# Patient Record
Sex: Female | Born: 1950 | Race: White | Hispanic: No | Marital: Married | Smoking: Current every day smoker
Health system: Southern US, Community
[De-identification: ages and names within clinical notes are randomized; demographics above are authoritative.]

## PROBLEM LIST (undated history)

## (undated) DIAGNOSIS — M1990 Unspecified osteoarthritis, unspecified site: Secondary | ICD-10-CM

## (undated) DIAGNOSIS — E079 Disorder of thyroid, unspecified: Secondary | ICD-10-CM

## (undated) DIAGNOSIS — C50919 Malignant neoplasm of unspecified site of unspecified female breast: Secondary | ICD-10-CM

## (undated) DIAGNOSIS — Z923 Personal history of irradiation: Secondary | ICD-10-CM

## (undated) DIAGNOSIS — H16209 Unspecified keratoconjunctivitis, unspecified eye: Secondary | ICD-10-CM

## (undated) DIAGNOSIS — F419 Anxiety disorder, unspecified: Secondary | ICD-10-CM

## (undated) DIAGNOSIS — J449 Chronic obstructive pulmonary disease, unspecified: Secondary | ICD-10-CM

## (undated) DIAGNOSIS — J329 Chronic sinusitis, unspecified: Secondary | ICD-10-CM

## (undated) DIAGNOSIS — C801 Malignant (primary) neoplasm, unspecified: Secondary | ICD-10-CM

## (undated) DIAGNOSIS — E039 Hypothyroidism, unspecified: Secondary | ICD-10-CM

## (undated) DIAGNOSIS — F329 Major depressive disorder, single episode, unspecified: Secondary | ICD-10-CM

## (undated) DIAGNOSIS — F32A Depression, unspecified: Secondary | ICD-10-CM

## (undated) HISTORY — DX: Malignant neoplasm of unspecified site of unspecified female breast: C50.919

## (undated) HISTORY — DX: Chronic obstructive pulmonary disease, unspecified: J44.9

## (undated) HISTORY — PX: ABDOMINAL HYSTERECTOMY: SHX81

## (undated) HISTORY — PX: TOTAL HIP ARTHROPLASTY: SHX124

## (undated) HISTORY — DX: Anxiety disorder, unspecified: F41.9

## (undated) HISTORY — DX: Unspecified osteoarthritis, unspecified site: M19.90

## (undated) HISTORY — PX: BREAST SURGERY: SHX581

## (undated) HISTORY — DX: Chronic sinusitis, unspecified: J32.9

## (undated) HISTORY — DX: Major depressive disorder, single episode, unspecified: F32.9

## (undated) HISTORY — DX: Depression, unspecified: F32.A

## (undated) HISTORY — PX: ECTOPIC PREGNANCY SURGERY: SHX613

## (undated) HISTORY — DX: Unspecified keratoconjunctivitis, unspecified eye: H16.209

---

## 2001-10-24 ENCOUNTER — Encounter: Admission: RE | Admit: 2001-10-24 | Discharge: 2001-10-24 | Payer: Self-pay | Admitting: Internal Medicine

## 2001-10-24 ENCOUNTER — Encounter: Payer: Self-pay | Admitting: Internal Medicine

## 2001-10-31 ENCOUNTER — Ambulatory Visit (HOSPITAL_COMMUNITY): Admission: RE | Admit: 2001-10-31 | Discharge: 2001-10-31 | Payer: Self-pay | Admitting: Internal Medicine

## 2001-10-31 ENCOUNTER — Encounter: Payer: Self-pay | Admitting: Internal Medicine

## 2001-11-06 ENCOUNTER — Encounter: Payer: Self-pay | Admitting: Internal Medicine

## 2001-11-06 ENCOUNTER — Ambulatory Visit (HOSPITAL_COMMUNITY): Admission: RE | Admit: 2001-11-06 | Discharge: 2001-11-06 | Payer: Self-pay | Admitting: Internal Medicine

## 2001-11-09 ENCOUNTER — Encounter: Payer: Self-pay | Admitting: Internal Medicine

## 2001-11-09 ENCOUNTER — Encounter: Admission: RE | Admit: 2001-11-09 | Discharge: 2001-11-09 | Payer: Self-pay | Admitting: Internal Medicine

## 2002-05-22 ENCOUNTER — Encounter: Payer: Self-pay | Admitting: Internal Medicine

## 2002-05-22 ENCOUNTER — Ambulatory Visit (HOSPITAL_COMMUNITY): Admission: RE | Admit: 2002-05-22 | Discharge: 2002-05-22 | Payer: Self-pay | Admitting: Internal Medicine

## 2002-07-17 ENCOUNTER — Ambulatory Visit (HOSPITAL_COMMUNITY): Admission: RE | Admit: 2002-07-17 | Discharge: 2002-07-17 | Payer: Self-pay | Admitting: Internal Medicine

## 2002-07-17 ENCOUNTER — Encounter: Payer: Self-pay | Admitting: Internal Medicine

## 2003-08-19 ENCOUNTER — Encounter: Admission: RE | Admit: 2003-08-19 | Discharge: 2003-08-19 | Payer: Self-pay | Admitting: Internal Medicine

## 2003-12-14 ENCOUNTER — Emergency Department (HOSPITAL_COMMUNITY): Admission: EM | Admit: 2003-12-14 | Discharge: 2003-12-14 | Payer: Self-pay | Admitting: Emergency Medicine

## 2004-09-14 ENCOUNTER — Emergency Department (HOSPITAL_COMMUNITY): Admission: EM | Admit: 2004-09-14 | Discharge: 2004-09-15 | Payer: Self-pay | Admitting: Emergency Medicine

## 2004-10-07 ENCOUNTER — Encounter: Admission: RE | Admit: 2004-10-07 | Discharge: 2004-10-07 | Payer: Self-pay | Admitting: Internal Medicine

## 2004-12-17 ENCOUNTER — Ambulatory Visit (HOSPITAL_COMMUNITY): Admission: RE | Admit: 2004-12-17 | Discharge: 2004-12-17 | Payer: Self-pay | Admitting: Orthopedic Surgery

## 2004-12-24 ENCOUNTER — Encounter: Admission: RE | Admit: 2004-12-24 | Discharge: 2004-12-24 | Payer: Self-pay | Admitting: Orthopedic Surgery

## 2005-08-30 ENCOUNTER — Encounter: Admission: RE | Admit: 2005-08-30 | Discharge: 2005-08-30 | Payer: Self-pay | Admitting: Orthopedic Surgery

## 2006-09-11 ENCOUNTER — Emergency Department (HOSPITAL_COMMUNITY): Admission: EM | Admit: 2006-09-11 | Discharge: 2006-09-11 | Payer: Self-pay | Admitting: Emergency Medicine

## 2006-10-12 ENCOUNTER — Ambulatory Visit (HOSPITAL_COMMUNITY): Admission: RE | Admit: 2006-10-12 | Discharge: 2006-10-12 | Payer: Self-pay | Admitting: Internal Medicine

## 2006-11-04 ENCOUNTER — Encounter (INDEPENDENT_AMBULATORY_CARE_PROVIDER_SITE_OTHER): Payer: Self-pay | Admitting: Diagnostic Radiology

## 2006-11-04 ENCOUNTER — Encounter: Admission: RE | Admit: 2006-11-04 | Discharge: 2006-11-04 | Payer: Self-pay | Admitting: Obstetrics and Gynecology

## 2006-11-13 ENCOUNTER — Encounter: Admission: RE | Admit: 2006-11-13 | Discharge: 2006-11-13 | Payer: Self-pay | Admitting: Obstetrics and Gynecology

## 2006-11-23 ENCOUNTER — Encounter: Admission: RE | Admit: 2006-11-23 | Discharge: 2006-11-23 | Payer: Self-pay | Admitting: Surgery

## 2006-11-25 ENCOUNTER — Encounter (INDEPENDENT_AMBULATORY_CARE_PROVIDER_SITE_OTHER): Payer: Self-pay | Admitting: Surgery

## 2006-11-25 ENCOUNTER — Encounter: Admission: RE | Admit: 2006-11-25 | Discharge: 2006-11-25 | Payer: Self-pay | Admitting: Surgery

## 2006-11-25 ENCOUNTER — Ambulatory Visit (HOSPITAL_BASED_OUTPATIENT_CLINIC_OR_DEPARTMENT_OTHER): Admission: RE | Admit: 2006-11-25 | Discharge: 2006-11-25 | Payer: Self-pay | Admitting: Surgery

## 2006-11-25 HISTORY — PX: BREAST LUMPECTOMY: SHX2

## 2006-12-07 ENCOUNTER — Ambulatory Visit: Payer: Self-pay | Admitting: Hematology and Oncology

## 2006-12-13 ENCOUNTER — Ambulatory Visit: Admission: RE | Admit: 2006-12-13 | Discharge: 2007-03-10 | Payer: Self-pay | Admitting: Radiation Oncology

## 2006-12-16 LAB — COMPREHENSIVE METABOLIC PANEL
ALT: 19 U/L (ref 0–35)
AST: 17 U/L (ref 0–37)
Albumin: 4.2 g/dL (ref 3.5–5.2)
Alkaline Phosphatase: 119 U/L — ABNORMAL HIGH (ref 39–117)
Glucose, Bld: 105 mg/dL — ABNORMAL HIGH (ref 70–99)
Potassium: 4.4 mEq/L (ref 3.5–5.3)
Sodium: 140 mEq/L (ref 135–145)
Total Bilirubin: 0.3 mg/dL (ref 0.3–1.2)
Total Protein: 6.8 g/dL (ref 6.0–8.3)

## 2006-12-16 LAB — CBC WITH DIFFERENTIAL/PLATELET
BASO%: 0.5 % (ref 0.0–2.0)
EOS%: 0 % (ref 0.0–7.0)
Eosinophils Absolute: 0 10*3/uL (ref 0.0–0.5)
LYMPH%: 28.9 % (ref 14.0–48.0)
MCHC: 35.2 g/dL (ref 32.0–36.0)
MCV: 88.3 fL (ref 81.0–101.0)
MONO%: 9.1 % (ref 0.0–13.0)
NEUT#: 3.9 10*3/uL (ref 1.5–6.5)
Platelets: 221 10*3/uL (ref 145–400)
RBC: 4.43 10*6/uL (ref 3.70–5.32)
RDW: 13.6 % (ref 11.3–14.5)

## 2006-12-22 ENCOUNTER — Ambulatory Visit (HOSPITAL_COMMUNITY): Admission: RE | Admit: 2006-12-22 | Discharge: 2006-12-22 | Payer: Self-pay | Admitting: Hematology and Oncology

## 2007-04-12 ENCOUNTER — Ambulatory Visit: Payer: Self-pay | Admitting: Hematology and Oncology

## 2007-04-14 LAB — CBC WITH DIFFERENTIAL/PLATELET
BASO%: 0.4 % (ref 0.0–2.0)
Basophils Absolute: 0 10*3/uL (ref 0.0–0.1)
EOS%: 0 % (ref 0.0–7.0)
HCT: 41 % (ref 34.8–46.6)
HGB: 13.9 g/dL (ref 11.6–15.9)
LYMPH%: 24.7 % (ref 14.0–48.0)
MCH: 30.3 pg (ref 26.0–34.0)
MCHC: 33.9 g/dL (ref 32.0–36.0)
NEUT%: 66.7 % (ref 39.6–76.8)
Platelets: 218 10*3/uL (ref 145–400)
lymph#: 1.3 10*3/uL (ref 0.9–3.3)

## 2007-04-14 LAB — COMPREHENSIVE METABOLIC PANEL
ALT: <8 U/L (ref 0–35)
AST: 10 U/L (ref 0–37)
BUN: 15 mg/dL (ref 6–23)
CO2: 27 mEq/L (ref 19–32)
Calcium: 8.9 mg/dL (ref 8.4–10.5)
Chloride: 105 mEq/L (ref 96–112)
Creatinine, Ser: 0.64 mg/dL (ref 0.40–1.20)
Total Bilirubin: 0.3 mg/dL (ref 0.3–1.2)

## 2007-04-14 LAB — LACTATE DEHYDROGENASE: LDH: 149 U/L (ref 94–250)

## 2007-05-23 ENCOUNTER — Emergency Department (HOSPITAL_COMMUNITY): Admission: EM | Admit: 2007-05-23 | Discharge: 2007-05-23 | Payer: Self-pay | Admitting: Family Medicine

## 2007-07-11 ENCOUNTER — Ambulatory Visit: Payer: Self-pay | Admitting: Hematology and Oncology

## 2007-07-13 LAB — CBC WITH DIFFERENTIAL/PLATELET
BASO%: 0.2 % (ref 0.0–2.0)
Eosinophils Absolute: 0 10*3/uL (ref 0.0–0.5)
MCHC: 34.3 g/dL (ref 32.0–36.0)
MONO#: 0.5 10*3/uL (ref 0.1–0.9)
MONO%: 8.1 % (ref 0.0–13.0)
NEUT#: 4.5 10*3/uL (ref 1.5–6.5)
RBC: 4.67 10*6/uL (ref 3.70–5.32)
RDW: 14.2 % (ref 11.3–14.5)
WBC: 6.2 10*3/uL (ref 3.9–10.0)

## 2007-07-13 LAB — COMPREHENSIVE METABOLIC PANEL
ALT: 10 U/L (ref 0–35)
Albumin: 4.1 g/dL (ref 3.5–5.2)
Alkaline Phosphatase: 91 U/L (ref 39–117)
CO2: 26 mEq/L (ref 19–32)
Glucose, Bld: 94 mg/dL (ref 70–99)
Potassium: 3.5 mEq/L (ref 3.5–5.3)
Sodium: 141 mEq/L (ref 135–145)
Total Protein: 6.9 g/dL (ref 6.0–8.3)

## 2007-07-26 ENCOUNTER — Emergency Department (HOSPITAL_COMMUNITY): Admission: EM | Admit: 2007-07-26 | Discharge: 2007-07-26 | Payer: Self-pay | Admitting: Family Medicine

## 2007-10-08 ENCOUNTER — Emergency Department (HOSPITAL_COMMUNITY): Admission: EM | Admit: 2007-10-08 | Discharge: 2007-10-08 | Payer: Self-pay | Admitting: Emergency Medicine

## 2007-10-13 ENCOUNTER — Encounter: Admission: RE | Admit: 2007-10-13 | Discharge: 2007-10-13 | Payer: Self-pay | Admitting: Surgery

## 2007-11-07 ENCOUNTER — Ambulatory Visit: Payer: Self-pay | Admitting: Hematology and Oncology

## 2008-01-16 ENCOUNTER — Ambulatory Visit: Payer: Self-pay | Admitting: Hematology and Oncology

## 2008-01-18 LAB — COMPREHENSIVE METABOLIC PANEL
Albumin: 4.3 g/dL (ref 3.5–5.2)
CO2: 23 mEq/L (ref 19–32)
Chloride: 107 mEq/L (ref 96–112)
Glucose, Bld: 102 mg/dL — ABNORMAL HIGH (ref 70–99)
Potassium: 4.2 mEq/L (ref 3.5–5.3)
Sodium: 140 mEq/L (ref 135–145)
Total Protein: 6.8 g/dL (ref 6.0–8.3)

## 2008-01-18 LAB — CBC WITH DIFFERENTIAL/PLATELET
Eosinophils Absolute: 0 10*3/uL (ref 0.0–0.5)
HCT: 40.4 % (ref 34.8–46.6)
HGB: 14 g/dL (ref 11.6–15.9)
MCH: 31.8 pg (ref 26.0–34.0)
MCHC: 34.6 g/dL (ref 32.0–36.0)
MCV: 91.7 fL (ref 81.0–101.0)
MONO#: 0.5 10*3/uL (ref 0.1–0.9)

## 2008-03-16 ENCOUNTER — Emergency Department (HOSPITAL_COMMUNITY): Admission: EM | Admit: 2008-03-16 | Discharge: 2008-03-16 | Payer: Self-pay | Admitting: Emergency Medicine

## 2008-05-16 ENCOUNTER — Encounter: Admission: RE | Admit: 2008-05-16 | Discharge: 2008-05-16 | Payer: Self-pay | Admitting: Internal Medicine

## 2008-07-15 ENCOUNTER — Ambulatory Visit: Payer: Self-pay | Admitting: Hematology and Oncology

## 2008-11-28 ENCOUNTER — Ambulatory Visit: Payer: Self-pay | Admitting: Oncology

## 2008-12-18 ENCOUNTER — Encounter: Admission: RE | Admit: 2008-12-18 | Discharge: 2008-12-18 | Payer: Self-pay | Admitting: Hematology and Oncology

## 2008-12-25 ENCOUNTER — Ambulatory Visit: Payer: Self-pay | Admitting: Oncology

## 2009-01-23 ENCOUNTER — Ambulatory Visit: Payer: Self-pay | Admitting: Oncology

## 2009-03-07 ENCOUNTER — Encounter: Payer: Self-pay | Admitting: Physician Assistant

## 2009-03-26 ENCOUNTER — Ambulatory Visit: Payer: Self-pay | Admitting: Oncology

## 2009-04-09 LAB — COMPREHENSIVE METABOLIC PANEL
ALT: 12 U/L (ref 0–35)
AST: 20 U/L (ref 0–37)
Alkaline Phosphatase: 78 U/L (ref 39–117)
Potassium: 3.5 mEq/L (ref 3.5–5.3)
Sodium: 137 mEq/L (ref 135–145)
Total Bilirubin: 0.2 mg/dL — ABNORMAL LOW (ref 0.3–1.2)
Total Protein: 6.4 g/dL (ref 6.0–8.3)

## 2009-04-09 LAB — CBC WITH DIFFERENTIAL/PLATELET
BASO%: 0.3 % (ref 0.0–2.0)
LYMPH%: 27.7 % (ref 14.0–49.7)
MCHC: 34.3 g/dL (ref 31.5–36.0)
MCV: 91.1 fL (ref 79.5–101.0)
MONO%: 7.6 % (ref 0.0–14.0)
Platelets: 175 10*3/uL (ref 145–400)
RBC: 4.22 10*6/uL (ref 3.70–5.45)
RDW: 13.1 % (ref 11.2–14.5)
WBC: 5.7 10*3/uL (ref 3.9–10.3)

## 2009-05-20 ENCOUNTER — Ambulatory Visit: Payer: Self-pay | Admitting: Physician Assistant

## 2009-05-20 DIAGNOSIS — F411 Generalized anxiety disorder: Secondary | ICD-10-CM | POA: Insufficient documentation

## 2009-05-20 DIAGNOSIS — M169 Osteoarthritis of hip, unspecified: Secondary | ICD-10-CM | POA: Insufficient documentation

## 2009-05-20 DIAGNOSIS — J441 Chronic obstructive pulmonary disease with (acute) exacerbation: Secondary | ICD-10-CM | POA: Insufficient documentation

## 2009-05-20 DIAGNOSIS — E785 Hyperlipidemia, unspecified: Secondary | ICD-10-CM | POA: Insufficient documentation

## 2009-05-20 DIAGNOSIS — E018 Other iodine-deficiency related thyroid disorders and allied conditions: Secondary | ICD-10-CM | POA: Insufficient documentation

## 2009-05-20 LAB — CONVERTED CEMR LAB
Alkaline Phosphatase: 78 units/L (ref 39–117)
Basophils Absolute: 0 10*3/uL (ref 0.0–0.1)
Creatinine, Ser: 0.59 mg/dL (ref 0.40–1.20)
Eosinophils Absolute: 0 10*3/uL (ref 0.0–0.7)
Lymphocytes Relative: 25 % (ref 12–46)
MCV: 90.4 fL (ref 78.0–100.0)
Monocytes Absolute: 0.8 10*3/uL (ref 0.1–1.0)
Monocytes Relative: 10 % (ref 3–12)
Neutrophils Relative %: 65 % (ref 43–77)
Platelets: 249 10*3/uL (ref 150–400)
RBC: 4.79 M/uL (ref 3.87–5.11)
RDW: 13.5 % (ref 11.5–15.5)
Sodium: 145 meq/L (ref 135–145)
TSH: 0.05 microintl units/mL — ABNORMAL LOW (ref 0.350–4.500)

## 2009-05-21 ENCOUNTER — Encounter: Payer: Self-pay | Admitting: Physician Assistant

## 2009-05-21 LAB — CONVERTED CEMR LAB
Amphetamine Screen, Ur: NEGATIVE
Benzodiazepines.: NEGATIVE
Cocaine Metabolites: NEGATIVE
Creatinine,U: 92.7 mg/dL
Phencyclidine (PCP): NEGATIVE

## 2009-05-22 ENCOUNTER — Telehealth: Payer: Self-pay | Admitting: Physician Assistant

## 2009-05-23 ENCOUNTER — Encounter: Payer: Self-pay | Admitting: Physician Assistant

## 2009-05-23 ENCOUNTER — Ambulatory Visit (HOSPITAL_COMMUNITY): Admission: RE | Admit: 2009-05-23 | Discharge: 2009-05-23 | Payer: Self-pay | Admitting: Physician Assistant

## 2009-05-27 ENCOUNTER — Encounter: Payer: Self-pay | Admitting: Physician Assistant

## 2009-06-04 ENCOUNTER — Ambulatory Visit: Payer: Self-pay | Admitting: Physician Assistant

## 2009-06-06 ENCOUNTER — Encounter: Payer: Self-pay | Admitting: Physician Assistant

## 2009-06-12 ENCOUNTER — Ambulatory Visit (HOSPITAL_COMMUNITY): Admission: RE | Admit: 2009-06-12 | Discharge: 2009-06-12 | Payer: Self-pay | Admitting: Internal Medicine

## 2009-06-12 ENCOUNTER — Ambulatory Visit: Payer: Self-pay | Admitting: Physician Assistant

## 2009-06-16 ENCOUNTER — Encounter: Payer: Self-pay | Admitting: Physician Assistant

## 2009-06-17 ENCOUNTER — Telehealth: Payer: Self-pay | Admitting: Physician Assistant

## 2009-06-17 ENCOUNTER — Encounter: Payer: Self-pay | Admitting: Physician Assistant

## 2009-06-19 ENCOUNTER — Telehealth: Payer: Self-pay | Admitting: Physician Assistant

## 2009-06-19 IMAGING — MG MM DIGITAL SCREENING BILAT
4 series · 4 of 4 positions shown · non-contrast
Comparison: none

DG SCREEN MAMMOGRAM BILATERAL
Bilateral CC and MLO view(s) were taken.

DIGITAL SCREENING MAMMOGRAM WITH CAD:
There is a fibroglandular pattern.  Microcalcifications are present in the right breast.  
Characterization with magnification views is recommended.  No mass or malignant type calcifications
are identified in the left breast.  Compared with prior studies.

[R CC]
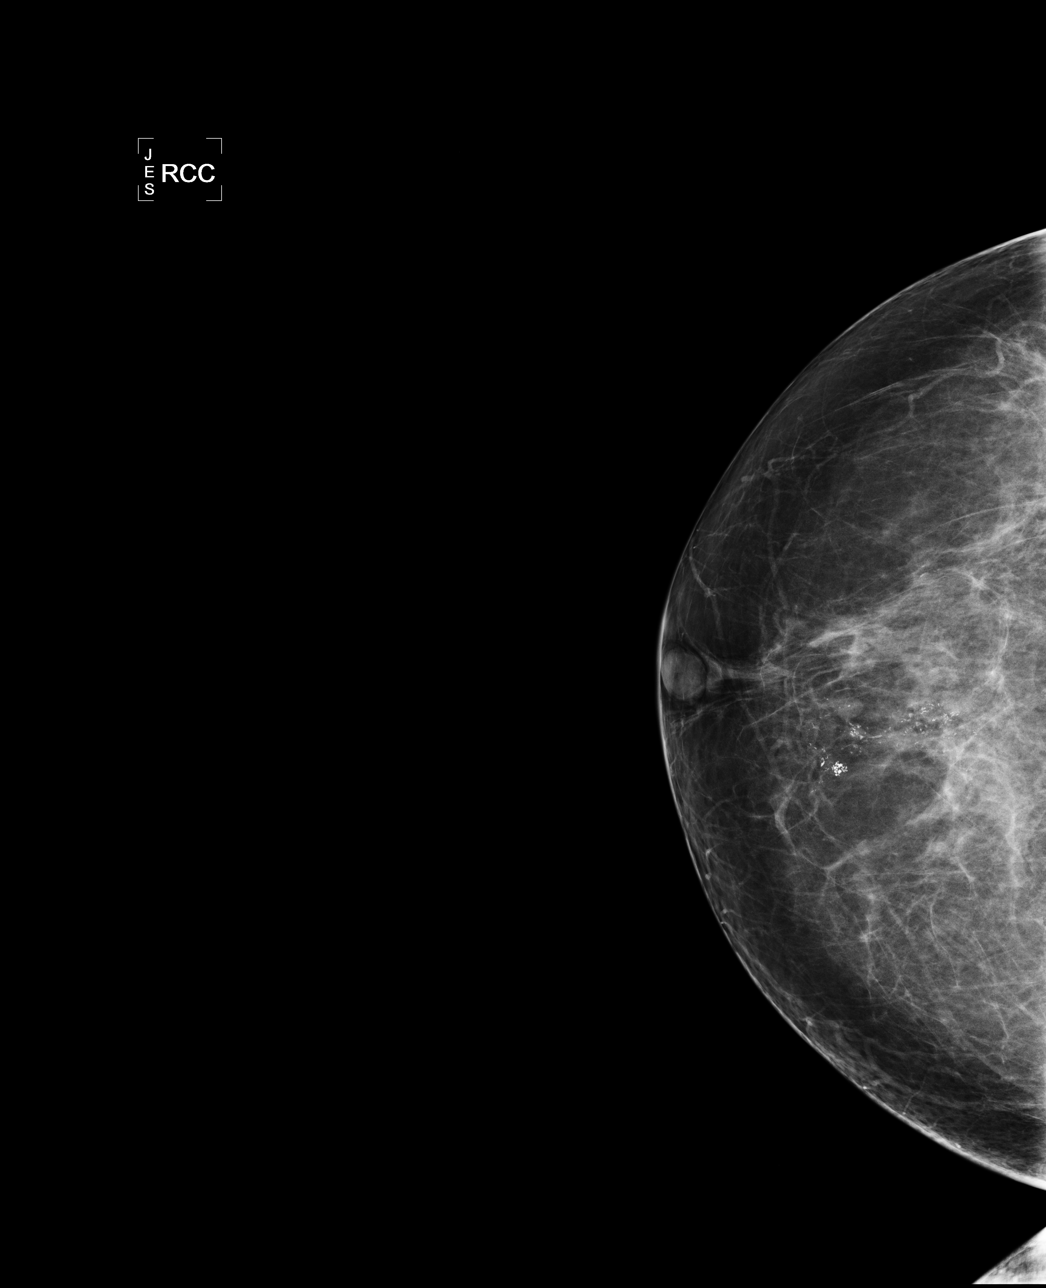

[R MLO]
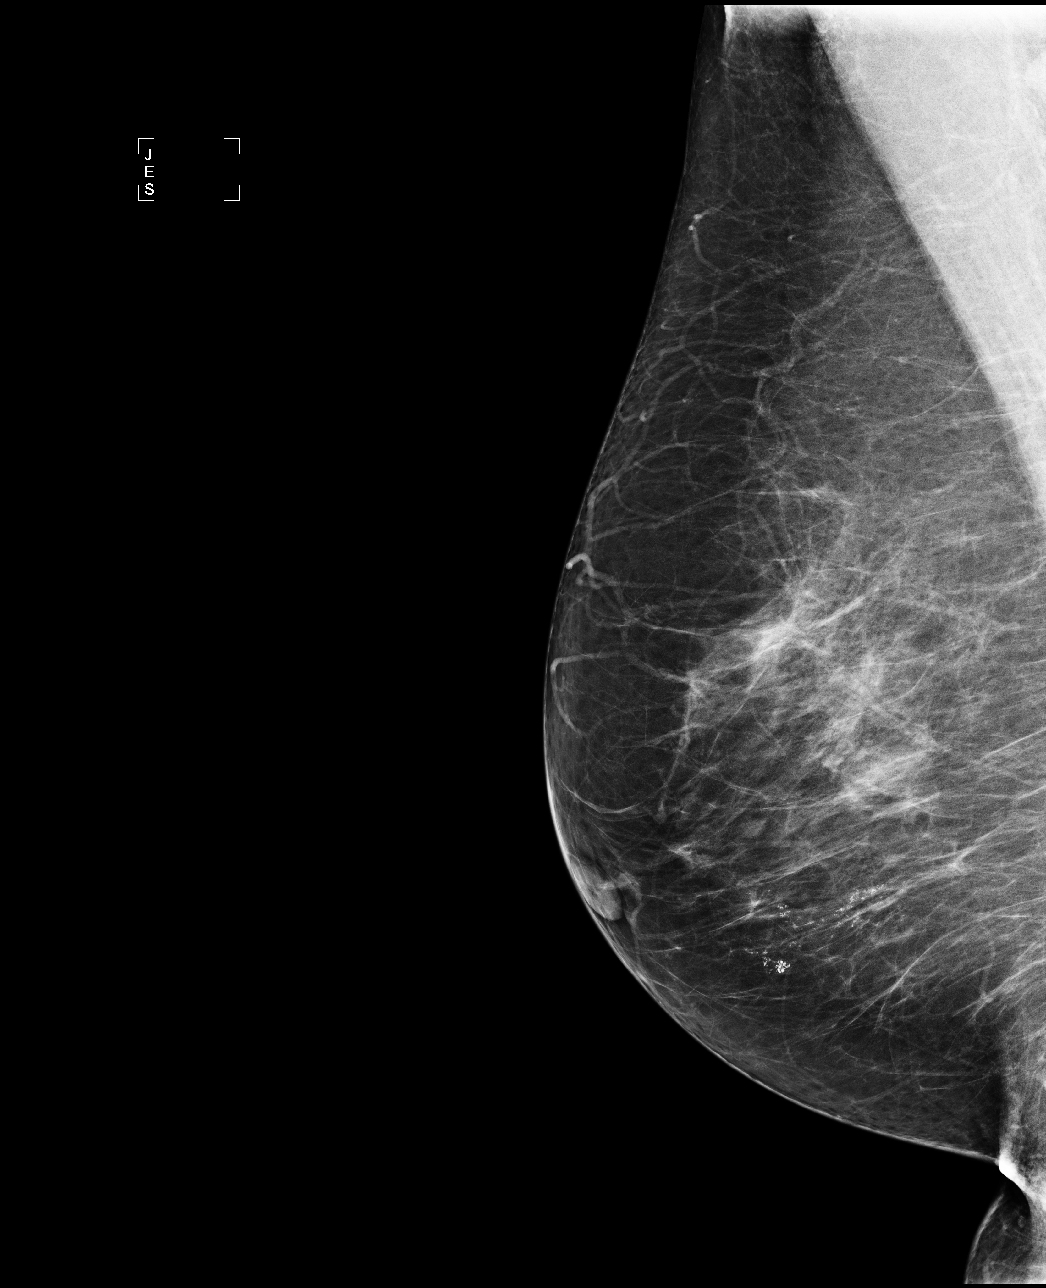

[L CC]
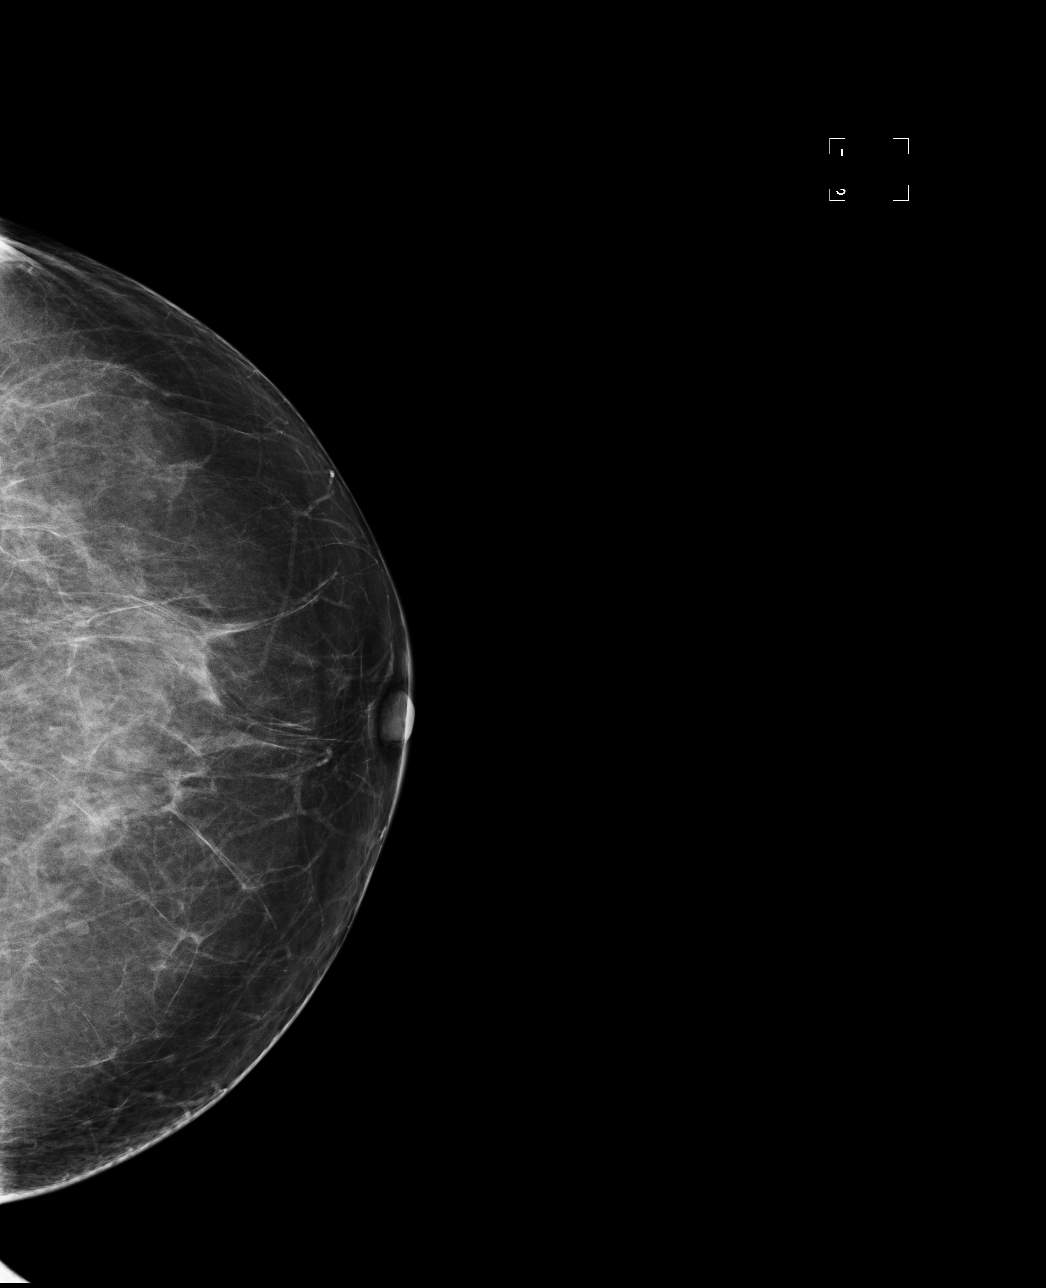

[L MLO]
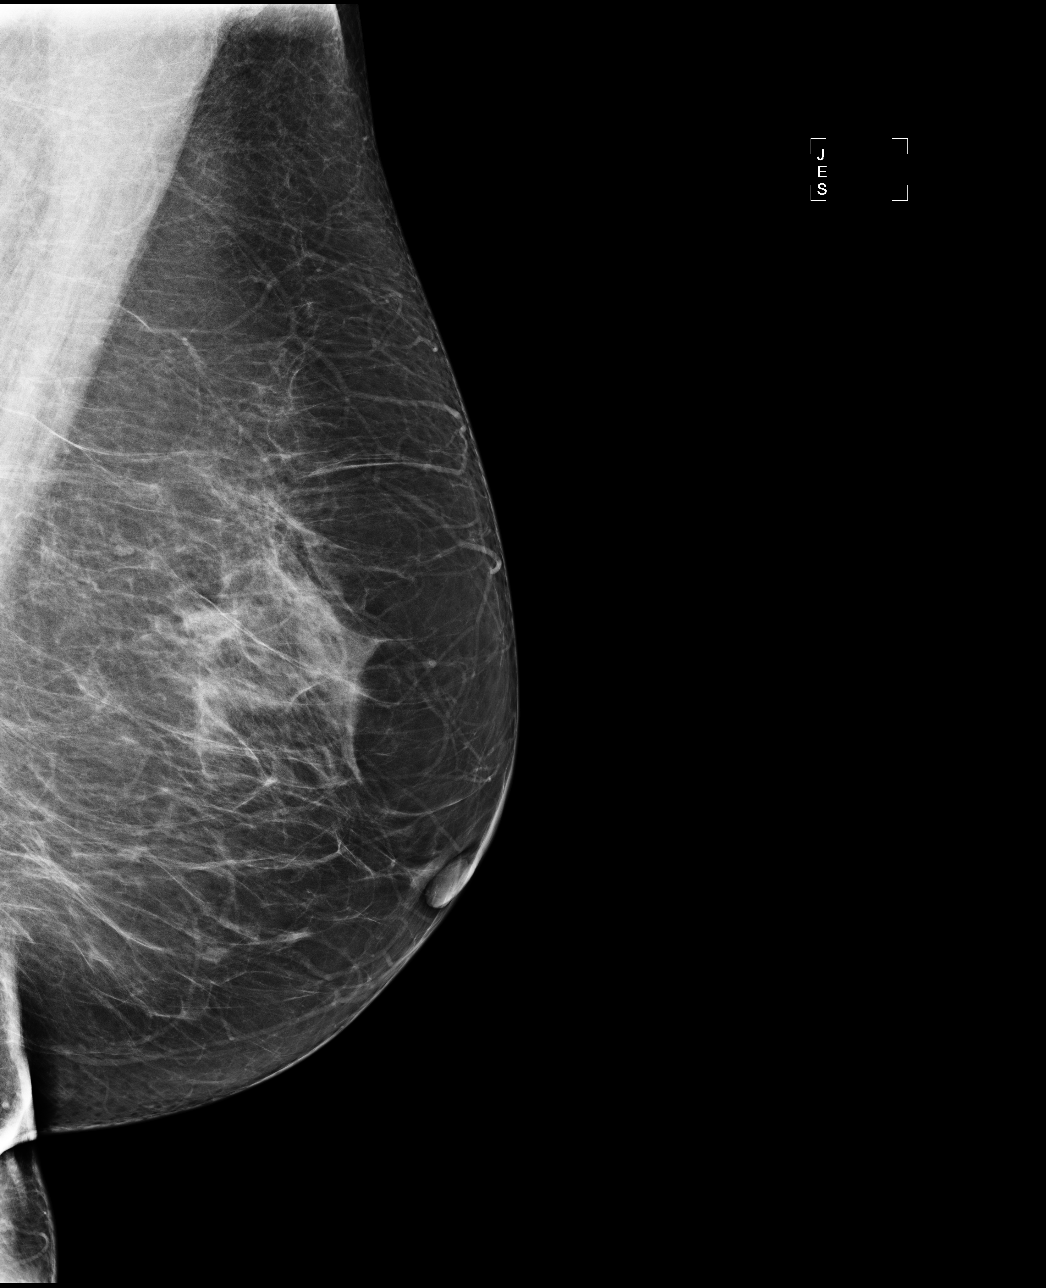

[4 of 4 positions shown; findings below may reference images not displayed]

IMPRESSION: Calcifications, right breast.  Additional evaluation is indicated. The patient will be contacted 
for additional studies and a supplementary report will follow.  No specific mammographic evidence 
of malignancy, left breast.

ASSESSMENT: Need additional imaging evaluation and/or prior mammograms for comparison - BI-RADS 0

Further imaging of the right breast.
ANALYZED BY COMPUTER AIDED DETECTION. , THIS PROCEDURE WAS A DIGITAL MAMMOGRAM.

## 2009-06-20 ENCOUNTER — Telehealth: Payer: Self-pay | Admitting: Physician Assistant

## 2009-06-27 ENCOUNTER — Encounter: Payer: Self-pay | Admitting: Physician Assistant

## 2009-07-07 ENCOUNTER — Telehealth: Payer: Self-pay | Admitting: Physician Assistant

## 2009-07-15 ENCOUNTER — Ambulatory Visit: Payer: Self-pay | Admitting: Physician Assistant

## 2009-07-15 ENCOUNTER — Other Ambulatory Visit: Admission: RE | Admit: 2009-07-15 | Discharge: 2009-07-15 | Payer: Self-pay | Admitting: Internal Medicine

## 2009-07-15 DIAGNOSIS — R82998 Other abnormal findings in urine: Secondary | ICD-10-CM | POA: Insufficient documentation

## 2009-07-15 LAB — CONVERTED CEMR LAB
Bilirubin Urine: NEGATIVE
KOH Prep: NEGATIVE
OCCULT 1: NEGATIVE
Pap Smear: NEGATIVE
Specific Gravity, Urine: 1.01
Urobilinogen, UA: 0.2

## 2009-07-16 ENCOUNTER — Encounter: Payer: Self-pay | Admitting: Physician Assistant

## 2009-07-16 LAB — CONVERTED CEMR LAB
Casts: NONE SEEN /lpf
Free T4: 1.02 ng/dL (ref 0.80–1.80)
Ketones, ur: NEGATIVE mg/dL
Specific Gravity, Urine: 1.012 (ref 1.005–1.030)
TSH: 0.084 microintl units/mL — ABNORMAL LOW (ref 0.350–4.500)
Urine Glucose: NEGATIVE mg/dL
pH: 6.5 (ref 5.0–8.0)

## 2009-07-17 ENCOUNTER — Telehealth: Payer: Self-pay | Admitting: Physician Assistant

## 2009-07-17 ENCOUNTER — Encounter: Payer: Self-pay | Admitting: Physician Assistant

## 2009-07-21 ENCOUNTER — Encounter: Payer: Self-pay | Admitting: Physician Assistant

## 2009-07-21 ENCOUNTER — Inpatient Hospital Stay (HOSPITAL_COMMUNITY): Admission: RE | Admit: 2009-07-21 | Discharge: 2009-07-23 | Payer: Self-pay | Admitting: Orthopedic Surgery

## 2009-07-25 ENCOUNTER — Encounter: Payer: Self-pay | Admitting: Physician Assistant

## 2009-08-13 ENCOUNTER — Telehealth: Payer: Self-pay | Admitting: Physician Assistant

## 2009-08-14 ENCOUNTER — Telehealth: Payer: Self-pay | Admitting: Physician Assistant

## 2009-08-21 ENCOUNTER — Telehealth: Payer: Self-pay | Admitting: Physician Assistant

## 2009-08-22 ENCOUNTER — Encounter: Payer: Self-pay | Admitting: Physician Assistant

## 2009-08-25 ENCOUNTER — Ambulatory Visit: Payer: Self-pay | Admitting: Physician Assistant

## 2009-08-27 ENCOUNTER — Encounter: Admission: RE | Admit: 2009-08-27 | Discharge: 2009-09-25 | Payer: Self-pay | Admitting: Orthopedic Surgery

## 2009-08-27 LAB — CONVERTED CEMR LAB
Cholesterol: 231 mg/dL — ABNORMAL HIGH (ref 0–200)
HDL goal, serum: 40 mg/dL
LDL Goal: 130 mg/dL
Triglycerides: 145 mg/dL (ref <150)
VLDL: 29 mg/dL (ref 0–40)

## 2009-09-18 ENCOUNTER — Telehealth: Payer: Self-pay | Admitting: Physician Assistant

## 2009-10-06 ENCOUNTER — Ambulatory Visit: Payer: Self-pay | Admitting: Oncology

## 2009-10-08 ENCOUNTER — Telehealth: Payer: Self-pay | Admitting: Physician Assistant

## 2009-10-08 LAB — COMPREHENSIVE METABOLIC PANEL
Albumin: 3.9 g/dL (ref 3.5–5.2)
BUN: 11 mg/dL (ref 6–23)
CO2: 25 mEq/L (ref 19–32)
Calcium: 9.2 mg/dL (ref 8.4–10.5)
Chloride: 104 mEq/L (ref 96–112)
Glucose, Bld: 96 mg/dL (ref 70–99)
Potassium: 3.8 mEq/L (ref 3.5–5.3)

## 2009-10-08 LAB — CBC WITH DIFFERENTIAL/PLATELET
Basophils Absolute: 0.1 10*3/uL (ref 0.0–0.1)
Eosinophils Absolute: 0 10*3/uL (ref 0.0–0.5)
HGB: 13.9 g/dL (ref 11.6–15.9)
MCV: 86.8 fL (ref 79.5–101.0)
MONO#: 0.8 10*3/uL (ref 0.1–0.9)
MONO%: 8.9 % (ref 0.0–14.0)
NEUT#: 5.7 10*3/uL (ref 1.5–6.5)
RDW: 14 % (ref 11.2–14.5)
lymph#: 2.1 10*3/uL (ref 0.9–3.3)

## 2009-10-08 LAB — CANCER ANTIGEN 27.29: CA 27.29: 10 U/mL (ref 0–39)

## 2009-10-09 ENCOUNTER — Telehealth: Payer: Self-pay | Admitting: Physician Assistant

## 2009-10-20 ENCOUNTER — Telehealth: Payer: Self-pay | Admitting: Physician Assistant

## 2009-10-27 ENCOUNTER — Telehealth: Payer: Self-pay | Admitting: Physician Assistant

## 2009-12-02 ENCOUNTER — Ambulatory Visit: Payer: Self-pay | Admitting: Physician Assistant

## 2009-12-02 DIAGNOSIS — L509 Urticaria, unspecified: Secondary | ICD-10-CM | POA: Insufficient documentation

## 2009-12-03 ENCOUNTER — Telehealth: Payer: Self-pay | Admitting: Physician Assistant

## 2009-12-03 ENCOUNTER — Encounter: Payer: Self-pay | Admitting: Physician Assistant

## 2009-12-03 LAB — CONVERTED CEMR LAB
ALT: <8 units/L (ref 0–35)
AST: 14 units/L (ref 0–37)
CO2: 27 meq/L (ref 19–32)
Calcium: 8.9 mg/dL (ref 8.4–10.5)
Chloride: 107 meq/L (ref 96–112)
Cholesterol: 175 mg/dL (ref 0–200)
Creatinine, Ser: 0.73 mg/dL (ref 0.40–1.20)
Free T4: 1.06 ng/dL (ref 0.80–1.80)
Sodium: 142 meq/L (ref 135–145)
T3, Free: 3.3 pg/mL (ref 2.3–4.2)
TSH: 1.216 microintl units/mL (ref 0.350–4.500)
Total CHOL/HDL Ratio: 3.7
Total Protein: 7.1 g/dL (ref 6.0–8.3)
VLDL: 23 mg/dL (ref 0–40)

## 2009-12-05 ENCOUNTER — Ambulatory Visit: Payer: Self-pay | Admitting: Internal Medicine

## 2009-12-05 DIAGNOSIS — R7309 Other abnormal glucose: Secondary | ICD-10-CM | POA: Insufficient documentation

## 2009-12-12 ENCOUNTER — Ambulatory Visit: Payer: Self-pay | Admitting: Physician Assistant

## 2009-12-22 ENCOUNTER — Emergency Department (HOSPITAL_COMMUNITY): Admission: EM | Admit: 2009-12-22 | Discharge: 2009-12-22 | Payer: Self-pay | Admitting: Family Medicine

## 2009-12-22 ENCOUNTER — Encounter: Admission: RE | Admit: 2009-12-22 | Discharge: 2009-12-22 | Payer: Self-pay | Admitting: Hematology and Oncology

## 2009-12-25 ENCOUNTER — Telehealth: Payer: Self-pay | Admitting: Physician Assistant

## 2009-12-25 ENCOUNTER — Ambulatory Visit: Payer: Self-pay | Admitting: Physician Assistant

## 2009-12-25 DIAGNOSIS — R51 Headache: Secondary | ICD-10-CM | POA: Insufficient documentation

## 2009-12-25 DIAGNOSIS — R519 Headache, unspecified: Secondary | ICD-10-CM | POA: Insufficient documentation

## 2009-12-29 ENCOUNTER — Encounter: Payer: Self-pay | Admitting: Physician Assistant

## 2010-01-27 ENCOUNTER — Encounter: Payer: Self-pay | Admitting: Physician Assistant

## 2010-02-11 ENCOUNTER — Telehealth: Payer: Self-pay | Admitting: Physician Assistant

## 2010-02-25 ENCOUNTER — Telehealth (INDEPENDENT_AMBULATORY_CARE_PROVIDER_SITE_OTHER): Payer: Self-pay | Admitting: Nurse Practitioner

## 2010-02-25 ENCOUNTER — Ambulatory Visit: Payer: Self-pay | Admitting: Nurse Practitioner

## 2010-02-25 ENCOUNTER — Encounter (INDEPENDENT_AMBULATORY_CARE_PROVIDER_SITE_OTHER): Payer: Self-pay | Admitting: Nurse Practitioner

## 2010-02-25 DIAGNOSIS — F17200 Nicotine dependence, unspecified, uncomplicated: Secondary | ICD-10-CM | POA: Insufficient documentation

## 2010-02-25 DIAGNOSIS — F329 Major depressive disorder, single episode, unspecified: Secondary | ICD-10-CM | POA: Insufficient documentation

## 2010-02-25 LAB — CONVERTED CEMR LAB: T3, Free: 2.8 pg/mL (ref 2.3–4.2)

## 2010-02-26 ENCOUNTER — Encounter (INDEPENDENT_AMBULATORY_CARE_PROVIDER_SITE_OTHER): Payer: Self-pay | Admitting: Nurse Practitioner

## 2010-03-02 ENCOUNTER — Ambulatory Visit: Payer: Self-pay | Admitting: Nurse Practitioner

## 2010-03-03 ENCOUNTER — Encounter (INDEPENDENT_AMBULATORY_CARE_PROVIDER_SITE_OTHER): Payer: Self-pay | Admitting: Nurse Practitioner

## 2010-03-11 ENCOUNTER — Telehealth (INDEPENDENT_AMBULATORY_CARE_PROVIDER_SITE_OTHER): Payer: Self-pay | Admitting: Nurse Practitioner

## 2010-03-12 ENCOUNTER — Telehealth (INDEPENDENT_AMBULATORY_CARE_PROVIDER_SITE_OTHER): Payer: Self-pay | Admitting: Nurse Practitioner

## 2010-03-23 ENCOUNTER — Encounter (INDEPENDENT_AMBULATORY_CARE_PROVIDER_SITE_OTHER): Payer: Self-pay | Admitting: Nurse Practitioner

## 2010-03-31 ENCOUNTER — Ambulatory Visit: Payer: Self-pay | Admitting: Hematology and Oncology

## 2010-04-02 LAB — COMPREHENSIVE METABOLIC PANEL
ALT: 8 U/L (ref 0–35)
AST: 12 U/L (ref 0–37)
Albumin: 4.2 g/dL (ref 3.5–5.2)
Alkaline Phosphatase: 77 U/L (ref 39–117)
BUN: 12 mg/dL (ref 6–23)
CO2: 24 mEq/L (ref 19–32)
Calcium: 8.9 mg/dL (ref 8.4–10.5)
Chloride: 105 mEq/L (ref 96–112)
Creatinine, Ser: 0.71 mg/dL (ref 0.40–1.20)
Glucose, Bld: 149 mg/dL — ABNORMAL HIGH (ref 70–99)
Potassium: 3.5 mEq/L (ref 3.5–5.3)
Sodium: 140 mEq/L (ref 135–145)
Total Bilirubin: 0.3 mg/dL (ref 0.3–1.2)
Total Protein: 6.7 g/dL (ref 6.0–8.3)

## 2010-04-02 LAB — CBC WITH DIFFERENTIAL/PLATELET
BASO%: 0 % (ref 0.0–2.0)
Basophils Absolute: 0 10*3/uL (ref 0.0–0.1)
EOS%: 0 % (ref 0.0–7.0)
Eosinophils Absolute: 0 10*3/uL (ref 0.0–0.5)
HCT: 38.5 % (ref 34.8–46.6)
HGB: 13.1 g/dL (ref 11.6–15.9)
LYMPH%: 7.5 % — ABNORMAL LOW (ref 14.0–49.7)
MCH: 30.3 pg (ref 25.1–34.0)
MCHC: 33.9 g/dL (ref 31.5–36.0)
MCV: 89.3 fL (ref 79.5–101.0)
MONO#: 0.2 10*3/uL (ref 0.1–0.9)
MONO%: 2.3 % (ref 0.0–14.0)
NEUT#: 7.1 10*3/uL — ABNORMAL HIGH (ref 1.5–6.5)
NEUT%: 90.2 % — ABNORMAL HIGH (ref 38.4–76.8)
Platelets: 185 10*3/uL (ref 145–400)
RBC: 4.31 10*6/uL (ref 3.70–5.45)
RDW: 14.2 % (ref 11.2–14.5)
WBC: 7.9 10*3/uL (ref 3.9–10.3)
lymph#: 0.6 10*3/uL — ABNORMAL LOW (ref 0.9–3.3)

## 2010-04-02 LAB — CANCER ANTIGEN 27.29: CA 27.29: 10 U/mL (ref 0–39)

## 2010-04-02 LAB — LACTATE DEHYDROGENASE: LDH: 169 U/L (ref 94–250)

## 2010-04-04 ENCOUNTER — Encounter: Payer: Self-pay | Admitting: Hematology and Oncology

## 2010-04-05 ENCOUNTER — Encounter: Payer: Self-pay | Admitting: Obstetrics and Gynecology

## 2010-04-09 ENCOUNTER — Encounter (INDEPENDENT_AMBULATORY_CARE_PROVIDER_SITE_OTHER): Payer: Self-pay | Admitting: Nurse Practitioner

## 2010-04-13 ENCOUNTER — Telehealth (INDEPENDENT_AMBULATORY_CARE_PROVIDER_SITE_OTHER): Payer: Self-pay | Admitting: Nurse Practitioner

## 2010-04-15 ENCOUNTER — Encounter (HOSPITAL_BASED_OUTPATIENT_CLINIC_OR_DEPARTMENT_OTHER): Payer: Medicaid Other | Admitting: Hematology and Oncology

## 2010-04-15 DIAGNOSIS — D0590 Unspecified type of carcinoma in situ of unspecified breast: Secondary | ICD-10-CM

## 2010-04-15 DIAGNOSIS — Z170 Estrogen receptor positive status [ER+]: Secondary | ICD-10-CM

## 2010-04-16 NOTE — Progress Notes (Signed)
Summary: Office Visit//DEPRESSION SCREENING  Office Visit//DEPRESSION SCREENING   Imported By: Arta Bruce 12/03/2009 12:55:55  _____________________________________________________________________  External Attachment:    Type:   Image     Comment:   External Document

## 2010-04-16 NOTE — Progress Notes (Signed)
Summary: Prior Authorization  Phone Note Call from Patient Call back at [facility] Phone [phone]   Summary of Call: pt states went to pharmacy and they stated she needed prior authorization for ambien...[facility]  Initial call taken by: Mikey College CMA,  June 19, 2009 12:54 PM  Follow-up for Phone Call        Rx in [place] basket to refax Follow-up by: Tereso Newcomer PA-C,  June 19, 2009 1:58 PM  Additional Follow-up for Phone Call Additional follow up Details #1::        called pharmacy and pt and they knows and have new rx.... [place] Shannon  June 20, 2009 12:32 PM

## 2010-04-16 NOTE — Letter (Signed)
Summary: PFT'S  PFT'S   Imported By: Arta Bruce 06/30/2009 10:25:14  _____________________________________________________________________  External Attachment:    Type:   Image     Comment:   External Document

## 2010-04-16 NOTE — Progress Notes (Signed)
Summary: med refill Alprazolam  Phone Note Refill Request   Refills Requested: Medication #1:  ALPRAZOLAM 0.5 MG TABS One tablet by mouth two times a day as needed for nerves pt did recive all her medicine but only one she did't get it alprazolam, [facility] [address]  Initial call taken by: Domenic Polite,  March 12, 2010 9:42 AM  Follow-up for Phone Call        notify pt med is due on 03/13/2010 it will be filled tomorrow. i have the refill request on my desk with the intent to fill on Friday Follow-up by: Lehman Prom FNP,  March 12, 2010 10:07 AM  Additional Follow-up for Phone Call Additional follow up Details #1::        Pt. notified.  Dutch Quint RN  March 12, 2010 10:14 AM

## 2010-04-16 NOTE — Progress Notes (Signed)
Summary: Allergy referral  Phone Note Outgoing Call   Summary of Call: Refer to Allergist for unexplained urticaria.  Initial call taken by: Brynda Rim,  December 25, 2009 4:51 PM  Follow-up for Phone Call        [facility]  01-09-10 @ 9AM LVM TO PT TO RETURN MY CALL  Follow-up by: Cheryll Dessert,  December 30, 2009 4:33 PM

## 2010-04-16 NOTE — Assessment & Plan Note (Signed)
Summary: Depression   Vital Signs:  Patient profile:   60 year old female Weight:      178.4 pounds BMI:     30.26 O2 Sat:      95 % on Room air Temp:     98.1 degrees F oral Pulse rate:   80 / minute Pulse rhythm:   regular Resp:     20 per minute BP sitting:   126 / 70  (left arm) Cuff size:   large  Vitals Entered By: Levon Hedger (February 25, 2010 2:10 PM)  Nutrition Counseling: Patient's BMI is greater than 25 and therefore counseled on weight management options.  O2 Flow:  Room air  Serial Vital Signs/Assessments:  Comments: p/f  200,  200,  230 By: Levon Hedger   CC: 2 month follow up visit allergy specialist...slight headache, Lipid Management, Depression Is Patient Diabetic? No Pain Assessment Patient in pain? no       Does patient need assistance? Functional Status Self care Ambulation Normal   Primary Care [place] [facility]:  Tereso Newcomer PA-C  CC:  2 month follow up visit allergy specialist...slight headache, Lipid Management, and Depression.  History of Present Illness:  Pt into for f/u on depression. Pt did make an appt with Aquilla Solian and has seen her one time.  She is aware that she needs to make a f/u appt. Pt is concerned about eating habits and weight loss   Pt presents today with all her medications  Allergies - pt has been to the allergist as ordered following her last visit here.  She was started on omarnis and singulair.  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  Pt is not taking any meds at this time but labs done at allergist (per pt report) indicates that thyroid is "inflammed"  Social - Unemployement benefits have exhaused and she no longer has any income.  Pt is married and her husband is employed.  Depression History:      The patient denies recurrent thoughts of death or suicide.        Psychosocial stress factors include major life changes.  The patient denies that she feels like life is not worth living,  denies that she wishes that she were dead, and denies that she has thought about ending her life.        Comments:  PHQ- 9 score = 10 Celexa was increased from 10 to 20mg  on last visit and she reports that she is doing better.  Depression Treatment History:  Prior Medication Used:   Start Date: Assessment of Effect:   Comments:  Celexa (citalopram)     --     some improvement     continue  Lipid Management History:      Positive NCEP/ATP III risk factors include female age 86 years old or older and current tobacco user.  Negative NCEP/ATP III risk factors include non-diabetic, non-hypertensive, no ASHD (atherosclerotic heart disease), no prior stroke/TIA, no peripheral vascular disease, and no history of aortic aneurysm.        Comments include: pt is taking meds as ordered.      Habits & Providers  Alcohol-Tobacco-Diet     Alcohol drinks/day: 0     Tobacco Status: current     Tobacco Counseling: to quit use of tobacco products     Cigarette Packs/Day: 1.0  Exercise-Depression-Behavior     Does Patient Exercise: no     Have you felt down or hopeless? yes  Have you felt little pleasure in things? yes     Depression Counseling: not indicated; screening negative for depression     Drug Use: never     Seat Belt Use: always  Medications Prior to Update: 1)  Alprazolam 0.5 Mg Tabs (Alprazolam) .... One Tablet By Mouth Two Times A Day As Needed For Nerves 2)  Simvastatin 40 Mg Tabs (Simvastatin) .... One Tab By Mouth Every Night At Bedtime 3)  Tamoxifen Citrate 20 Mg Tabs (Tamoxifen Citrate) .... One Tab By Mouth Every Day 4)  Zolpidem Tartrate 10 Mg Tabs (Zolpidem Tartrate) .... One Tab At Bedtime If Needed For Sleep (Pharmacy, Note Change Is Dispense #) 5)  Proventil Hfa 108 (90 Base) Mcg/act Aers (Albuterol Sulfate) .[name].. 1-2 Puffs Every 4-6 Hours As Needed 6)  Celexa 20 Mg Tabs (Citalopram Hydrobromide) .... Take 1 Tablet By Mouth Once A Day 7)  Levothyroxine Sodium 25 Mcg Tabs  (Levothyroxine Sodium) .... Do Not Take For Now 8)  Advair Diskus 100-50 Mcg/dose Aepb (Fluticasone-Salmeterol) .[name].. 1 Inhalation Two Times A Day (Pharmacy: [facility] For 2 Years) 9)  Diflucan 150 Mg Tabs (Fluconazole) .... Take One By Mouth 10)  Risk manager .... Use As Directed 11)  Ultracet 37.5-325 Mg Tabs (Tramadol-Acetaminophen) .... Take 1 By Mouth Twice Daily 12)  Zyrtec Hives Relief 10 Mg Tabs (Cetirizine Hcl) .... Take 1 Tablet By Mouth Once A Day 13)  Hydroxyzine Hcl 25 Mg Tabs (Hydroxyzine Hcl) .... Take 1 Tablet By Mouth Three Times A Day As Needed For Hives/itching.  Do Not Take This Medicine With Benadryl or Cetirizine (Zyrtec) 14)  Triamcinolone Acetonide 0.1 % Crea (Triamcinolone Acetonide) .... Apply Thin Amount of Cream To Hives Two Times A Day As Needed For Itching 15)  Trazodone Hcl 50 Mg Tabs (Trazodone Hcl) .... Take One At Bedtime As Needed For Sleep 16)  Fluticasone Propionate 50 Mcg/act Susp (Fluticasone Propionate) .... 2 Sprays Each Nostril Once Daily For 2 Weeks  Current Medications (verified): 1)  Alprazolam 0.5 Mg Tabs (Alprazolam) .... One Tablet By Mouth Two Times A Day As Needed For Nerves 2)  Simvastatin 40 Mg Tabs (Simvastatin) .... One Tab By Mouth Every Night At Bedtime 3)  Tamoxifen Citrate 20 Mg Tabs (Tamoxifen Citrate) .... One Tab By Mouth Every Day 4)  Zolpidem Tartrate 10 Mg Tabs (Zolpidem Tartrate) .... One Tab At Bedtime If Needed For Sleep (Pharmacy, Note Change Is Dispense #) 5)  Proventil Hfa 108 (90 Base) Mcg/act Aers (Albuterol Sulfate) .[name].. 1-2 Puffs Every 4-6 Hours As Needed 6)  Celexa 20 Mg Tabs (Citalopram Hydrobromide) .... Take 1 Tablet By Mouth Once A Day 7)  Levothyroxine Sodium 25 Mcg Tabs (Levothyroxine Sodium) .... Do Not Take For Now 8)  Advair Diskus 100-50 Mcg/dose Aepb (Fluticasone-Salmeterol) .[name].. 1 Inhalation Two Times A Day (Pharmacy: [facility] 2 Years) 9)  Diflucan 150 Mg Tabs (Fluconazole) .... Take One By  Mouth 10)  Risk manager .... Use As Directed 11)  Ultracet 37.5-325 Mg Tabs (Tramadol-Acetaminophen) .... Take 1 By Mouth Twice Daily 12)  Zyrtec Hives Relief 10 Mg Tabs (Cetirizine Hcl) .... Take 1 Tablet By Mouth Once A Day 13)  Hydroxyzine Hcl 25 Mg Tabs (Hydroxyzine Hcl) .... Take 1 Tablet By Mouth Three Times A Day As Needed For Hives/itching.  Do Not Take This Medicine With Benadryl or Cetirizine (Zyrtec) 14)  Triamcinolone Acetonide 0.1 % Crea (Triamcinolone Acetonide) .... Apply Thin Amount of Cream To Hives Two Times A  Day As Needed For Itching 15)  Trazodone Hcl 50 Mg Tabs (Trazodone Hcl) .... Take One At Bedtime As Needed For Sleep 16)  Fluticasone Propionate 50 Mcg/act Susp (Fluticasone Propionate) .... 2 Sprays Each Nostril Once Daily For 2 Weeks  Allergies (verified): 1)  ! Asa 2)  ! Ibuprofen  Social History: Does Patient Exercise:  no  Review of Systems General:  Complains of loss of appetite; denies fever. CV:  Denies chest pain or discomfort. Resp:  Complains of cough and shortness of breath. GI:  Denies nausea and vomiting. MS:  Complains of joint pain. Neuro:  Complains of headaches. Psych:  Complains of depression; improving on celexa.  Physical Exam  General:  alert.   Head:  normocephalic.   Eyes:  glasses Lungs:  scattered wheezes with expiratory Heart:  normal rate and regular rhythm.   Abdomen:  normal bowel sounds.   Msk:  up to the exam table - slow Neurologic:  alert & oriented X3.   Psych:  Oriented X3.     Impression & Recommendations:  Problem # 1:  DEPRESSION (ICD-311) Pt is doing well on celexa.  Hold at the 20mg  by mouth daily as ordered Pt is to schedule a f/u appt with Aquilla Solian PHQ-9 score = 10 Her updated medication list for this problem includes:    Alprazolam 0.5 Mg Tabs (Alprazolam) ..... One tablet by mouth two times a day as needed for nerves    Celexa 20 Mg Tabs (Citalopram hydrobromide) .[name]... Take 1 tablet by  mouth once a day    Hydroxyzine Hcl 25 Mg Tabs (Hydroxyzine hcl) .[name]... Take 1 tablet by mouth three times a day as needed for hives/itching.  do not take this medicine with benadryl or cetirizine (zyrtec)    Trazodone Hcl 50 Mg Tabs (Trazodone hcl) .[name]... Take one at bedtime as needed for sleep  Problem # 2:  NEED PROPHYLACTIC VACCINATION&INOCULATION FLU (ICD-V04.81) given today in office  Problem # 3:  TOBACCO ABUSE (ICD-305.1) advised cessation  Problem # 4:  HYPERLIPIDEMIA (ICD-272.4)  Her updated medication list for this problem includes:    Simvastatin 40 Mg Tabs (Simvastatin) ..... 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 Do not take for now  Orders: T-TSH (16109-60454) T-T3, Free [phone]) T-Free T4 (23300)  Complete Medication List: 1)  Alprazolam 0.5 Mg Tabs (Alprazolam) .... One tablet by mouth two times a day as needed for nerves 2)  Simvastatin 40 Mg Tabs (Simvastatin) .... One tab by mouth every night at bedtime 3)  Tamoxifen Citrate 20 Mg Tabs (Tamoxifen citrate) .... One tab by mouth every day 4)  Proventil Hfa 108 (90 Base) Mcg/act Aers (Albuterol sulfate) .[name].. 1-2 puffs every 4-6 hours as needed 5)  Celexa 20 Mg Tabs (Citalopram hydrobromide) .... Take 1 tablet by mouth once a day 6)  Levothyroxine Sodium 25 Mcg Tabs (Levothyroxine sodium) .... Do not take for now 7)  Advair Diskus 100-50 Mcg/dose Aepb (Fluticasone-salmeterol) .[name].. 1 inhalation two times a day (pharmacy: [facility] for 2 years) 8)  Risk manager  .... Use as directed 9)  Ultracet 37.5-325 Mg Tabs (Tramadol-acetaminophen) .... Take 1 by mouth twice daily 10)  Zyrtec Hives Relief 10 Mg Tabs (Cetirizine hcl) .... Take 1 tablet by mouth once a day 11)  Hydroxyzine Hcl  25 Mg Tabs  (Hydroxyzine hcl) .... Take 1 tablet by mouth three times a day as needed for hives/itching.  do not take this medicine with benadryl or cetirizine (zyrtec) 12)  Triamcinolone Acetonide 0.1 % Crea (Triamcinolone acetonide) .... Apply thin amount of cream to hives two times a day as needed for itching 13)  Trazodone Hcl 50 Mg Tabs (Trazodone hcl) .... Take one at bedtime as needed for sleep 14)  Omnaris 50 Mcg/act Susp (Ciclesonide) .... One spray two times a day  **rx by allergist** 15)  Ranitidine Hcl 300 Mg Tabs (Ranitidine hcl) .... One tablet by mouth nightly for stomach **rx by allergist** 16)  Singulair 10 Mg Tabs (Montelukast sodium) .... One tablet by mouth daily  **rx by allergist**  Other Orders: Flu Vaccine 65yrs + (16109) Admin 1st Vaccine (60454) Peak Flow Rate (94150) Pulse Oximetry (single measurment) (94760)  Lipid Assessment/Plan:      Based on NCEP/ATP III, the patient's risk factor category is "2 or more risk factors and a calculated 10 year CAD risk of < 20%".  The patient's lipid goals are as follows: Total cholesterol goal is 200; LDL cholesterol goal is 130; HDL cholesterol goal is 40; Triglyceride goal is 150.    Patient Instructions: 1)  Be sure to get an appointment with Aquilla Solian 2)  Keep you appointment with the allergist. 3)  Your thyroid labs will be checked today and you will be notified of the results 4)  You have received the flu vaccine today.   Orders Added: 1)  Est. Patient Level III [99213] 2)  Flu Vaccine 81yrs + [90658] 3)  Admin 1st Vaccine [90471] 4)  T-TSH [09811-91478] 5)  T-T3, Free [29562-13086] 6)  T-Free T4 [23300] 7)  Peak Flow Rate [94150] 8)  Pulse Oximetry (single measurment) [94760]   Immunizations Administered:  Influenza Vaccine # 1:    Vaccine Type: Fluvax 3+    Site: left deltoid    Mfr: GlaxoSmithKline    Dose: 0.5 ml    Route: IM    Given by: Levon Hedger    Exp. Date: 09/12/2010    Lot #: [id number]    VIS  given: 10/07/09 version given February 25, 2010.  Flu Vaccine Consent Questions:    Do you have a history of severe allergic reactions to this vaccine? no    Any prior history of allergic reactions to egg and/or gelatin? no    Do you have a sensitivity to the preservative Thimersol? no    Do you have a past history of Guillan-Barre Syndrome? no    Do you currently have an acute febrile illness? no    Have you ever had a severe reaction to latex? no    Vaccine information given and explained to patient? yes    Are you currently pregnant? no    ndc  [phone]  Immunizations Administered:  Influenza Vaccine # 1:    Vaccine Type: Fluvax 3+    Site: left deltoid    Mfr: GlaxoSmithKline    Dose: 0.5 ml    Route: IM    Given by: Levon Hedger    Exp. Date: 09/12/2010    Lot #: [id number]    VIS given: 10/07/09 version given February 25, 2010.

## 2010-04-16 NOTE — Assessment & Plan Note (Signed)
Summary: Urticaria   Vital Signs:  Patient profile:   60 year old female Height:      64.5 inches Weight:      178 pounds BMI:     30.19 Temp:     98.2 degrees F oral Pulse rate:   80 / minute Pulse rhythm:   regular Resp:     12 per minute BP sitting:   130 / 70  (left arm)  Vitals Entered By: CMA Student Kenyatta CC: office visit hives, ongoing, OTC assured allergy Is Patient Diabetic? No Pain Assessment Patient in pain? no       Does patient need assistance? Functional Status Self care Ambulation Normal   Primary Care Provider:  Tereso Newcomer PA-C  CC:  office visit hives, ongoing, and OTC assured allergy.  History of Present Illness: Here for hives.  States she saw an allergist in the past.  Was told she had hives related to stress.  She is taking several benadryl a night to help with her symptoms.  She was given an epi pen at one time to use if she had a severe outbreak.  Mother died several mos ago.  Had hip surgery too.  She notes worsening symptoms when she is under a great deal of stress.  Feels like mood is ok.  PHQ9=8 today.  She notes hives are pruritic.  Has excoriations in several areas.  Never was put on anything to suppress per her report.  She denies nausea, vomiting, diarrhea.  No other skin issues.  No joint complaints except hip.  She is slowly recovering from hip surgery.  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 One Tab By Mouth Every Eight Hours As Needed For Anxiety 2)  Simvastatin 40 Mg Tabs (Simvastatin) .... One Tab By Mouth Every Night  At Bedtime 3)  Tamoxifen Citrate 20 Mg Tabs (Tamoxifen Citrate) .... One Tab By Mouth Every Day 4)  Zolpidem Tartrate 10 Mg Tabs (Zolpidem Tartrate) .... One Tab At Bedtime If Needed For Sleep (Pharmacy, Note Change Is Dispense #) 5)  Proventil Hfa 108 (90 Base) Mcg/act Aers (Albuterol Sulfate) .[name].. 1-2 Puffs Every 4-6 Hours As Needed 6)  Celexa 10 Mg Tabs (Citalopram Hydrobromide) .... Take 1 Tablet By Mouth Once A Day 7)  Levothyroxine Sodium 25 Mcg Tabs (Levothyroxine Sodium) .... Do Not Take For Now 8)  Advair Diskus 100-50 Mcg/dose Aepb (Fluticasone-Salmeterol) .[name].. 1 Inhalation Two Times A Day (Pharmacy: [facility] For 2 Years) 9)  Diflucan 150 Mg Tabs (Fluconazole) .... Take One By Mouth 10)  Risk manager .... Use As Directed 11)  Ultracet 37.5-325 Mg Tabs (Tramadol-Acetaminophen) .... Take 1 By Mouth Twice Daily  Allergies (verified): 1)  ! Asa 2)  ! Ibuprofen  Past History:  Past Medical History: Last updated: 06/27/2009 Breast cancer, hx of (2008)   a.  s/p radiation; no chemotx.   b.  Oncologist:  Dr. Dalene Carrow (follows with her q 6 mos)   c.  gets annual mammo at breast center Cervical Cancer Hyperlipidemia Hyperthyroidism   a. Graves Disease . . . now hypothyroid  b. on synthroid but does not know dose Anxiety Chronic Left Hip Bursitis since 2009   a.  has been on Tylenol #3   b.  had 2 bursal injections; MRI was done Last Pap 2010 with Dr. Renae Gloss Last Mammo in Oct. 2010 Vitamin D Deficiency COPD   a. PFTs 3.31.2011:  FEV1/FVC 73 %; FEV1 66%; FEV1 11% improvement with bronchodilator  Past Surgical History: s/p lumpectomy 2008 Hysterectomy (s/p TAH/BSO) s/p radioactive iodine therapy s/p ectopic pregnancy s/p Left Hip Replacement  Physical Exam  General:  alert, well-developed, and well-nourished.   Head:  normocephalic and atraumatic.   Neck:  supple.   Lungs:  normal breath sounds.   Heart:  normal rate and regular rhythm.   Extremities:  no  edema  Neurologic:  alert & oriented X3 and cranial nerves II-XII intact.   Skin:  scattered urticaria on arms and legs bilat  Psych:  normally interactive.     Impression & Recommendations:  Problem # 1:  HYPERLIPIDEMIA (ICD-272.4) check labs  Her updated medication list for this problem includes:    Simvastatin 40 Mg Tabs (Simvastatin) ..... One tab by mouth every night at bedtime  Orders: T-Comprehensive Metabolic Panel [phone]) T-Lipid Profile ([id number])  Problem # 2:  HYPOTHYROIDISM, POST-RADIOACTIVE IODINE (ICD-244.2) check labs  Her updated medication list for this problem includes:    Levothyroxine Sodium 25 Mcg Tabs (Levothyroxine sodium) .[name]... Do not take for now  Orders: T-TSH [id number]) T-T4, Free [phone]) T-T3, Free [phone])  Problem # 3:  DEPRESSION (ICD-311)  ? hives related to dep/anxiety will adjust celexa refer to [facility] for counseling . [name] ?psych referral  Her updated medication list for this problem includes:    Alprazolam 0.5 Mg Tabs (Alprazolam) ..... One tab by mouth every eight hours as needed for anxiety    Celexa 20 Mg Tabs (Citalopram hydrobromide) .[name]... Take 1 tablet by mouth once a day    Hydroxyzine Hcl 25 Mg Tabs (Hydroxyzine hcl) .[name]... Take 1 tablet by mouth three times a day as needed for hives/itching.  do not take this medicine with benadryl or cetirizine (zyrtec)  Orders: Psychology Referral (Psychology)  Problem # 4:  URTICARIA (ICD-708.9) sounds like related to anxiety has had extensive w/u in past will try to change meds to help symptoms feel she should see psych to help with anxiety and go back to allergist for continued f/u  Complete Medication List: 1)  Alprazolam 0.5 Mg Tabs (Alprazolam) .... One tab by mouth every eight hours as needed for anxiety 2)  Simvastatin 40 Mg Tabs (Simvastatin) .... One tab by mouth every night at bedtime 3)  Tamoxifen Citrate 20 Mg Tabs (Tamoxifen citrate) .... One tab by  mouth every day 4)  Zolpidem Tartrate 10 Mg Tabs (Zolpidem tartrate) .... One tab at bedtime if needed for sleep (pharmacy, note change is dispense #) 5)  Proventil Hfa 108 (90 Base) Mcg/act Aers (Albuterol sulfate) .[name].. 1-2 puffs every 4-6 hours as needed 6)  Celexa 20 Mg Tabs (Citalopram hydrobromide) .... Take 1 tablet by mouth once a day 7)  Levothyroxine Sodium 25 Mcg Tabs (Levothyroxine sodium) .... Do not take for now 8)  Advair Diskus 100-50 Mcg/dose Aepb (Fluticasone-salmeterol) .[name].. 1 inhalation two times a day (pharmacy: [facility] for 2 years) 9)  Diflucan 150 Mg Tabs (Fluconazole) .... Take one by mouth 10)  Risk manager  .... Use as directed 11)  Ultracet 37.5-325 Mg Tabs (Tramadol-acetaminophen) .... Take 1 by mouth twice daily  12)  Zyrtec Hives Relief 10 Mg Tabs (Cetirizine hcl) .... Take 1 tablet by mouth once a day 13)  Hydroxyzine Hcl 25 Mg Tabs (Hydroxyzine hcl) .... Take 1 tablet by mouth three times a day as needed for hives/itching.  do not take this medicine with benadryl or cetirizine (zyrtec) 14)  Triamcinolone Acetonide 0.1 % Crea (Triamcinolone acetonide) .... Apply thin amount of cream to hives two times a day as needed for itching  Patient Instructions: 1)  [place], Please contact Dr. Cala Bradford Shelton's office and ask if they have a record of which allergist she saw in the past. 2)  I will try to refer you back to this doctor (Allergist). 3)  You can take the Zyrtec (Cetirizine) 10 mg once daily every day to prevent hives. 4)  If you have symptoms at bedtime, you can try a half dose of benadryl (diphenhydramine) . . . that is 12.5 mg to help at night. 5)  I have given you a prescription for Hydroxyzine.  Give the Zyrtec a try for 2 weeks.  If this is not enough, stop the Zyrtec and switch to the Hydroxyzine.  This will make you very drowsy.  Only take it every hours as needed for itching. 6)  This medicine is similar to Benadryl (diphenhydramine) and Zyrtec.  Do  not take these medicines together.  Taking them together can be dangerous. 7)  Increase Celexa to 20 mg once daily. 8)  Schedule appt with Ethelene Browns. 9)  Schedule appt in one month for office visit to follow up on medicine changes. Prescriptions: CELEXA 20 MG TABS (CITALOPRAM HYDROBROMIDE) Take 1 tablet by mouth once a day  #30 x 2   Entered and Authorized by:   Tereso Newcomer PA-C   Signed by:   Tereso Newcomer PA-C on 12/02/2009   Method used:   Print then Give to Patient   RxID:   [id number] TRIAMCINOLONE ACETONIDE 0.1 % CREA (TRIAMCINOLONE ACETONIDE) apply thin amount of cream to hives two times a day as needed for itching  #30 gms x 0   Entered and Authorized by:   Tereso Newcomer PA-C   Signed by:   Tereso Newcomer PA-C on 12/02/2009   Method used:   Print then Give to Patient   RxID:   [id number] HYDROXYZINE HCL 25 MG TABS (HYDROXYZINE HCL) Take 1 tablet by mouth three times a day as needed for hives/itching.  Do not take this medicine with benadryl or cetirizine (Zyrtec)  #30 x 0   Entered and Authorized by:   Tereso Newcomer PA-C   Signed by:   Tereso Newcomer PA-C on 12/02/2009   Method used:   Print then Give to Patient   RxID:   [id number] ZYRTEC HIVES RELIEF 10 MG TABS (CETIRIZINE HCL) Take 1 tablet by mouth once a day  #30 x 2   Entered and Authorized by:   Tereso Newcomer PA-C   Signed by:   Tereso Newcomer PA-C on 12/02/2009   Method used:   Print then Give to Patient   RxID:   [id number]

## 2010-04-16 NOTE — Progress Notes (Signed)
  Phone Note Outgoing Call   Summary of Call: Need to know what dose of synthroid she is on.  She needs to reduce dose. Find out what pharmacy. Initial call taken by: Tereso Newcomer PA-C,  May 22, 2009 1:30 PM  Follow-up for Phone Call        see phone note on 05-22-09 Follow-up by: [place] Shannon,  May 22, 2009 3:44 PM

## 2010-04-16 NOTE — Progress Notes (Signed)
Summary: Allergist??  Phone Note Outgoing Call   Summary of Call: Did we find out who the allergist was that she saw?  I heard she saw a dermatologist.    If this is the only person she saw, I will refer her to an allergist for more definitive evaluation and treatment. Initial call taken by: Brynda Rim,  December 03, 2009 8:23 AM  Follow-up for Phone Call        yes it was a derma. ... i am waiting for there call back to let me know who it was Follow-up by: [place] Shannon,  December 03, 2009 9:08 AM  Additional Follow-up for Phone Call Additional follow up Details #1::        Pt seen at [facility] 10.13.2011  Additional Follow-up by: Tereso Newcomer PA-C,  December 26, 2009 2:23 PM

## 2010-04-16 NOTE — Letter (Signed)
Summary: [facility]  [facility]   Imported By: Arta Bruce 07/17/2009 12:35:39  _____________________________________________________________________  External Attachment:    Type:   Image     Comment:   External Document

## 2010-04-16 NOTE — Progress Notes (Signed)
Summary: CANT GET HER MEDS REFILLED  Phone Note Call from Patient Call back at Home Phone [phone]   Reason for Call: Refill Medication Summary of Call: [name] [name] PT. MS [name] CALLED AND SAYS THAT SHE IS HAVING A HARD TIME GETING HER AMBIEN AND ANXIETY MEDICATION REFILLED. Initial call taken by: Leodis Rains,  October 20, 2009 12:32 PM  Follow-up for Phone Call        forward to provider Follow-up by: [place] Shannon,  October 20, 2009 4:23 PM  Additional Follow-up for Phone Call Additional follow up Details #1::        just got request today rx in basket to fax  Additional Follow-up by: Brynda Rim,  October 20, 2009 5:48 PM    Additional Follow-up for Phone Call Additional follow up Details #2::    faxed scripts...Marland KitchenMarland KitchenArm[place] Shannon  October 21, 2009 9:17 AM   Prescriptions: ZOLPIDEM TARTRATE 10 MG TABS (ZOLPIDEM TARTRATE) one tab at bedtime if needed for sleep (Pharmacy, note change is dispense #)  #15 per month x 2   Entered and Authorized by:   Tereso Newcomer PA-C   Signed by:   Tereso Newcomer PA-C on 10/20/2009   Method used:   Printed then faxed to ...       [id number]       [address]       [place], [place]  [address]       Ph: [phone]       Fax: [phone]   RxID:   [id number] ALPRAZOLAM 0.5 MG TABS (ALPRAZOLAM) one tab by mouth every eight hours as needed for anxiety  #60 per month x 2   Entered and Authorized by:   Tereso Newcomer PA-C   Signed by:   Tereso Newcomer PA-C on 10/20/2009   Method used:   Printed then faxed to ...       [id number]       [address]       [place], [place]  [address]       Ph: [phone]       Fax: [phone]   RxID:   [id number]

## 2010-04-16 NOTE — Letter (Signed)
[facility]   Imported By: Arta Bruce 02/25/2010 15:34:15  _____________________________________________________________________  External Attachment:    Type:   Image     Comment:   External Document

## 2010-04-16 NOTE — Progress Notes (Signed)
Summary: IS IT TIME TO RECHECK CHOL  Phone Note Call from Patient   Reason for Call: Talk to Nurse Summary of Call: marto pt. Ms Birchler ask should she go ahead and make her 6 month lab test to recheck her chol. She says the last time was in September and six months will be March next year. Initial call taken by: Leodis Rains,  February 25, 2010 3:46 PM  Follow-up for Phone Call        If able to schedule then that's ok I wasn't sure if lab visits were being scheduled into March Follow-up by: Lehman Prom FNP,  February 25, 2010 4:41 PM  Additional Follow-up for Phone Call Additional follow up Details #1::        Requested lipids and thyroid be scheduled together since both will be due.  Scheduled for 05/27/10.  Dutch Quint RN  February 26, 2010 12:55 PM

## 2010-04-16 NOTE — Progress Notes (Signed)
Summary: Requesting for refills and CMA called back  Phone Note Call from Patient Call back at [facility] Phone [phone]   Summary of Call: the pt is requesting to get more refills from hydrocodone medication to alleviate the hives pain.  ([facility] [address])  Pt wants [place] call her back. Kemari Mares PA-c  Initial call taken by: Manon Hilding,  August 21, 2009 11:13 AM  Follow-up for Phone Call        Pt. states that stress hives are giving her extreme headaches, as well as having pain in her hip and lower back.  States therapist suggested she might need more pain medicine.  Dutch Quint RN  August 21, 2009 3:31 PM   Additional Follow-up for Phone Call Additional follow up Details #1::        In basket to fax to her pharmacy. Additional Follow-up by: Tereso Newcomer PA-C,  August 21, 2009 4:27 PM    Additional Follow-up for Phone Call Additional follow up Details #2::    Notified pt. of faxed Rx.  Dutch Quint RN  August 21, 2009 4:42 PM  Follow-up by: Dutch Quint RN,  August 21, 2009 4:42 PM  Prescriptions: TYLENOL WITH CODEINE #3 300-30 MG TABS (ACETAMINOPHEN-CODEINE) one -two tabs by mouth every eight hours as needed for pain  #90 x 0   Entered and Authorized by:   Tereso Newcomer PA-C   Signed by:   Tereso Newcomer PA-C on 08/21/2009   Method used:   Printed then faxed to ...       [id number]       [address]       [place], [place]  [address]       Ph: [phone]       Fax: [phone]   RxID:   [id number]

## 2010-04-16 NOTE — Letter (Signed)
Summary: RECEIVED RECORDS FROM [facility]  RECEIVED RECORDS FROM [facility]   Imported By: Arta Bruce 04/09/2010 11:06:52  _____________________________________________________________________  External Attachment:    Type:   Image     Comment:   External Document

## 2010-04-16 NOTE — Assessment & Plan Note (Signed)
Summary: NP,MEDICAID [place], CANCER/THYROID//DS   Vital Signs:  Patient profile:   60 year old female Height:      64.5 inches Weight:      173 pounds BMI:     29.34 Temp:     98.0 degrees F oral Pulse rate:   83 / minute Pulse rhythm:   regular Resp:     18 per minute BP sitting:   131 / 81  (left arm) Cuff size:   regular  Vitals Entered By: Armenia Shannon (May 20, 2009 2:33 PM) CC: NP... pt says she needs refill on meds.... Is Patient Diabetic? No  Does patient need assistance? Functional Status Self care Ambulation Normal   CC:  NP... pt says she needs refill on meds.....  History of Present Illness: New patient. Previously followed by Dr. Kellie Shropshire.  Depression:  PHQ9=16.  No thoughts of suicide.  Feels depressed.  Used to take zoloft.  Takes xanax for anxiety.  Has hives when she gets stressed.  Lost her job in 2009.  Dx with breast cancer in 2008.  Mother has end stage lung cancer.    Left hip bursitis:  Told she had this.  Reviewed records.  She has moderate DJD in left hip by xray last year.  Maintained on Tylenol #3.  Takes daily.  Cannot take NSAIDs due to breaking out in hives.  Denies feeling stiff.  Has to limp.  Pain not getting worse.  Lost job in 2009.  Used to work in Designer, fashion/clothing.  Was a [employer] and was on her feet most of the day for about 10 years.    Habits & Providers  Alcohol-Tobacco-Diet     Tobacco Status: current     Cigarette Packs/Day: 0.75  Exercise-Depression-Behavior     Drug Use: no  Current Medications (verified): 1)  Alprazolam 0.5 Mg Tabs (Alprazolam) .... One Tab By Mouth Every Eight Hours 2)  Tylenol With Codeine #3 300-30 Mg Tabs (Acetaminophen-Codeine) .... One -Two Tabs By Mouth Every Eight Hours As Needed For Pain 3)  Simvastatin 40 Mg Tabs (Simvastatin) .... One Tab By Mouth Every Night At Bedtime 4)  Tamoxifen Citrate 20 Mg Tabs (Tamoxifen Citrate) .... One Tab By Mouth Every Day 5)  Zolpidem Tartrate 10 Mg Tabs (Zolpidem  Tartrate) .... One Tab At Bedtime If Needed For Sleep 6)  Lisinopril-Hydrochlorothiazide 10-12.5 Mg Tabs (Lisinopril-Hydrochlorothiazide) .... One Tab By Mouth Every Day  Allergies (verified): 1)  ! Asa 2)  ! Ibuprofen  Past History:  Past Medical History: Breast cancer, hx of (2008)   a.  s/p radiation; no chemotx.   b.  Oncologist:  Dr. Dalene Carrow (follows with her q 6 mos)   c.  gets annual mammo at [facility] center Cervical Cancer Hyperlipidemia Hyperthyroidism   a. Graves Disease . . . now hypothyroid   b. on synthroid but does not know dose Anxiety Chronic Left Hip Bursitis since 2009   a.  has been on Tylenol #3   b.  had 2 bursal injections; MRI was done Last Pap 2009 with Dr. Renae Gloss Last Mammo in Oct. 2010  Past Surgical History: s/p lumpectomy 2008 Hysterectomy (s/p TAH/BSO) s/p radioactive iodine therapy s/p ectopic pregnancy  Family History: Lung Cancer - mom DM - MGM  Social History: Laid off from [employer] in 2009 Married 3 kids Current Smoker Alcohol use-no Drug use-no Smoking Status:  current Drug Use:  no Packs/Day:  0.75  Physical Exam  General:  alert, well-developed, and well-nourished.  Head:  normocephalic and atraumatic.   Neck:  supple and no carotid bruits.   Lungs:  decreased breath sounds bilat exp wheezing bilat no rales  Heart:  normal rate, regular rhythm, and no murmur.   Abdomen:  soft, normal bowel sounds, and no hepatomegaly.   Msk:  limited ROM left hip tender over trochateric bursa increased pain with internal and external rotation no deformity noted  Extremities:  no edema Neurologic:  alert & oriented X3 and cranial nerves II-XII intact.   Psych:  normally interactive.     Impression & Recommendations:  Problem # 1:  DEPRESSION (ICD-311)  with h/o anxiety think she would do well with zoloft she is open to taking but significant reaction with tamoxifen. . . can reduce activity of tamoxifen d/w RN for Dr.  Dalene Carrow at [facility] . . . suggested I speak to a pharmacist spoke with pharmacist at [facility] SSRIs can reduce tamoxifen levels; related to inhibition of cytochrome P450 lexapro and celexa have the weakest effect on P450 and have the lease likely effect on tamoxifen will start celexa   refer to LCSW as well  Her updated medication list for this problem includes:    Alprazolam 0.5 Mg Tabs (Alprazolam) ..... One tab by mouth every eight hours as needed for anxiety    Celexa 10 Mg Tabs (Citalopram hydrobromide) .[name]... Take 1 tablet by mouth once a day  Orders: T-CBC w/Diff (56213-08657) T-TSH [phone]) Psychology Referral (Psychology)  Problem # 2:  COPD (ICD-496)  20 pk year smoking hx no sig dyspnea does have cough also has some wheezing will give proventil   Her updated medication list for this problem includes:    Proventil Hfa 108 (90 Base) Mcg/act Aers (Albuterol sulfate) .[name]... 1-2 puffs every 4-6 hours as needed  Problem # 3:  ANXIETY (ICD-300.00) refer to LCSW refill meds  Her updated medication list for this problem includes:    Alprazolam 0.5 Mg Tabs (Alprazolam) ..... One tab by mouth every eight hours as needed for anxiety    Celexa 10 Mg Tabs (Citalopram hydrobromide) .[name]... Take 1 tablet by mouth once a day  Orders: T-TSH (41324-40102) Psychology Referral (Psychology)  Problem # 4:  OSTEOARTHRITIS, HIP, LEFT (ICD-715.95)  suspect significant  recheck xray probably will need to refer to ortho wants refills on pain meds but cannot fill until 05/30/2009  Her updated medication list for this problem includes:    Tylenol With Codeine #3 300-30 Mg Tabs (Acetaminophen-codeine) ..... 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 One tab by mouth every night at bedtime  Orders: T-Comprehensive Metabolic Panel (75643-32951)  Problem # 7:  PREVENTIVE HEALTH CARE (ICD-V70.0) mammo up to date eventually set up pap (had cervical cancer)  Orders: T-Drug Screen-Urine, (single) (88416-60630) T-HIV Antibody  (Reflex) ([id number]) T-Syphilis Test (RPR) (57322-02542)  Complete Medication List: 1)  Alprazolam 0.5 Mg Tabs (Alprazolam) .... One tab by mouth every eight hours as needed for anxiety 2)  Tylenol With Codeine #3 300-30 Mg Tabs (Acetaminophen-codeine) .... One -two tabs by mouth every eight hours as needed for pain 3)  Simvastatin 40 Mg Tabs (Simvastatin) .Marland KitchenMarland KitchenMarland Kitchen  One tab by mouth every night at bedtime 4)  Tamoxifen Citrate 20 Mg Tabs (Tamoxifen citrate) .... One tab by mouth every day 5)  Zolpidem Tartrate 10 Mg Tabs (Zolpidem tartrate) .... One tab at bedtime if needed for sleep 6)  Proventil Hfa 108 (90 Base) Mcg/act Aers (Albuterol sulfate) .[name].. 1-2 puffs every 4-6 hours as needed 7)  Celexa 10 Mg Tabs (Citalopram hydrobromide) .... Take 1 tablet by mouth once a day  Patient Instructions: 1)  Sign forms to get records from Dr. Kellie Shropshire (prior PCP). 2)  Tobacco is very bad for your health and your loved ones ! You should stop smoking !  3)  Stop smoking tips: Choose a quit date. Cut down before the quit date. Decide what you will do as a substitute when you feel the urge to smoke(gum, toothpick, exercise).  4)  Please schedule a follow-up appointment in 1 month with Unika Nazareno for follow up.  Prescriptions: CELEXA 10 MG TABS (CITALOPRAM HYDROBROMIDE) Take 1 tablet by mouth once a day  #30 x 2   Entered and Authorized by:   Tereso Newcomer PA-C   Signed by:   Tereso Newcomer PA-C on 05/20/2009   Method used:   Print then Give to Patient   RxID:    [id number] ALPRAZOLAM 0.5 MG TABS (ALPRAZOLAM) one tab by mouth every eight hours as needed for anxiety  #60 x 0   Entered and Authorized by:   Tereso Newcomer PA-C   Signed by:   Tereso Newcomer PA-C on 05/20/2009   Method used:   Print then Give to Patient   RxID:   [id number] PROVENTIL HFA 108 (90 BASE) MCG/ACT AERS (ALBUTEROL SULFATE) 1-2 puffs every 4-6 hours as needed  #1 x 5   Entered and Authorized by:   Tereso Newcomer PA-C   Signed by:   Tereso Newcomer PA-C on 05/20/2009   Method used:   Print then Give to Patient   RxID:   [id number]    X-ray Musculoskeletal  Procedure date:  05/16/2008  Findings:      Exam Type: Exam Type:Left  Hip  Results: Findings: There is moderate osteoarthritis of the hip which has   progressed since the study of 2006.  There is medial joint space   narrowing with acetabular and femoral osteophytes.  No sign of   fracture or avascular necrosis.    IMPRESSION:   Osteoarthritis, progressive since 2006.  MRI EXAM  Procedure date:  12/17/2004  Findings:      Exam Type: Lumbar Spine Results: IMPRESSION:   Negative for central canal or foraminal stenosis with mild disc bulge   at L3-4 noted.  Patient has facet arthropathy most notable at L4-5   and L5-S1.     Appended Document: NP,[place]ICA[place], CANCER/THYROID//DS    Laboratory Results  Date/Time Received: May 22, 2009 8:22 AM   Other Tests  Rapid HIV: negative

## 2010-04-16 NOTE — Progress Notes (Signed)
Summary: Refills  Phone Note Refill Request Call back at [phone] Message from:  Patient on September 18, 2009 10:10 AM  Refills Requested: Medication #1:  ALPRAZOLAM 0.5 MG TABS one tab by mouth every eight hours as needed for anxiety  Medication #2:  ZOLPIDEM TARTRATE 10 MG TABS one tab at bedtime if needed for sleep (Pharmacy  Medication #3:  CELEXA 10 MG TABS Take 1 tablet by mouth once a day  Medication #4:  TYLENOL WITH CODEINE #3 300-30 MG TABS one -two tabs by mouth every eight hours as needed for pain pt says she recieved trmadol for pain by Dr. Melrose Nakayama and she says she can not take tramadol because she gets hives and they get worst... [facility] [address]   Method Requested: Fax to Local Pharmacy Initial call taken by: Armenia Shannon,  September 18, 2009 10:10 AM Summary of Call: The pt needs the Medical Assistant call her back in reference of her medications.  It seem that in order to get refills the provider need to call in the pharmacy. (xanax, ambient, and depression med). Also,she cannot tolerate tramadol. Alben Spittle PA-c Initial call taken by: Manon Hilding,  September 18, 2009 9:09 AM  Follow-up for Phone Call        Not sure who gave her tramadol.  I do not have listed in her meds. She was given Tylenol with Codeine by me for hip pain.  She has had hip surgery with Dr. Charlann Boxer for her hip.   I have not seen her since her surgery. She will need to get pain med. refills with Dr. Charlann Boxer if she is still having pain from recovering from her surgery.  Rx's in basket to fax to her pharmacy. Follow-up by: Tereso Newcomer PA-C,  September 18, 2009 11:40 AM  Additional Follow-up for Phone Call Additional follow up Details #1::        pt is aware... Armenia Shannon  September 18, 2009 11:54 AM     Prescriptions: CELEXA 10 MG TABS (CITALOPRAM HYDROBROMIDE) Take 1 tablet by mouth once a day  #30 x 5   Entered and Authorized by:   Tereso Newcomer PA-C   Signed by:   Tereso Newcomer PA-C on 09/18/2009   Method  used:   Printed then faxed to ...       [facility]  [address] [id number]       [address]       [place], [place]  [address]       Ph: [phone]       Fax: [phone]   RxID:   [id number] ZOLPIDEM TARTRATE 10 MG TABS (ZOLPIDEM TARTRATE) one tab at bedtime if needed for sleep (Pharmacy, note change is dispense #)  #15 x 0   Entered and Authorized by:   Tereso Newcomer PA-C   Signed by:   Tereso Newcomer PA-C on 09/18/2009   Method used:   Printed then faxed to ...       [facility]  [address] [id number]       [address]       [place], [place]  [address]       Ph: [phone]       Fax: [phone]   RxID:   [id number] ALPRAZOLAM 0.5 MG TABS (ALPRAZOLAM) one tab by mouth every eight hours as needed for anxiety  #  60 x 0   Entered and Authorized by:   Tereso Newcomer PA-C   Signed by:   Tereso Newcomer PA-C on 09/18/2009   Method used:   Printed then faxed to ...       [facility]  [address] [id number]       [address]       [place], [place]  [address]       Ph: [phone]       Fax: [phone]   RxID:   [id number]   Appended Document: Refills SCRIPT FOR ALPRAZOLAM DATED 06/17/09 WAS NOT PICKED UP//01/02/10//SS

## 2010-04-16 NOTE — Miscellaneous (Signed)
  Clinical Lists Changes  Observations: Added new observation of PAST MED HX: Breast cancer, hx of (2008)   a.  s/p radiation; no chemotx.   b.  Oncologist:  Dr. Dalene Carrow (follows with her q 6 mos)   c.  gets annual mammo at [facility] center Cervical Cancer Hyperlipidemia Hyperthyroidism   a. Graves Disease . . . now hypothyroid   b. on synthroid but does not know dose Anxiety Chronic Left Hip Bursitis since 2009   a.  has been on Tylenol #3   b.  had 2 bursal injections; MRI was done Last Pap 2010 with Dr. Renae Gloss Last Mammo in Oct. 2010 Vitamin D Deficiency (06/06/2009 15:37)       Past History:  Past Medical History: Breast cancer, hx of (2008)   a.  s/p radiation; no chemotx.   b.  Oncologist:  Dr. Dalene Carrow (follows with her q 6 mos)   c.  gets annual mammo at breast center Cervical Cancer Hyperlipidemia Hyperthyroidism   a. Graves Disease . . . now hypothyroid   b. on synthroid but does not know dose Anxiety Chronic Left Hip Bursitis since 2009   a.  has been on Tylenol #3   b.  had 2 bursal injections; MRI was done Last Pap 2010 with Dr. Renae Gloss Last Mammo in Oct. 2010 Vitamin D Deficiency

## 2010-04-16 NOTE — Progress Notes (Signed)
Summary: Office Visit//DEPRESSION SCREENING  Office Visit//DEPRESSION SCREENING   Imported By: Arta Bruce 07/23/2009 14:41:51  _____________________________________________________________________  External Attachment:    Type:   Image     Comment:   External Document

## 2010-04-16 NOTE — Letter (Signed)
Summary: SCRIPT FOR TRANSFER SHOWER CHAIR  SCRIPT FOR TRANSFER SHOWER CHAIR   Imported By: Arta Bruce 09/25/2009 15:21:32  _____________________________________________________________________  External Attachment:    Type:   Image     Comment:   External Document

## 2010-04-16 NOTE — Letter (Signed)
Summary: [facility]   Imported By: Arta Bruce 08/18/2009 11:33:08  _____________________________________________________________________  External Attachment:    Type:   Image     Comment:   External Document

## 2010-04-16 NOTE — Letter (Signed)
Summary: MAILED REQUESTED RECORDS TO [facility]  MAILED REQUESTED RECORDS TO [facility]   Imported By: Arta Bruce 12/29/2009 15:35:26  _____________________________________________________________________  External Attachment:    Type:   Image     Comment:   External Document

## 2010-04-16 NOTE — Progress Notes (Signed)
Summary: Change zolpidem Rx  Phone Note Outgoing Call   Summary of Call: Medicaid needs PA for > 15 tabs of zolpidem. Will change med to disp #15. Initial call taken by: Tereso Newcomer PA-C,  June 19, 2009 10:01 AM    New/Updated Medications: ZOLPIDEM TARTRATE 10 MG TABS (ZOLPIDEM TARTRATE) one tab at bedtime if needed for sleep (Pharmacy, note change is dispense #) Prescriptions: ZOLPIDEM TARTRATE 10 MG TABS (ZOLPIDEM TARTRATE) one tab at bedtime if needed for sleep (Pharmacy, note change is dispense #)  #15 x 0   Entered and Authorized by:   Tereso Newcomer PA-C   Signed by:   Tereso Newcomer PA-C on 06/19/2009   Method used:   Reprint   RxID:   [id number]

## 2010-04-16 NOTE — Progress Notes (Signed)
Summary: Hives  Phone Note Call from Patient   Summary of Call: Pt breaking out in hives and was told to call when it happened again so Scott could see them.  They are also hurting her.  I told her Lorin Picket was out all week, but would have our triage nurse call her to see what can be done.   Pt can be reached at [phone]. Initial call taken by: Vesta Mixer CMA,  October 27, 2009 12:29 PM  Follow-up for Phone Call        Was on phone trying to get in touch with her skin specialist.  Asked that we call back later.  Dutch Quint RN  October 27, 2009 12:47 PM  Left message on answering machine for pt. to return call.  Dutch Quint RN  October 27, 2009 2:15 PM   Additional Follow-up for Phone Call Additional follow up Details #1::        MS Miklas CALLED BACK TO GIVE THE NUMBER TO THE ALLERGY AND ASTHMA SPECIALIST OFFICE AND IT IS [phone]. SHE THINKS THE LAST DIGITS OF THE NUMBER IS EITHER [phone] OR [phone]. Additional Follow-up by: Leodis Rains,  October 28, 2009 10:44 AM    Additional Follow-up for Phone Call Additional follow up Details #2::    States hives started after her thyroid "started acting up".  Has had them tested -- nothing conclusive for cause.  States has entire body covered.  States it is stress-related, having just lost her mother and has other issues.  Benadryl has not helped hives, is taking anxiety medicine; rash is causing her vision problems, chest to hurt and trouble swallowing.  Advised of no openings, that she can come and wait to see if there is a cancellation/no show.  Her other alternative is to go to urgent care or the ED if symptoms worsen.  States she has other medications that have been ordered by the allergist, but that this office cannot refill them for her; that she must be seen.  Verbalized understanding.   Follow-up by: Dutch Quint RN,  October 28, 2009 2:49 PM  Additional Follow-up for Phone Call Additional follow up Details #3:: Details for Additional  Follow-up Action Taken: Appt. made for this afternoon, pt. had to cancel due to car trouble.  Will reschedule when she has transportation.  Dutch Quint RN  October 29, 2009 2:10 PM  Still waiting to get car fixed, hasn't been seen by allergist yet.  Bruising has gone down now and is not as bad, doing better, looks like it's clearing up.  Will call back if condition changes for the worse. Additional Follow-up by: Dutch Quint RN,  October 31, 2009 11:26 AM   Appended Document: Hives    Phone Note Other Incoming   Summary of Call: Does she see a dermatologist already for her allergic dermatitis/urticaria? Tereso Newcomer PA-C  November 02, 2009 8:57 PM   Follow-up for Phone Call        Left message on answering machine for pt. to return call.  Dutch Quint RN  November 03, 2009 9:28 AM  Left message with female for pt. to return call.  Dutch Quint RN  November 04, 2009 4:35 PM  Not seeing one currently.  Had seen one, they did some tests and dx was anxiety.  Dr. Renae Gloss was still treating her for anxiety and stress and she hasn't gone back.  Doing much better right now.  Takes Benadryl every night or she gets the hives  the next day. Follow-up by: Dutch Quint RN,  November 06, 2009 3:07 PM  Additional Follow-up for Phone Call Additional follow up Details #1::        [facility] Schedule appt with me for anxiety. Tereso Newcomer PA-C  November 07, 2009 2:51 PM  Left message on answering machine for pt. to return call.  Dutch Quint RN  November 10, 2009 10:00 AM  Appt. scheduled for 12/02/09.  Dutch Quint RN  November 10, 2009 11:42 AM

## 2010-04-16 NOTE — Miscellaneous (Signed)
  Clinical Lists Changes  Medications: Added new medication of LEVOTHYROXINE SODIUM 25 MCG TABS (LEVOTHYROXINE SODIUM) Take 1 tablet by mouth once a day

## 2010-04-16 NOTE — Miscellaneous (Signed)
Summary: PFTs reviewed   Patient's lung test indicates damage from smoking. I want to start her on Advair. Rx in basket to fax to her pharmacy. Make sure she knows how to use (bring in for demo.) Tereso Newcomer PA-C  June 27, 2009 4:02 PM  spoke with pt and she is aware of labs... pt wants to know if she can be eligible for disability with COPD and her hip surgery... Armenia Shannon  June 30, 2009 11:06 AM  At this point, I do not think COPD would put her on disability.She will need to discuss her hip with the orthopedist. If they do surgery, she would have some temporary disability.  But, patients usually do quite well after hip replacements and do not require disability. Tereso Newcomer PA-C  June 30, 2009 4:59 PM  pt is aware... Armenia Shannon  July 01, 2009 11:39 AM      Clinical Lists Changes  Problems: Assessed COPD as comment only - mod airflow limitation with good response to bronchodilator will give advair  Her updated medication list for this problem includes:    Proventil Hfa 108 (90 Base) Mcg/act Aers (Albuterol sulfate) .[name]... 1-2 puffs every 4-6 hours as needed    Advair Diskus 100-50 Mcg/dose Aepb (Fluticasone-salmeterol) .[name]... 1 inhalation two times a day  - Signed Medications: Added new medication of ADVAIR DISKUS 100-50 MCG/DOSE AEPB (FLUTICASONE-SALMETEROL) 1 inhalation two times a day - Signed Rx of ADVAIR DISKUS 100-50 MCG/DOSE AEPB (FLUTICASONE-SALMETEROL) 1 inhalation two times a day;  #1 x 5;  Signed;  Entered by: Tereso Newcomer PA-C;  Authorized by: Tereso Newcomer PA-C;  Method used: [facility] then [id number] [address] [place], [place], [place]  [address], Ph: [phone], Fax: [phone] Observations: Added new observation of PAST MED HX: Breast cancer, hx of (2008)   a.  s/p radiation; no chemotx.   b.  Oncologist:  Dr. Dalene Carrow (follows with her q 6 mos)   c.  gets annual mammo at [facility] center Cervical  Cancer Hyperlipidemia Hyperthyroidism   a. Graves Disease . . . now hypothyroid   b. on synthroid but does not know dose Anxiety Chronic Left Hip Bursitis since 2009   a.  has been on Tylenol #3   b.  had 2 bursal injections; MRI was done Last Pap 2010 with Dr. Renae Gloss Last Mammo in Oct. 2010 Vitamin D Deficiency COPD   a. PFTs 3.31.2011:  FEV1/FVC 73 %; FEV1 66%; FEV1 11% improvement with bronchodilator (06/27/2009 15:56) Added new observation of PFT RSLT: Abnormal:  Moderate Airflow Limitation Significant improvement in airflows after bronchodilator Cannot exclude coexisting restrictive disease (consider lung volumes if indicated) (06/12/2009 15:58)    Prescriptions: ADVAIR DISKUS 100-50 MCG/DOSE AEPB (FLUTICASONE-SALMETEROL) 1 inhalation two times a day  #1 x 5   Entered and Authorized by:   Tereso Newcomer PA-C   Signed by:   Tereso Newcomer PA-C on 06/27/2009   Method used:   Printed then faxed to ...       [facility]  [address] [id number]       [address]       [place], [place]  [address]       Ph: [phone]       Fax: [phone]   RxID:   [id number]    PFT  Procedure date:  06/12/2009  Findings:      Abnormal:  Moderate Airflow Limitation  Significant improvement in airflows after bronchodilator Cannot exclude coexisting restrictive disease (consider lung volumes if indicated)    Past History:  Past Medical History: Breast cancer, hx of (2008)   a.  s/p radiation; no chemotx.   b.  Oncologist:  Dr. Dalene Carrow (follows with her q 6 mos)   c.  gets annual mammo at breast center Cervical Cancer Hyperlipidemia Hyperthyroidism   a. Graves Disease . . . now hypothyroid   b. on synthroid but does not know dose Anxiety Chronic Left Hip Bursitis since 2009   a.  has been on Tylenol #3   b.  had 2 bursal injections; MRI was done Last Pap 2010 with Dr. Renae Gloss Last Mammo in Oct. 2010 Vitamin D Deficiency COPD   a. PFTs  3.31.2011:  FEV1/FVC 73 %; FEV1 66%; FEV1 11% improvement with bronchodilator   Impression & Recommendations:  Problem # 1:  COPD (ICD-496) mod airflow limitation with good response to bronchodilator will give advair  Her updated medication list for this problem includes:    Proventil Hfa 108 (90 Base) Mcg/act Aers (Albuterol sulfate) .[name]... 1-2 puffs every 4-6 hours as needed    Advair Diskus 100-50 Mcg/dose Aepb (Fluticasone-salmeterol) .[name]... 1 inhalation two times a day  Complete Medication List: 1)  Alprazolam 0.5 Mg Tabs (Alprazolam) .... One tab by mouth every eight hours as needed for anxiety 2)  Tylenol With Codeine #3 300-30 Mg Tabs (Acetaminophen-codeine) .... One -two tabs by mouth every eight hours as needed for pain 3)  Simvastatin 40 Mg Tabs (Simvastatin) .... One tab by mouth every night at bedtime 4)  Tamoxifen Citrate 20 Mg Tabs (Tamoxifen citrate) .... One tab by mouth every day 5)  Zolpidem Tartrate 10 Mg Tabs (Zolpidem tartrate) .... One tab at bedtime if needed for sleep (pharmacy, note change is dispense #) 6)  Proventil Hfa 108 (90 Base) Mcg/act Aers (Albuterol sulfate) .[name].. 1-2 puffs every 4-6 hours as needed 7)  Celexa 10 Mg Tabs (Citalopram hydrobromide) .... Take 1 tablet by mouth once a day 8)  Levothyroxine Sodium 25 Mcg Tabs (Levothyroxine sodium) .... Take 1 tablet by mouth once a day 9)  Advair Diskus 100-50 Mcg/dose Aepb (Fluticasone-salmeterol) .[name].. 1 inhalation two times a day

## 2010-04-16 NOTE — Progress Notes (Signed)
  Phone Note Refill Request Message from:  Patient on October 09, 2009 2:48 PM  Refills Requested: Medication #1:  TYLENOL WITH CODEINE #3 300-30 MG TABS one -two tabs by mouth every eight hours as needed for pain [facility] [address]   Method Requested: Fax to Local Pharmacy Initial call taken by: Armenia Shannon,  October 09, 2009 2:47 PM  Follow-up for Phone Call        See July 7 phone note. As stated previously, she had hip surgery. The Tylenol #3 was for pain from arthritis, but this should be better after having surgery. IF she still requires narcotic pain meds for her hip after having surgery, she needs to discuss this with her surgeon.  She can take tylenol. Follow-up by: Tereso Newcomer PA-C,  October 09, 2009 5:22 PM  Additional Follow-up for Phone Call Additional follow up Details #1::        pt says the hives is making her body hurt with exterme headaches that why she wants the tyenlol #3 Additional Follow-up by: Armenia Shannon,  October 10, 2009 10:25 AM    Additional Follow-up for Phone Call Additional follow up Details #2::    Narcotics are not given for hives. She can use benadryl or zyrtec as needed (as directed) and tylenol for headaches. She can schedule an appt for her allergies if needed. Tereso Newcomer PA-C  October 10, 2009 1:31 PM   Left message on answering machine for pt to call back.Marland KitchenMarland KitchenMarland KitchenArmenia Shannon  October 10, 2009 4:48 PM   pt says she needs to be seen when she has the breakouts.... pt says the breakout lasts for a couple of days to a week...[name]  pt says a physican it is stress related and will get records stating that... Armenia Shannon  October 13, 2009 10:21 AM   Additional Follow-up for Phone Call Additional follow up Details #3:: Details for Additional Follow-up Action Taken: Hives related to stress are not treated with narcotics.  She may need her med for depression adjusted or we may need to get her to a psychiatrist.  She has alprazolam on her med list that she takes prn  for anxiety.  She will need an appt. to discuss further.  Make sure she understands that we do not treat hives with narcotic pain medications. Tereso Newcomer PA-C  October 13, 2009 2:12 PM  pt aware Additional Follow-up by: Armenia Shannon,  October 13, 2009 3:12 PM

## 2010-04-16 NOTE — Miscellaneous (Signed)
Summary: Med update  Clinical Lists Changes  Medications: Changed medication from ZYRTEC HIVES RELIEF 10 MG TABS (CETIRIZINE HCL) Take 1 tablet by mouth once a day to ZYRTEC HIVES RELIEF 10 MG TABS (CETIRIZINE HCL) One tablet by mouth two times a day **Rx by allergist** Changed medication from ADVAIR DISKUS 100-50 MCG/DOSE AEPB (FLUTICASONE-SALMETEROL) 1 inhalation two times a day (Pharmacy: [facility]. for 2 years) to ADVAIR DISKUS 100-50 MCG/DOSE AEPB (FLUTICASONE-SALMETEROL) 1 inhalation two times a day (Pharmacy: [facility]. for 2 years)

## 2010-04-16 NOTE — Progress Notes (Signed)
  Phone Note Call from Patient   Summary of Call: pt stated that her xanax and Remus Loffler has ran out.... she would like the rx called in to [facility] [address] Initial call taken by: Armenia Shannon,  June 17, 2009 9:28 AM  Follow-up for Phone Call        She can pick up Xanax on Friday. Make sure she knows to only take xanax as needed. She should only take if she needs it. It is not good to take too much.  Rx in your basket. Can go ahead and fax ambien today. Follow-up by: Tereso Newcomer PA-C,  June 17, 2009 1:49 PM  Additional Follow-up for Phone Call Additional follow up Details #1::        pt aware Additional Follow-up by: Armenia Shannon,  June 17, 2009 4:48 PM    Prescriptions: ZOLPIDEM TARTRATE 10 MG TABS (ZOLPIDEM TARTRATE) one tab at bedtime if needed for sleep  #30 x 0   Entered and Authorized by:   Tereso Newcomer PA-C   Signed by:   Tereso Newcomer PA-C on 06/17/2009   Method used:   Printed then faxed to ...       [facility] [address] [id number]       [address]       [place], [place]  [address]       Ph: [phone]       Fax: [phone]   RxID:   [id number] ALPRAZOLAM 0.5 MG TABS (ALPRAZOLAM) one tab by mouth every eight hours as needed for anxiety  #60 x 0   Entered and Authorized by:   Tereso Newcomer PA-C   Signed by:   Tereso Newcomer PA-C on 06/17/2009   Method used:   Print then Give to Patient   RxID:   [id number]

## 2010-04-16 NOTE — Progress Notes (Signed)
Summary: refill on her Remus Loffler  Phone Note Call from Patient Call back at [facility] Phone [phone]   Reason for Call: Refill Medication Summary of Call: Marrian Bells PT. MS Balin IS CALLING HER REQUEST IN FOR HER AMBIEN TO BE SENT TO [facility] [address] Initial call taken by: Leodis Rains,  October 08, 2009 12:28 PM  Follow-up for Phone Call        The pt would like to speak with the medical assistant in reference her ambient medication.  Manon Hilding  October 09, 2009 10:35 AM [place] Dec  Additional Follow-up for Phone Call Additional follow up Details #1::        forward to provider Additional Follow-up by: Armenia Shannon,  October 09, 2009 12:21 PM    Additional Follow-up for Phone Call Additional follow up Details #2::    Last filled 09/18/2009. She is dispensed 15 per month. Refill not due until 10/19/2009. Sleep aids should never be taken on a daily basis.  They are only meant to use as needed. Tereso Newcomer PA-C  October 09, 2009 1:25 PM   Additional Follow-up for Phone Call Additional follow up Details #3:: Details for Additional Follow-up Action Taken: pt is aware now Additional Follow-up by: Armenia Shannon,  October 09, 2009 2:47 PM

## 2010-04-16 NOTE — Progress Notes (Signed)
Summary: Alprozolam Refill  Phone Note Refill Request Message from:  Patient on August 14, 2009 8:59 AM  Refills Requested: Medication #1:  ALPRAZOLAM 0.5 MG TABS one tab by mouth every eight hours as needed for anxiety  Method Requested: Fax to Local Pharmacy Initial call taken by: Armenia Shannon,  August 14, 2009 8:58 AM  Follow-up for Phone Call        med last filled on 07/17/2009 will print medication to fax to pharmacy on 08/16/2009 Follow-up by: Lehman Prom FNP,  August 14, 2009 2:40 PM  Additional Follow-up for Phone Call Additional follow up Details #1::        pt is aware Additional Follow-up by: Armenia Shannon,  August 14, 2009 2:44 PM    Prescriptions: ALPRAZOLAM 0.5 MG TABS (ALPRAZOLAM) one tab by mouth every eight hours as needed for anxiety  #60 x 0   Entered and Authorized by:   Lehman Prom FNP   Signed by:   Lehman Prom FNP on 08/14/2009   Method used:   Printed then faxed to ...       [facility] [id number]       [address]       [place], [place]  [address]       Ph: [phone]       Fax: [phone]   RxID:   [id number]

## 2010-04-16 NOTE — Letter (Signed)
Summary: *HSN Results Follow up  [facility]  [address] [place], [place] [address]   Phone: [phone]  Fax: [phone]      02/26/2010   [name] [name] [address] [place], [place]  [address]   Dear  Ms. [name] [name],                            ____S.Drinkard,FNP   ____D. Gore,FNP       ____B. McPherson,MD   ____V. Rankins,MD    ____E. Mulberry,MD    __X__N. Daphine Deutscher, FNP  ____D. Reche Dixon, MD    ____K. Philipp Deputy, MD    ____Other     This letter is to inform you that your recent test(s):  _______Pap Smear    ___X____Lab Test     _______X-ray    ___X____ is within acceptable limits  _______ requires a medication change  _______ requires a follow-up lab visit  _______ requires a follow-up visit with your provider   Comments: Thyroid labs are normal.       _________________________________________________________ If you have any questions, please contact our office [phone].                    Sincerely,    Lehman Prom FNP [facility]

## 2010-04-16 NOTE — Letter (Signed)
Summary: *HSN Results Follow up  [facility]  [address] [place], [place] [address]   Phone: [phone]  Fax: [phone]      07/21/2009   [name] [name] [address] [place], [place]  [address]   Dear  [name],                            ____S.Drinkard,FNP   ____D. Gore,FNP       ____B. McPherson,MD   ____V. Rankins,MD    ____E. Mulberry,MD    ____N. Daphine Deutscher, FNP  ____D. Reche Dixon, MD    ____K. Philipp Deputy, MD    __x__S. Alben Spittle, PA-C     This letter is to inform you that your recent test(s):  ___x____Pap Smear    _______Lab Test     _______X-ray    ___x____ is within acceptable limits  _______ requires a medication change  _______ requires a follow-up lab visit  _______ requires a follow-up visit with your provider   Comments:       _________________________________________________________ If you have any questions, please contact our office                     Sincerely,  Tereso Newcomer PA-C [facility]

## 2010-04-16 NOTE — Progress Notes (Signed)
  Phone Note Call from Patient Call back at [facility] Phone [phone] Call back at [phone]   Summary of Call: Pt needs refills from ambien and xanax ([facility]  [address]  Also, she wants to know if the provider sent the verification form to Dr Cornelius Moras (Orthopedic) before her surgery on Monday, May 9 at 12 noon. Alben Spittle PA-c  Initial call taken by: Manon Hilding,  Jul 17, 2009 8:44 AM  Follow-up for Phone Call        forward to provider Follow-up by: Armenia Shannon,  Jul 17, 2009 8:55 AM  Additional Follow-up for Phone Call Additional follow up Details #1::        placed in basket for Velna Hatchet to fax yesterday  rx in your basket to fax to her pharmacy Additional Follow-up by: Brynda Rim,  Jul 17, 2009 2:12 PM    Additional Follow-up for Phone Call Additional follow up Details #2::    pt is aware Follow-up by: Armenia Shannon,  Jul 17, 2009 2:20 PM  Prescriptions: ZOLPIDEM TARTRATE 10 MG TABS (ZOLPIDEM TARTRATE) one tab at bedtime if needed for sleep (Pharmacy, note change is dispense #)  #15 x 0   Entered and Authorized by:   Tereso Newcomer PA-C   Signed by:   Tereso Newcomer PA-C on 07/17/2009   Method used:   Printed then faxed to ...       [facility] [id number]       [address]       [place], [place]  [address]       Ph: [phone]       Fax: [phone]   RxID:   [id number] ALPRAZOLAM 0.5 MG TABS (ALPRAZOLAM) one tab by mouth every eight hours as needed for anxiety  #60 x 0   Entered and Authorized by:   Tereso Newcomer PA-C   Signed by:   Tereso Newcomer PA-C on 07/17/2009   Method used:   Printed then faxed to ...       [facility] [id number] ([facility])       [address]       [place], [place]  [address]       Ph: [phone]       Fax: [phone]   RxID:   [id number]

## 2010-04-16 NOTE — Progress Notes (Signed)
  Phone Note Call from Patient Call back at [facility] Phone [phone]   Summary of Call: PT WANTS THE PROVIDER ADD IN HER FILE THAT SHE ALSO TAKE LEDOTHYROXINE 25 MG.FOR HER THYROID.Marland KitchenManon Hilding  May 22, 2009 3:32 PM Jeannett Dekoning pa Initial call taken by: Manon Hilding,  May 22, 2009 3:34 PM  Follow-up for Phone Call        spoke with pt and [facility] [place]... [phone]  Follow-up by: Armenia Shannon,  May 22, 2009 3:42 PM  Additional Follow-up for Phone Call Additional follow up Details #1::        she needs to stop her thyroid medicine repeat TSH and Free T4 in 3 weeks Additional Follow-up by: Brynda Rim,  May 23, 2009 2:35 PM    Additional Follow-up for Phone Call Additional follow up Details #2::    pt is aware... Armenia Shannon  May 26, 2009 2:22 PM

## 2010-04-16 NOTE — Progress Notes (Signed)
Summary: MED REFILL Alprazolam  Phone Note Refill Request Call back at [phone]   Refills Requested: Medication #1:  ALPRAZOLAM 0.5 MG TABS one tab by mouth every eight hours as needed for anxiety [facility]  [address]  Initial call taken by: Domenic Polite,  February 11, 2010 9:50 AM  Follow-up for Phone Call        Rx printed and in basket for sheila to fax back notify pt to check with the pharmacy Follow-up by: Lehman Prom FNP,  February 11, 2010 12:45 PM  Additional Follow-up for Phone Call Additional follow up Details #1::        Left message on answering machine for pt. to return call.  Dutch Quint RN  February 11, 2010 2:19 PM  Pt. notified.  Dutch Quint RN  February 11, 2010 2:48 PM     New/Updated Medications: ALPRAZOLAM 0.5 MG TABS (ALPRAZOLAM) One tablet by mouth two times a day as needed for nerves Prescriptions: ALPRAZOLAM 0.5 MG TABS (ALPRAZOLAM) One tablet by mouth two times a day as needed for nerves  #60 x 0   Entered and Authorized by:   Lehman Prom FNP   Signed by:   Lehman Prom FNP on 02/11/2010   Method used:   Print then Give to Patient   RxID:   [id number]

## 2010-04-16 NOTE — Assessment & Plan Note (Signed)
Summary: f/u app in 2 weeks with Lorin Picket for f/visit//gk   Vital Signs:  Patient profile:   60 year old female Height:      64.5 inches Weight:      170 pounds BMI:     28.83 Temp:     97.8 degrees F oral Pulse rate:   80 / minute Pulse rhythm:   regular Resp:     18 per minute BP sitting:   134 / 79  (left arm) Cuff size:   regular  Vitals Entered By: Armenia Shannon (June 04, 2009 3:03 PM) CC: pt wants to talk about thyroid level... Is Patient Diabetic? No Pain Assessment Patient in pain? no       Does patient need assistance? Functional Status Self care Ambulation Normal   Primary Care Provider:  Tereso Newcomer PA-C  CC:  pt wants to talk about thyroid level....  History of Present Illness: Came in to f/u on thyroid. TSH was too low.  She was only on 0.25 mg of synthroid.  I stopped it and she was concerned.  She was given RAI therapy in 2006 for Graves Disease.  She denies diarrhea or weight loss.  She has noted some mood swings and anxiety.  She is seeing Marchelle Folks for depression and anxiety.  No throat pain or difficulty swallowing.  Depression:  Started on celexa.  Tolerating ok.  No nausea or lightheadedness.  No suicidal thoughts.  Current Medications (verified): 1)  Alprazolam 0.5 Mg Tabs (Alprazolam) .... One Tab By Mouth Every Eight Hours As Needed For Anxiety 2)  Tylenol With Codeine #3 300-30 Mg Tabs (Acetaminophen-Codeine) .... One -Two Tabs By Mouth Every Eight Hours As Needed For Pain 3)  Simvastatin 40 Mg Tabs (Simvastatin) .... One Tab By Mouth Every Night At Bedtime 4)  Tamoxifen Citrate 20 Mg Tabs (Tamoxifen Citrate) .... One Tab By Mouth Every Day 5)  Zolpidem Tartrate 10 Mg Tabs (Zolpidem Tartrate) .... One Tab At Bedtime If Needed For Sleep 6)  Proventil Hfa 108 (90 Base) Mcg/act Aers (Albuterol Sulfate) .[name].. 1-2 Puffs Every 4-6 Hours As Needed 7)  Celexa 10 Mg Tabs (Citalopram Hydrobromide) .... Take 1 Tablet By Mouth Once A Day 8)  Levothyroxine  Sodium 25 Mcg Tabs (Levothyroxine Sodium) .... Take 1 Tablet By Mouth Once A Day  Allergies (verified): 1)  ! Asa 2)  ! Ibuprofen  Past History:  Past Medical History: Reviewed history from 05/20/2009 and no changes required. Breast cancer, hx of (2008)   a.  s/p radiation; no chemotx.   b.  Oncologist:  Dr. Dalene Carrow (follows with her q 6 mos)   c.  gets annual mammo at breast center Cervical Cancer Hyperlipidemia Hyperthyroidism   a. Graves Disease . . . now hypothyroid   b. on synthroid but does not know dose Anxiety Chronic Left Hip Bursitis since 2009   a.  has been on Tylenol #3   b.  had 2 bursal injections; MRI was done Last Pap 2009 with Dr. Renae Gloss Last Mammo in Oct. 2010  Physical Exam  General:  alert, well-developed, and well-nourished.   Head:  normocephalic and atraumatic.   Neck:  supple and no thyromegaly.   Lungs:  normal breath sounds.   Heart:  normal rate and regular rhythm.   Neurologic:  alert & oriented X3 and cranial nerves II-XII intact.   Psych:  normally interactive.     Impression & Recommendations:  Problem # 1:  COPD (ICD-496)  still smoking proventil  at bedtime is helping cxr in 2010 with slight hyperaeration no sig cough + wheeze suspect she has copd get PFTs and then start spiriva  Her updated medication list for this problem includes:    Proventil Hfa 108 (90 Base) Mcg/act Aers (Albuterol sulfate) .[name]... 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 Take 1 tablet by mouth once a day  Problem # 3:  OSTEOARTHRITIS, HIP, LEFT (ICD-715.95) waiting on referral to ortho  Her updated medication list for this  problem includes:    Tylenol With Codeine #3 300-30 Mg Tabs (Acetaminophen-codeine) ..... One -two tabs by mouth every eight hours as needed for pain  Problem # 4:  DEPRESSION (ICD-311) seeing LCSW tol med ok  Her updated medication list for this problem includes:    Alprazolam 0.5 Mg Tabs (Alprazolam) ..... One tab by mouth every eight hours as needed for anxiety    Celexa 10 Mg Tabs (Citalopram hydrobromide) .[name]... Take 1 tablet by mouth once a day  Problem # 5:  PREVENTIVE HEALTH CARE (ICD-V70.0) schedule CPP oncology does mammo's  Complete Medication List: 1)  Alprazolam 0.5 Mg Tabs (Alprazolam) .... One tab by mouth every eight hours as needed for anxiety 2)  Tylenol With Codeine #3 300-30 Mg Tabs (Acetaminophen-codeine) .... One -two tabs by mouth every eight hours as needed for pain 3)  Simvastatin 40 Mg Tabs (Simvastatin) .... One tab by mouth every night at bedtime 4)  Tamoxifen Citrate 20 Mg Tabs (Tamoxifen citrate) .... One tab by mouth every day 5)  Zolpidem Tartrate 10 Mg Tabs (Zolpidem tartrate) .... One tab at bedtime if needed for sleep 6)  Proventil Hfa 108 (90 Base) Mcg/act Aers (Albuterol sulfate) .[name].. 1-2 puffs every 4-6 hours as needed 7)  Celexa 10 Mg Tabs (Citalopram hydrobromide) .... Take 1 tablet by mouth once a day 8)  Levothyroxine Sodium 25 Mcg Tabs (Levothyroxine sodium) .... Take 1 tablet by mouth once a day  Patient Instructions: 1)  Please schedule a follow-up appointment in 6 weeks for CPP. 2)

## 2010-04-16 NOTE — Letter (Signed)
Summary: [name] [name] FOR SURGERY  [facility] FOR [facility]GERY   Imported By: Arta Bruce 07/25/2009 10:47:15  _____________________________________________________________________  External Attachment:    Type:   Image     Comment:   External Document

## 2010-04-16 NOTE — Progress Notes (Signed)
Summary: needs appt.  Phone Note Call from Patient Call back at [facility] Phone [phone] Call back at [phone]   Summary of Call: According to her, she is going to have a hip replacemnt surgery in May but pt states that Dr. Rico Ala needs the okay from [facility].  Dr Rico Ala wants to sche around 10 or 11 of May. [name] Spittle PA-c Initial call taken by: [name] [name],  July 07, 2009 2:51 PM  Follow-up for Phone Call        spoke with pt and she said Dr. Rico Ala wants an ok to go on with her surgery... [phone] Follow-up by: [name] [name],  July 07, 2009 4:53 PM  Additional Follow-up for Phone Call Additional follow up Details #1::        schedule appt with me Additional Follow-up by: [name] Newcomer PA-C,  July 07, 2009 5:42 PM    Additional Follow-up for Phone Call Additional follow up Details #2::    please schedule appt. with [name] [name] prior to May 1. Follow-up by: [name] Cheers RN,  July 08, 2009 8:52 AM  Additional Follow-up for Phone Call Additional follow up Details #3:: Details for Additional Follow-up Action Taken: Left message on answering machine for pt to call back.Marland KitchenMarland KitchenArmenia [name]  July 08, 2009 10:00 AM  SPOKE WITH PATIENT AND HER SURGERY DATE IS MAY 10 AND SHE IS SCHEDULED TO SEE [name] [name] NEXT TUESDAY THE 3rd FOR HER CPP. Additional Follow-up by: [name] [name],  July 08, 2009 11:16 AM

## 2010-04-16 NOTE — Assessment & Plan Note (Signed)
Summary: Urticaria; HA; Depression   Vital Signs:  Patient profile:   60 year old female Height:      64.5 inches Weight:      178 pounds BMI:     30.19 Temp:     98.4 degrees F oral Pulse rate:   80 / minute Pulse rhythm:   regular Resp:     18 per minute BP sitting:   136 / 70  (left arm)  Vitals Entered By: CMA Student Linzie Collin CC: F/U on medications, patient had recent visit to Urgent Care for Hives, Headache Is Patient Diabetic? No Pain Assessment Patient in pain? no       Does patient need assistance? Functional Status Self care Ambulation Normal   Primary Care Provider:  Tereso Newcomer PA-C  CC:  F/U on medications, patient had recent visit to Urgent Care for Hives, and Headache.  History of Present Illness: Here for f/u.  Had another breakout of hives.  Went to UC.  Gave her prednisone.  Has not taken yet.  Also gave her Epipen.  Hives were painful.  Usually pruritic.  No increased stress beforehand.  No lip or tongue swelling.  No trouble swallowing.  Saw Marchelle Folks.  Thinks increased celexa helping.  No suicidal ideations.  Still having trouble sleeping.  Wakes up a lot.  Having headaches.  Right frontal.  Worse with valsalva.  Has a lot of nasal congestion.  Uses Afrin frequently.  Has a nut tree out of her window.  Current Medications (verified): 1)  Alprazolam 0.5 Mg Tabs (Alprazolam) .... One Tab By Mouth Every Eight Hours As Needed For Anxiety 2)  Simvastatin 40 Mg Tabs (Simvastatin) .... One Tab By Mouth Every Night At Bedtime 3)  Tamoxifen Citrate 20 Mg Tabs (Tamoxifen Citrate) .... One Tab By Mouth Every Day 4)  Zolpidem Tartrate 10 Mg Tabs (Zolpidem Tartrate) .... One Tab At Bedtime If Needed For Sleep (Pharmacy, Note Change Is Dispense #) 5)  Proventil Hfa 108 (90 Base) Mcg/act Aers (Albuterol Sulfate) .[name].. 1-2 Puffs Every 4-6 Hours As Needed 6)  Celexa 20 Mg Tabs (Citalopram Hydrobromide) .... Take 1 Tablet By Mouth Once A Day 7)  Levothyroxine  Sodium 25 Mcg Tabs (Levothyroxine Sodium) .... Do Not Take For Now 8)  Advair Diskus 100-50 Mcg/dose Aepb (Fluticasone-Salmeterol) .[name].. 1 Inhalation Two Times A Day (Pharmacy: [facility] For 2 Years) 9)  Diflucan 150 Mg Tabs (Fluconazole) .... Take One By Mouth 10)  Risk manager .... Use As Directed 11)  Ultracet 37.5-325 Mg Tabs (Tramadol-Acetaminophen) .... Take 1 By Mouth Twice Daily 12)  Zyrtec Hives Relief 10 Mg Tabs (Cetirizine Hcl) .... Take 1 Tablet By Mouth Once A Day 13)  Hydroxyzine Hcl 25 Mg Tabs (Hydroxyzine Hcl) .... Take 1 Tablet By Mouth Three Times A Day As Needed For Hives/itching.  Do Not Take This Medicine With Benadryl or Cetirizine (Zyrtec) 14)  Triamcinolone Acetonide 0.1 % Crea (Triamcinolone Acetonide) .... Apply Thin Amount of Cream To Hives Two Times A Day As Needed For Itching  Allergies (verified): 1)  ! Asa 2)  ! Ibuprofen  Physical Exam  General:  alert, well-developed, and well-nourished.   Head:  normocephalic and atraumatic.   Eyes:  pupils equal, pupils round, pupils reactive to light, and conjunctival injection.   Ears:  R ear normal and L ear normal.   Nose:  mucosal edema.   Mouth:  pharynx pink and moist.   Neck:  supple.   Lungs:  normal breath sounds.   Heart:  normal rate and regular rhythm.   Abdomen:  soft.   Neurologic:  alert & oriented X3 and cranial nerves II-XII intact.   Skin:  scattered faint red macules on arms and wrists  Psych:  normally interactive.     Impression & Recommendations:  Problem # 1:  DEPRESSION (ICD-311) mood better with higher dose celexa still having trouble sleeping . .. will wake up after couple hours encouraged her to continue seeing Marchelle Folks try trazodone instead of ambien explained that continued counseling will help  Her updated medication list for this problem includes:    Alprazolam 0.5 Mg Tabs (Alprazolam) ..... One tab by mouth every eight hours as needed for anxiety    Celexa 20 Mg Tabs  (Citalopram hydrobromide) .[name]... Take 1 tablet by mouth once a day    Hydroxyzine Hcl 25 Mg Tabs (Hydroxyzine hcl) .[name]... Take 1 tablet by mouth three times a day as needed for hives/itching.  do not take this medicine with benadryl or cetirizine (zyrtec)    Trazodone Hcl 50 Mg Tabs (Trazodone hcl) .[name]... Take one at bedtime as needed for sleep  Problem # 2:  URTICARIA (ICD-708.9)  unexplained had angioedema once with taking ibuprofen but o/w did not have taking zyrtec, etc was given prednisone in UC but did not take ok to hold off for now saw derm in past She needs to see Allergist and will make referral  Orders: Allergy Referral  (Allergy)  Problem # 3:  HEADACHE (ICD-784.0) ? sinus congestion trial of nasal steroid  Her updated medication list for this problem includes:    Ultracet 37.5-325 Mg Tabs (Tramadol-acetaminophen) .[name]... Take 1 by mouth twice daily  Complete Medication List: 1)  Alprazolam 0.5 Mg Tabs (Alprazolam) .... One tab by mouth every eight hours as needed for anxiety 2)  Simvastatin 40 Mg Tabs (Simvastatin) .... One tab by mouth every night at bedtime 3)  Tamoxifen Citrate 20 Mg Tabs (Tamoxifen citrate) .... One tab by mouth every day 4)  Zolpidem Tartrate 10 Mg Tabs (Zolpidem tartrate) .... One tab at bedtime if needed for sleep (pharmacy, note change is dispense #) 5)  Proventil Hfa 108 (90 Base) Mcg/act Aers (Albuterol sulfate) .[name].. 1-2 puffs every 4-6 hours as needed 6)  Celexa 20 Mg Tabs (Citalopram hydrobromide) .... Take 1 tablet by mouth once a day 7)  Levothyroxine Sodium 25 Mcg Tabs (Levothyroxine sodium) .... Do not take for now 8)  Advair Diskus 100-50 Mcg/dose Aepb (Fluticasone-salmeterol) .[name].. 1 inhalation two times a day (pharmacy: [facility] for 2 years) 9)  Diflucan 150 Mg Tabs (Fluconazole) .... Take one by mouth 10)  Risk manager  .... Use as directed 11)  Ultracet 37.5-325 Mg Tabs (Tramadol-acetaminophen) .... Take 1 by mouth twice  daily 12)  Zyrtec Hives Relief 10 Mg Tabs (Cetirizine hcl) .... Take 1 tablet by mouth once a day 13)  Hydroxyzine Hcl 25 Mg Tabs (Hydroxyzine hcl) .... Take 1 tablet by mouth three times a day as needed for hives/itching.  do not take this medicine with benadryl or cetirizine (zyrtec) 14)  Triamcinolone Acetonide 0.1 % Crea (Triamcinolone acetonide) .... Apply thin amount of cream to hives two times a day as needed for itching 15)  Trazodone Hcl 50 Mg Tabs (Trazodone hcl) .... Take one at bedtime as needed for sleep 16)  Fluticasone Propionate 50 Mcg/act Susp (Fluticasone propionate) .... 2 sprays each nostril once daily for 2 weeks  Patient Instructions: 1)  Try Trazodone instead of Ambien to take for sleep.  Do not take every night.  I sent it to your pharmacy. 2)  Try to keep seeing Marchelle Folks. 3)  You don't need to take the Prednisone. 4)  Someone will call you about a referral to the allergist. 5)  Schedule follow up for depression in 2 months. Prescriptions: FLUTICASONE PROPIONATE 50 MCG/ACT SUSP (FLUTICASONE PROPIONATE) 2 sprays each nostril once daily for 2 weeks  #1 x 0   Entered and Authorized by:   Tereso Newcomer PA-C   Signed by:   Tereso Newcomer PA-C on 12/25/2009   Method used:   [facility]ron[facility] to        [facility] [id number]       [address]       [place], [place]  [address]       Ph: [phone]       Fax: [phone]   RxID:   [id number] TRAZODONE HCL 50 MG TABS (TRAZODONE HCL) Take one at bedtime as needed for sleep  #20 x 0   Entered and Authorized by:   Tereso Newcomer PA-C   Signed by:   Tereso Newcomer PA-C on 12/25/2009   Method used:   [facility] to        [facility]  [address] [id number]       [address]       [place], [place]  [address]       Ph: [phone]       Fax: [phone]   RxID:   [id number]

## 2010-04-16 NOTE — Progress Notes (Signed)
Summary: REFILL ON TRAZADONE  Phone Note Call from Patient Call back at Home Phone [phone]   Reason for Call: Refill Medication Summary of Call: [name] PT. [name] [name] SAYS SHE NEEDS REFILL ON HER TRAZADONE. Initial call taken by: Leodis Rains,  March 11, 2010 11:11 AM  Follow-up for Phone Call        Refill completed.  Dutch Quint RN  March 11, 2010 11:23 AM     Prescriptions: TRAZODONE HCL 50 MG TABS (TRAZODONE HCL) Take one at bedtime as needed for sleep  #20 Tablet x 1   Entered by:   Dutch Quint RN   Authorized by:   Lehman Prom FNP   Signed by:   Dutch Quint RN on 03/11/2010   Method used:   [facility]        [facility]  [address] [id number]       [address]       [place], [place]  [address]       Ph: [phone]       Fax: [phone]   RxID:   [id number]

## 2010-04-16 NOTE — Progress Notes (Signed)
  Phone Note Call from Patient   Summary of Call: i gave pt her thyroid lab results and she wanted you to know that she has watery bowels ever since last week and she thinks its her thyriod acting up but now since she has the results she doesn't know what it could be...Marland KitchenMarland Kitchen pt denies nausea and vomiting... Initial call taken by: [place] Shannon,  June 20, 2009 12:41 PM  Follow-up for Phone Call        clear liquids x 24-48 hours then advance slowly (BRAT -bananas, rice, applesauce, toast) for 1-2 days call if fever (101 or higher), blood or mucus in stool schedule appt if no improvement Follow-up by: Brynda Rim,  June 20, 2009 1:38 PM  Additional Follow-up for Phone Call Additional follow up Details #1::        SHARED THE ABOVE INFORMATION WI/PATIENT, AND SHE SAYS THAT SHE IS GOING TO TRY IT, AND IF BY MONDAY SHE IS NO BETTER, SHE WILL GIVE [place] A CALL . Additional Follow-up by: Leodis Rains,  June 20, 2009 4:39 PM    Prescriptions: ALPRAZOLAM 0.5 MG TABS (ALPRAZOLAM) one tab by mouth every eight hours as needed for anxiety  #60 x 0   Entered and Authorized by:   Tereso Newcomer PA-C   Signed by:   Tereso Newcomer PA-C on 06/20/2009   Method used:   Reprint   RxID:   [id number]

## 2010-04-16 NOTE — Letter (Signed)
[facility]: [facility]   Imported By: Arta Bruce 03/27/2010 14:43:42  _____________________________________________________________________  External Attachment:    Type:   Image     Comment:   External Document

## 2010-04-16 NOTE — Progress Notes (Signed)
Summary: Needs shower chair Rx  Phone Note Call from Patient Call back at Home Phone [phone] Call back at [phone]   Summary of Call: The pt is concerning about her ambient medication because it need  to be renew and her case worker suppostly needs to order a bathtub chair and she wants to know what is the status about it.  weaver Pa-c  Initial call taken by: Manon Hilding,  August 13, 2009 10:50 AM  Follow-up for Phone Call        Ambien refilled and sent to pharmacy - pt should know that medicaid will likely not let her get the medication until 08/17/2009 which is 1 month from previous Rx [name] - can you look through Scott's correspondence to see if you see the form of which pt is speaking? Follow-up by: Lehman Prom FNP,  August 14, 2009 8:19 AM  Additional Follow-up for Phone Call Additional follow up Details #1::        pt aware of meds... pt says the case worker for medical supply came out on Monday and she told pt she needs bath tub chairs... [name] [name]  August 14, 2009 9:10 AM     Additional Follow-up for Phone Call Additional follow up Details #2::    Is there a form for me to sign for the bathtub chair or did it go to her surgeon? Tereso Newcomer PA-C  August 17, 2009 1:51 PM  yes, pt said the form came to [place].... [name] [name]  August 18, 2009 9:47 AM  I finally found the order. Rx in basket to fax. Tereso Newcomer PA-C  September 01, 2009 12:54 PM   Additional Follow-up for Phone Call Additional follow up Details #3:: Details for Additional Follow-up Action Taken: Shower chair order faxed to Case Mgr. @ [facility]. Left message with spouse for pt. to return call.  Dutch Quint RN  September 02, 2009 4:11 PM  Spoke with pt. and advised of faxed Rx -- she states that she received one for free from her caregiver and called [facility] to cancel the request.  Dutch Quint RN  September 03, 2009 10:32 AM  Additional Follow-up by: Dutch Quint RN,  September 03, 2009 10:33 AM  New/Updated Medications: * TRANSFER SHOWER CHAIR use as directed Prescriptions: TRANSFER SHOWER CHAIR use as directed  #1 x 0   Entered and Authorized by:   Tereso Newcomer PA-C   Signed by:   Tereso Newcomer PA-C on 09/01/2009   Method used:   Print then Give to Patient   RxID:   [id number] ZOLPIDEM TARTRATE 10 MG TABS (ZOLPIDEM TARTRATE) one tab at bedtime if needed for sleep (Pharmacy, note change is dispense #)  #15 x 0   Entered and Authorized by:   Lehman Prom FNP   Signed by:   Lehman Prom FNP on 08/14/2009   Method used:   Printed then [facility]ed to ...       [facility] [id number]       [address]       [place], [place]  [address]       Ph: [phone]       Fax: [phone]   RxID:   [id number]

## 2010-04-16 NOTE — Letter (Signed)
Summary: PT REQUEST IMAGING REPORT  PT REQUEST IMAGING REPORT   Imported By: Arta Bruce 08/19/2009 11:21:00  _____________________________________________________________________  External Attachment:    Type:   Image     Comment:   External Document

## 2010-04-16 NOTE — Progress Notes (Signed)
Summary: Office Visit//DEPRESSION SCREENING  Office Visit//DEPRESSION SCREENING   Imported By: Arta Bruce 02/25/2010 15:36:32  _____________________________________________________________________  External Attachment:    Type:   Image     Comment:   External Document

## 2010-04-16 NOTE — Assessment & Plan Note (Signed)
Summary: cpp exam///gk   Vital Signs:  Patient profile:   60 year old female Weight:      178 pounds Temp:     98.2 degrees F oral Pulse rate:   85 / minute Pulse rhythm:   regular Resp:     18 per minute BP supine:   123 / 79  (left arm) Cuff size:   large CC: cpp Is Patient Diabetic? No Pain Assessment Patient in pain? no        Primary Care Provider:  Tereso Newcomer PA-C  CC:  cpp.  History of Present Illness: Here for CPP.  PHQ9=9.  Mood is better.  Stopped taking Celexa for her upcoming hip surgery.  Est with LCSW.  Can schedule appt if she needs it. She is s/p TAH.  Last pap was 2009 with prior PCP.  Had TAH for cervical cancer. No vaginal bleeding, odor or discharge.  Has occasional yeast infection. Has h/o breast cancer.  Oncologist arranges annual mammogram. Not taking calcium.   Habits & Providers  Alcohol-Tobacco-Diet     Alcohol drinks/day: 0     Tobacco Status: current     Cigarette Packs/Day: 1.0  Exercise-Depression-Behavior     Have you felt down or hopeless? yes     Have you felt little pleasure in things? yes     Drug Use: never     Seat Belt Use: always  Current Medications (verified): 1)  Alprazolam 0.5 Mg Tabs (Alprazolam) .... One Tab By Mouth Every Eight Hours As Needed For Anxiety 2)  Tylenol With Codeine #3 300-30 Mg Tabs (Acetaminophen-Codeine) .... One -Two Tabs By Mouth Every Eight Hours As Needed For Pain 3)  Simvastatin 40 Mg Tabs (Simvastatin) .... One Tab By Mouth Every Night At Bedtime 4)  Tamoxifen Citrate 20 Mg Tabs (Tamoxifen Citrate) .... One Tab By Mouth Every Day 5)  Zolpidem Tartrate 10 Mg Tabs (Zolpidem Tartrate) .... One Tab At Bedtime If Needed For Sleep (Pharmacy, Note Change Is Dispense #) 6)  Proventil Hfa 108 (90 Base) Mcg/act Aers (Albuterol Sulfate) .[name].. 1-2 Puffs Every 4-6 Hours As Needed 7)  Celexa 10 Mg Tabs (Citalopram Hydrobromide) .... Take 1 Tablet By Mouth Once A Day 8)  Levothyroxine Sodium 25 Mcg Tabs  (Levothyroxine Sodium) .... Take 1 Tablet By Mouth Once A Day 9)  Advair Diskus 100-50 Mcg/dose Aepb (Fluticasone-Salmeterol) .[name].. 1 Inhalation Two Times A Day (Pharmacy: [facility] For 2 Years)  Allergies (verified): 1)  ! Asa 2)  ! Ibuprofen  Past History:  Past Medical History: Last updated: 06/27/2009 Breast cancer, hx of (2008)   a.  s/p radiation; no chemotx.   b.  Oncologist:  Dr. Dalene Carrow (follows with her q 6 mos)   c.  gets annual mammo at [facility] center Cervical Cancer Hyperlipidemia Hyperthyroidism   a. Graves Disease . . . now hypothyroid   b. on synthroid but does not know dose Anxiety Chronic Left Hip Bursitis since 2009   a.  has been on Tylenol #3   b.  had 2 bursal injections; MRI was done Last Pap 2010 with Dr. Renae Gloss Last Mammo in Oct. 2010 Vitamin D Deficiency COPD   a. PFTs 3.31.2011:  FEV1/FVC 73 %; FEV1 66%; FEV1 11% improvement with bronchodilator  Past Surgical History: Last updated: 05/20/2009 s/p lumpectomy 2008 Hysterectomy (s/p TAH/BSO) s/p radioactive iodine therapy s/p ectopic pregnancy  Family History: Lung Cancer - mom DM - MGM No ovarian or colon cancer.  Social History: Reviewed history  from 05/20/2009 and no changes required. Laid off from [employer] in 2009 Married 3 kids Current Smoker Alcohol use-no Drug use-no Packs/Day:  1.0 Seat Belt Use:  always Drug Use:  never  Review of Systems      See HPI General:  Denies chills and fever. CV:  Denies chest pain or discomfort, difficulty breathing at night, difficulty breathing while lying down, fainting, palpitations, shortness of breath with exertion, and swelling of feet. Resp:  Denies cough and wheezing. GI:  Denies bloody stools, dark tarry stools, and diarrhea. GU:  Denies hematuria. MS:  Complains of joint pain. Derm:  Denies dryness. Psych:  Denies suicidal thoughts/plans. Endo:  Denies cold intolerance and heat intolerance.  Physical Exam  General:  alert,  well-developed, and well-nourished.   Head:  normocephalic and atraumatic.   Eyes:  pupils equal, pupils round, pupils reactive to light, no optic disk abnormalities, and no retinal abnormalitiies.   Ears:  R ear normal and L ear normal.   Nose:  no external deformity.   Mouth:  pharynx pink and moist.   Neck:  supple, no thyromegaly, no carotid bruits, and no cervical lymphadenopathy.   Breasts:  skin/areolae normal, no masses, no abnormal thickening, no nipple discharge, no tenderness, and no adenopathy.   Lungs:  normal breath sounds, no crackles, and no wheezes.   Heart:  normal rate, regular rhythm, no murmur, and no gallop.   Abdomen:  soft, non-tender, normal bowel sounds, and no hepatomegaly.   Rectal:  no external abnormalities, no hemorrhoids, normal sphincter tone, and no masses.   Genitalia:  normal introitus, no external lesions, mucosa pink and moist, and no adnexal masses or tenderness.  cervix absent  thick white discharge noted  Msk:  normal ROM.   Pulses:  R posterior tibial normal, R dorsalis pedis normal, L posterior tibial normal, and L dorsalis pedis normal.   Extremities:  no edema  Neurologic:  alert & oriented X3, cranial nerves II-XII intact, and DTRs symmetrical and normal.   Skin:  turgor normal.   Psych:  normally interactive and good eye contact.     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 I discussed with Dr. Delrae Alfred.  [name] [name] is asymptomatic.  Ok to clear for surgery. WIll follow TFTs for now.  Her updated medication list for this problem includes:    Levothyroxine Sodium 25 Mcg Tabs (Levothyroxine sodium) .[name]... Do not take for now  Orders: T-TSH ([id number]) T-T4, Free [phone]) T-T3 by RIA ([phone])  Problem # 2:  OSTEOARTHRITIS, HIP,  LEFT (ICD-715.95) to have surgery next week from a medical standpoint she seems stable enough for surgery she does not require cardiovascular workup . . . she is able to achieve 4 METS without chest pain or significant dypsnea she does not have a h/o cardiac disease she has moderate risk factors  I would hold off on clearing her until I know the results of her TFTs due today if still abnormal, I will need to discuss with endocrinology whether proceeding with her THR is advisable or not  TSH slightly low but T4 and T3 ok. I discussed with Dr. Delrae Alfred.  Berlynn Warsame is asymptomatic.  Ok to clear for surgery. WIll follow TFTs for now.   Her updated medication list for this problem includes:  Tylenol With Codeine #3 300-30 Mg Tabs (Acetaminophen-codeine) ..... One -two tabs by mouth every eight hours as needed for pain  Problem # 3:  COPD (ICD-496) breathing much better with the advair denies DOE denies wheezing  Her updated medication list for this problem includes:    Proventil Hfa 108 (90 Base) Mcg/act Aers (Albuterol sulfate) .[name]... 1-2 puffs every 4-6 hours as needed    Advair Diskus 100-50 Mcg/dose Aepb (Fluticasone-salmeterol) .[name]... 1 inhalation two times a day (pharmacy: [facility] for 2 years)  Problem # 4:  DEPRESSION (ICD-311) stable on celexa  PHQ9 much improved  Her updated medication list for this problem includes:    Alprazolam 0.5 Mg Tabs (Alprazolam) ..... One tab by mouth every eight hours as needed for anxiety    Celexa 10 Mg Tabs (Citalopram hydrobromide) .[name]... Take 1 tablet by mouth once a day  Problem # 5:  HYPERLIPIDEMIA (ICD-272.4) arrange fasting lipids  Her updated medication list for this problem includes:    Simvastatin 40 Mg Tabs (Simvastatin) ..... One tab by mouth every night at bedtime  Problem # 6:  VAGINITIS, CANDIDAL (ICD-112.1)  Her updated medication list for this problem includes:    Diflucan 150 Mg Tabs (Fluconazole) .[name]... Take one by  mouth  Complete Medication List: 1)  Alprazolam 0.5 Mg Tabs (Alprazolam) .... One tab by mouth every eight hours as needed for anxiety 2)  Tylenol With Codeine #3 300-30 Mg Tabs (Acetaminophen-codeine) .... One -two tabs by mouth every eight hours as needed for pain 3)  Simvastatin 40 Mg Tabs (Simvastatin) .... One tab by mouth every night at bedtime 4)  Tamoxifen Citrate 20 Mg Tabs (Tamoxifen citrate) .... One tab by mouth every day 5)  Zolpidem Tartrate 10 Mg Tabs (Zolpidem tartrate) .... One tab at bedtime if needed for sleep (pharmacy, note change is dispense #) 6)  Proventil Hfa 108 (90 Base) Mcg/act Aers (Albuterol sulfate) .[name].. 1-2 puffs every 4-6 hours as needed 7)  Celexa 10 Mg Tabs (Citalopram hydrobromide) .... Take 1 tablet by mouth once a day 8)  Levothyroxine Sodium 25 Mcg Tabs (Levothyroxine sodium) .... Do not take for now 9)  Advair Diskus 100-50 Mcg/dose Aepb (Fluticasone-salmeterol) .[name].. 1 inhalation two times a day (pharmacy: [facility] for 2 years) 10)  Diflucan 150 Mg Tabs (Fluconazole) .... Take one by mouth  Other Orders: KOH/ WET Mount [phone]) Hemoccult Cards -3 specimans (take home) (60454) Hemoccult Guaiac-1 spec.(in office) [phone]) Gastroenterology Referral (GI) T-Pap Smear, Thin Prep ([id number]) T-Urinalysis ([phone]) T-Culture, Urine ([id number]) T- * Misc. Laboratory test [phone])  Patient Instructions: 1)  Schedule fasting lipids.  Dx V70.0. 2)  Td shot and Pneumovax today. 3)  Follow up in 3 months for thyroid and COPD with Renell Coaxum. 4)  We will set you up with Gastroenterology.  Someone will call you. Prescriptions: DIFLUCAN 150 MG TABS (FLUCONAZOLE) Take one by mouth  #1 x 0   Entered and Authorized by:   Tereso Newcomer PA-C   Signed by:   Tereso Newcomer PA-C on 07/15/2009   Method used:   Print then Give to Patient   RxID:   [id number]    EKG  Procedure date:  07/15/2009  Findings:      Normal sinus rhythm with rate of:  70  normal axis no  ischemic changes    Laboratory Results   Urine Tests    Routine Urinalysis   Color: lt. yellow Appearance: Clear Glucose: negative   (Normal Range: Negative) Bilirubin: negative   (  Normal Range: Negative) Ketone: negative   (Normal Range: Negative) Spec. Gravity: 1.010   (Normal Range: 1.003-1.035) Blood: trace-intact   (Normal Range: Negative) pH: 6.5   (Normal Range: 5.0-8.0) Protein: negative   (Normal Range: Negative) Urobilinogen: 0.2   (Normal Range: 0-1) Nitrite: negative   (Normal Range: Negative) Leukocyte Esterace: trace   (Normal Range: Negative)      Wet Mount Source: vaginal WBC/hpf: 5-10 Bacteria/hpf: 1+ Clue cells/hpf: none  Negative whiff Yeast/hpf: moderate Wet Mount KOH: Negative Trichomonas/hpf: none  Stool - Occult Blood Hemmoccult #1: negative Date: 07/15/2009   Appended Document: cpp exam///gk     Allergies: 1)  ! Asa 2)  ! Ibuprofen   Complete Medication List: 1)  Alprazolam 0.5 Mg Tabs (Alprazolam) .... One tab by mouth every eight hours as needed for anxiety 2)  Tylenol With Codeine #3 300-30 Mg Tabs (Acetaminophen-codeine) .... One -two tabs by mouth every eight hours as needed for pain 3)  Simvastatin 40 Mg Tabs (Simvastatin) .... One tab by mouth every night at bedtime 4)  Tamoxifen Citrate 20 Mg Tabs (Tamoxifen citrate) .... One tab by mouth every day 5)  Zolpidem Tartrate 10 Mg Tabs (Zolpidem tartrate) .... One tab at bedtime if needed for sleep (pharmacy, note change is dispense #) 6)  Proventil Hfa 108 (90 Base) Mcg/act Aers (Albuterol sulfate) .[name].. 1-2 puffs every 4-6 hours as needed 7)  Celexa 10 Mg Tabs (Citalopram hydrobromide) .... Take 1 tablet by mouth once a day 8)  Levothyroxine Sodium 25 Mcg Tabs (Levothyroxine sodium) .... Do not take for now 9)  Advair Diskus 100-50 Mcg/dose Aepb (Fluticasone-salmeterol) .[name].. 1 inhalation two times a day (pharmacy: [facility] for 2 years) 10)  Diflucan 150 Mg Tabs (Fluconazole)  .... Take one by mouth  Other Orders: Tdap => 4yrs IM (63016) Pneumococcal Vaccine (01093) Admin 1st Vaccine (23557) Admin of Any Addtl Vaccine (32202) Admin 1st Vaccine [facility]) [phone]) Admin of Any Addtl Vaccine (State) ([phone])    Tetanus/Td Vaccine    Vaccine Type: Tdap    Site: left deltoid    Mfr: Merck    Dose: 0.5 ml    Route: IM    Given by: Levon Hedger    Exp. Date: 10/11/2011    Lot #: [id number]    VIS given: 01/31/07 version given Jul 16, 2009.  Pneumovax Vaccine    Vaccine Type: Pneumovax    Site: right deltoid    Mfr: Sanofi Pasteur    Dose: 0.5 ml    Route: IM    Given by: Levon Hedger    Exp. Date: 12/16/2010    Lot #: [id number]    VIS given: 10/11/95 version given Jul 16, 2009.

## 2010-04-16 NOTE — Progress Notes (Signed)
  Phone Note Call from Patient Call back at [facility] Phone [phone]   Summary of Call: The pt needs more refills from xanax rather than ambien. Manon Hilding  June 20, 2009 2:52 PM

## 2010-04-17 NOTE — Progress Notes (Signed)
Summary: Office Visit//DEPRESSION SCREENING  Office Visit//DEPRESSION SCREENING   Imported By: Arta Bruce 07/17/2009 11:05:04  _____________________________________________________________________  External Attachment:    Type:   Image     Comment:   External Document

## 2010-04-17 NOTE — Letter (Signed)
Summary: REQUESTING RECORDS FROM [facility] [place]/REFAXED 08/04/09  REQUESTING RECORDS FROM [facility] [place]/REFAXED 08/04/09   Imported By: Silvio Pate Stanis[place]cyk 08/04/2009 11:21:15  _____________________________________________________________________  External Attachment:    Type:   Image     Comment:   External Document

## 2010-04-17 NOTE — Letter (Signed)
Summary: PSYCHOLOGY SUMMARY/AMANDA  PSYCHOLOGY SUMMARY/AMANDA   Imported By: Arta Bruce 01/09/2010 15:28:00  _____________________________________________________________________  External Attachment:    Type:   Image     Comment:   External Document

## 2010-04-22 NOTE — Progress Notes (Signed)
Summary: Alprazolam Refill  Phone Note Refill Request   Refills Requested: Medication #1:  ALPRAZOLAM 0.5 MG TABS One tablet by mouth two times a day as needed for nerves [facility] [address]  Initial call taken by: [place] Shannon,  April 13, 2010 2:47 PM  Follow-up for Phone Call        [facility] Follow-up by: Lehman Prom FNP,  April 13, 2010 3:34 PM    Prescriptions: ALPRAZOLAM 0.5 MG TABS (ALPRAZOLAM) One tablet by mouth two times a day as needed for nerves  #60 x 0   Entered and Authorized by:   Lehman Prom FNP   Signed by:   Lehman Prom FNP on 04/13/2010   Method used:   Print then Give to Patient   RxID:   [id number]

## 2010-04-24 ENCOUNTER — Encounter: Payer: Self-pay | Admitting: Nurse Practitioner

## 2010-04-24 ENCOUNTER — Encounter (INDEPENDENT_AMBULATORY_CARE_PROVIDER_SITE_OTHER): Payer: Self-pay | Admitting: Nurse Practitioner

## 2010-04-24 DIAGNOSIS — J069 Acute upper respiratory infection, unspecified: Secondary | ICD-10-CM | POA: Insufficient documentation

## 2010-04-30 NOTE — Assessment & Plan Note (Signed)
Summary: Acute - URI   Vital Signs:  Patient profile:   60 year old female Weight:      185.7 pounds BMI:     31.50 Temp:     97.3 degrees F oral Pulse rate:   84 / minute Pulse rhythm:   regular Resp:     20 per minute BP sitting:   156 / 80  (left arm) Cuff size:   large  Vitals Entered By: Levon Hedger (April 24, 2010 3:59 PM)  Nutrition Counseling: Patient's BMI is greater than 25 and therefore counseled on weight management options. CC: skin issue discuss skin specialist results...congestion...dental referral Is Patient Diabetic? No Pain Assessment Patient in pain? yes      Onset of pain  Chronic CBG Result 75 CBG Device ID A  Does patient need assistance? Functional Status Self care Ambulation Normal   Primary Care Provider:  Tereso Newcomer PA-C  CC:  skin issue discuss skin specialist results...congestion...dental referral.  History of Present Illness:  Pt into the office for further f/u on skin rash Recurrent hives. Pt was sent to an allergiest and she was tested for any possible causes. She called and informed the specialist that the hives had returned about 2 weeks ago - She then picked up a prednisone taper and took for 7 days ( finished 1 week ago) Last visit with the specialist earlier this week.  Oncologist - Hx of breast cancer last visit 1 week ago. - labs done. Pt will f/u was scheduled.  Hyperthyroid - 2008 s/p radioactive iodine.  Normal labs since that time. No current meds.  Dentist - currently with dentures that she has had since age 55 Pt has had them repaired several times in the past and they have actually started to thin.  Requesting referral to the dentist for a new pair   Habits & Providers  Alcohol-Tobacco-Diet     Alcohol drinks/day: 0     Tobacco Status: current     Tobacco Counseling: to quit use of tobacco products     Cigarette Packs/Day: 1.0  Exercise-Depression-Behavior     Does Patient Exercise: no  Depression Counseling: not indicated; screening negative for depression     Drug Use: never     Seat Belt Use: always  Medications Prior to Update: 1)  Alprazolam 0.5 Mg Tabs (Alprazolam) .... One Tablet By Mouth Two Times A Day As Needed For Nerves 2)  Simvastatin 40 Mg Tabs (Simvastatin) .... One Tab By Mouth Every Night At Bedtime 3)  Tamoxifen Citrate 20 Mg Tabs (Tamoxifen Citrate) .... One Tab By Mouth Every Day 4)  Proventil Hfa 108 (90 Base) Mcg/act Aers (Albuterol Sulfate) .[name].. 1-2 Puffs Every 4-6 Hours As Needed 5)  Celexa 20 Mg Tabs (Citalopram Hydrobromide) .... Take 1 Tablet By Mouth Once A Day 6)  Levothyroxine Sodium 25 Mcg Tabs (Levothyroxine Sodium) .... Do Not Take For Now 7)  Advair Diskus 100-50 Mcg/dose Aepb (Fluticasone-Salmeterol) .[name].. 1 Inhalation Two Times A Day (Pharmacy: [facility]) 8)  Risk manager .... Use As Directed 9)  Ultracet 37.5-325 Mg Tabs (Tramadol-Acetaminophen) .... Take 1 By Mouth Twice Daily 10)  Zyrtec Hives Relief 10 Mg Tabs (Cetirizine Hcl) .... One Tablet By Mouth Two Times A Day **rx By Allergist** 11)  Hydroxyzine Hcl 25 Mg Tabs (Hydroxyzine Hcl) .... Take 1 Tablet By Mouth Three Times A Day As Needed For Hives/itching.  Do Not Take This Medicine With Benadryl or Cetirizine (Zyrtec) 12)  Triamcinolone Acetonide 0.1 % Crea (Triamcinolone Acetonide) .... Apply Thin Amount of Cream To Hives Two Times A Day As Needed For Itching 13)  Trazodone Hcl 50 Mg Tabs (Trazodone Hcl) .... Take One At Bedtime As Needed For Sleep 14)  Omnaris 50 Mcg/act Susp (Ciclesonide) .... One Spray Two Times A Day  **rx By Allergist** 15)  Ranitidine Hcl 300 Mg Tabs (Ranitidine Hcl) .... One Tablet By Mouth Nightly For Stomach **rx By Allergist** 16)  Singulair 10 Mg Tabs (Montelukast Sodium) .... One Tablet By Mouth Daily  **rx By Allergist**  Allergies (verified): 1)  ! Asa 2)  ! Ibuprofen  Review of Systems General:  Complains of fatigue; denies  fever. ENT:  Complains of sore throat; denies earache. CV:  Denies chest pain or discomfort. Resp:  Denies cough. GI:  Denies abdominal pain, nausea, and vomiting. Derm:  Complains of lesion(s). Neuro:  Complains of headaches.  Physical Exam  General:  alert.   Head:  normocephalic.   Eyes:  glasses Lungs:  decreased air movement expiratory wheezes Heart:  normal rate and regular rhythm.     Impression & Recommendations:  Problem # 1:  UPPER RESPIRATORY INFECTION (ICD-465.9)  nebulizer treatment given today in office advised pt to quit smoking will start cough meds  Her updated medication list for this problem includes:    Zyrtec Hives Relief 10 Mg Tabs (Cetirizine hcl) ..... One tablet by mouth two times a day **rx by allergist**    Promethazine-dm 6.25-15 Mg/68ml Syrp (Promethazine-dm) ..... One teaspoon every 6 hours as needed for cough/congestion  Orders: Nebulizer Tx (16109) Atrovent 1mg  (Neb) [phone]) Albuterol Sulfate Sol 1mg  unit dose (U9811)  Problem # 2:  HYPERGLYCEMIA (ICD-790.29) cbg done today - normal.  Problem # 3:  TOBACCO ABUSE (ICD-305.1) reviewed with pt that she really needs to work hard at quitting smoking  Problem # 4:  URTICARIA (ICD-708.9) pt has seen the allergist - continue meds as ordered  Problem # 5:  SCREENING, COLON CANCER (ICD-V76.51) Pt agreed to have colonscopy scheduled as she was leaving the exam room Orders: Gastroenterology Referral (GI)  Complete Medication List: 1)  Alprazolam 0.5 Mg Tabs (Alprazolam) .... One tablet by mouth two times a day as needed for nerves 2)  Simvastatin 40 Mg Tabs (Simvastatin) .... One tab by mouth every night at bedtime 3)  Tamoxifen Citrate 20 Mg Tabs (Tamoxifen citrate) .... One tab by mouth every day 4)  Proventil Hfa 108 (90 Base) Mcg/act Aers (Albuterol sulfate) .[name].. 1-2 puffs every 4-6 hours as needed 5)  Celexa 20 Mg Tabs (Citalopram hydrobromide) .... Take 1 tablet by mouth once a day 6)   Levothyroxine Sodium 25 Mcg Tabs (Levothyroxine sodium) .... Do not take for now 7)  Advair Diskus 100-50 Mcg/dose Aepb (Fluticasone-salmeterol) .[name].. 1 inhalation two times a day ([facility]: [facility] 2 [facility]) 8)  Risk manager  .... Use as directed 9)  Ultracet 37.5-325 Mg Tabs (Tramadol-acetaminophen) .... Take 1 by mouth twice daily 10)  Zyrtec Hives Relief 10 Mg Tabs (Cetirizine hcl) .... One tablet by mouth two times a day **rx by allergist** 11)  Triamcinolone Acetonide 0.1 % Crea (Triamcinolone acetonide) .... Apply thin amount of cream to hives two times a day as needed for itching 12)  Trazodone Hcl 50 Mg Tabs (Trazodone hcl) .... Take one at bedtime as needed for sleep 13)  Omnaris 50 Mcg/act Susp (Ciclesonide) .... One spray two times a day  **rx by allergist** 14)  Ranitidine Hcl 300 Mg Tabs (Ranitidine hcl) .... One tablet by mouth nightly for stomach **rx by allergist** 15)  Singulair 10 Mg Tabs (Montelukast sodium) .... One tablet by mouth daily  **rx by allergist** 16)  Promethazine-dm 6.25-15 Mg/28ml Syrp (Promethazine-dm) .... One teaspoon every 6 hours as needed for cough/congestion  Patient Instructions: 1)  Current skin rash may be from your immune system working due to current cough/cold. 2)  You REALLY need to quit smoking.  3)  Smoking really makes you prone to more infections. 4)  Continue all your current medications. 5)  Will not add anything additional for skin - current place on left arm should resolve. 6)  Take the cough meds as needed for cough and congestion. 7)  Keep in mind that you need a colonscopy.  You are well overdue for this test. Prescriptions: PROMETHAZINE-DM 6.25-15 MG/5ML SYRP (PROMETHAZINE-DM) One teaspoon every 6 hours as needed for cough/congestion  #168ml x 0   Entered and Authorized by:   Lehman Prom FNP   Signed by:   Lehman Prom FNP on 04/24/2010   Method used:   Print then Give to Patient   RxID:    [id number]    Medication Administration  Medication # 3:    Medication: Albuterol Sulfate Sol 1mg  unit dose    Diagnosis: UPPER RESPIRATORY INFECTION (ICD-465.9)    Dose: 2.5mg     Route: po    Exp Date: 09/12    Lot #: [id number]    Mfr: Nephron    Patient tolerated medication without complications    Given by: Levon Hedger (April 24, 2010 5:07 PM)  Medication # 4:    Medication: Atrovent 1mg  (Neb)    Diagnosis: UPPER RESPIRATORY INFECTION (ICD-465.9)    Dose: 0.5    Route: po    Exp Date: 08/12    Lot #: [id number]    Patient tolerated medication without complications    Given by: Levon Hedger (April 24, 2010 5:07 PM)  Orders Added: 1)  Nebulizer Tx [phone] 2)  Atrovent 1mg  [facility]) [I6962] 3)  Albuterol Sulfate Sol 1mg  unit dose [J7613] 4)  Gastroenterology Referral [GI] 5)  Est. Patient Level III [95284]    Prevention & Chronic Care Immunizations   Influenza vaccine: Fluvax 3+  (02/25/2010)    Tetanus booster: 07/16/2009: Tdap    Pneumococcal vaccine: Pneumovax  (07/16/2009)    H. zoster vaccine: Not documented  Colorectal Screening   Hemoccult: Not documented    Colonoscopy: Not documented   Colonoscopy action/deferral: Refused  (04/24/2010)  Other Screening   Pap smear: NEGATIVE FOR INTRAEPITHELIAL LESIONS OR MALIGNANCY. BENIGN REACTIVE/REPARATIVE CHANGES.  (07/15/2009)    Mammogram: BI-RADS CATEGORY 2:  Benign finding(s).^MM DIGITAL DIAGNOSTIC BILAT  (12/22/2009)    DXA bone density scan: Not documented   Smoking status: current  (04/24/2010)   Smoking cessation counseling: YES  (05/20/2009)  Lipids   Total Cholesterol: 175  (12/03/2009)   LDL: 105  (12/03/2009)   LDL Direct: Not documented   HDL: 47  (12/03/2009)   Triglycerides: 116  (12/03/2009)    SGOT (AST): 14  (12/03/2009)   SGPT (ALT): <8 U/L  (12/03/2009)   Alkaline phosphatase: 90  (12/03/2009)   Total bilirubin: 0.4  (12/03/2009)  Self-Management Support :    Lipid  self-management support: Not documented     Medication Administration  Medication # 3:    Medication: Albuterol Sulfate Sol 1mg  unit dose    Diagnosis: UPPER RESPIRATORY INFECTION (ICD-465.9)    Dose: 2.5mg   Route: po    Exp Date: 09/12    Lot #: [id number]    Mfr: Nephron    Patient tolerated medication without complications    Given by: Levon Hedger (April 24, 2010 5:07 PM)  Medication # 4:    Medication: Atrovent 1mg  (Neb)    Diagnosis: UPPER RESPIRATORY INFECTION (ICD-465.9)    Dose: 0.5    Route: po    Exp Date: 08/12    Lot #: [id number]    Patient tolerated medication without complications    Given by: Levon Hedger (April 24, 2010 5:07 PM)  Orders Added: 1)  Nebulizer Tx [phone] 2)  Atrovent 1mg  (Neb) [A2130] 3)  Albuterol Sulfate Sol 1mg  unit dose [Q6578] 4)  Gastroenterology Referral [GI] 5)  Est. Patient Level III [46962]

## 2010-05-19 ENCOUNTER — Telehealth (INDEPENDENT_AMBULATORY_CARE_PROVIDER_SITE_OTHER): Payer: Self-pay | Admitting: Nurse Practitioner

## 2010-05-21 ENCOUNTER — Encounter (INDEPENDENT_AMBULATORY_CARE_PROVIDER_SITE_OTHER): Payer: Self-pay | Admitting: Nurse Practitioner

## 2010-05-26 NOTE — Letter (Signed)
Summary: MAILED REQUESTED RECORDS TO [employer] [employer]  MAILED REQUESTED RECORDS TO [employer] [employer]   Imported By: Arta Bruce 05/21/2010 14:56:58  _____________________________________________________________________  External Attachment:    Type:   Image     Comment:   External Document

## 2010-05-29 ENCOUNTER — Encounter (INDEPENDENT_AMBULATORY_CARE_PROVIDER_SITE_OTHER): Payer: Self-pay | Admitting: Nurse Practitioner

## 2010-05-29 ENCOUNTER — Encounter: Payer: Self-pay | Admitting: Nurse Practitioner

## 2010-06-02 LAB — CBC
MCHC: 33.4 g/dL (ref 30.0–36.0)
MCHC: 33.5 g/dL (ref 30.0–36.0)
MCV: 90.5 fL (ref 78.0–100.0)
Platelets: 138 10*3/uL — ABNORMAL LOW (ref 150–400)
Platelets: 172 10*3/uL (ref 150–400)
RBC: 3 MIL/uL — ABNORMAL LOW (ref 3.87–5.11)
RBC: 3.49 MIL/uL — ABNORMAL LOW (ref 3.87–5.11)
RDW: 13.6 % (ref 11.5–15.5)
RDW: 14 % (ref 11.5–15.5)

## 2010-06-02 LAB — DIFFERENTIAL
Basophils Absolute: 0 10*3/uL (ref 0.0–0.1)
Lymphocytes Relative: 23 % (ref 12–46)
Neutro Abs: 4.5 10*3/uL (ref 1.7–7.7)
Neutrophils Relative %: 70 % (ref 43–77)

## 2010-06-02 LAB — BASIC METABOLIC PANEL
BUN: 10 mg/dL (ref 6–23)
BUN: 9 mg/dL (ref 6–23)
CO2: 24 mEq/L (ref 19–32)
CO2: 31 mEq/L (ref 19–32)
Calcium: 7.8 mg/dL — ABNORMAL LOW (ref 8.4–10.5)
Calcium: 8.7 mg/dL (ref 8.4–10.5)
Chloride: 105 mEq/L (ref 96–112)
Creatinine, Ser: 0.56 mg/dL (ref 0.4–1.2)
Creatinine, Ser: 0.58 mg/dL (ref 0.4–1.2)
Creatinine, Ser: 0.59 mg/dL (ref 0.4–1.2)
GFR calc Af Amer: >60 mL/min (ref >60)
GFR calc Af Amer: >60 mL/min (ref >60)
GFR calc Af Amer: >60 mL/min (ref >60)

## 2010-06-02 LAB — TYPE AND SCREEN: ABO/RH(D): A POS

## 2010-06-02 LAB — PROTIME-INR
INR: 0.98 (ref 0.00–1.49)
Prothrombin Time: 12.9 seconds (ref 11.6–15.2)

## 2010-06-02 LAB — URINALYSIS, ROUTINE W REFLEX MICROSCOPIC
Ketones, ur: NEGATIVE mg/dL
Protein, ur: NEGATIVE mg/dL
Urobilinogen, UA: 0.2 mg/dL (ref 0.0–1.0)

## 2010-06-02 LAB — APTT: aPTT: 29 seconds (ref 24–37)

## 2010-06-02 NOTE — Progress Notes (Signed)
Summary: WANTS REFERRAL--2 CALLS ALREADY  Phone Note Call from Patient Call back at Home Phone [phone]   Reason for Call: Acute Illness, Talk to Doctor, Referral Summary of Call: PT SAID HER IMMUNE SYSTEM IS OUT OF WHACK. PT SAID SHE IS LOSING HER BALANCE, WAKING UP WITH HEADACHES, BRUISES RASH LIKE AREAS ON HER BODY, PT WANTS A REFERRAL TO FIND OUT WHAT IS GOING ON. PT ALSO SAID SHE HAS HEARD NOTHING ABOUT HER DENTAL REFERRAL.  PT HAS CALLED [facility] A SECOND TIME SAID SHE IS BLOOD RED FROM SCRATCHING SO MUCH. Initial call taken by: Ayesha Rumpf,  May 19, 2010 8:39 AM  Follow-up for Phone Call        Left message on answering machine for pt. to return call.  Dutch Quint RN  May 19, 2010 5:09 PM  Left message on answering machine for pt. to return call.  Dutch Quint RN  May 22, 2010 10:30 AM  Left message on answering machine for pt. to return call.  Dutch Quint RN  May 26, 2010 11:24 AM  Not having hives this week like she did last week.  Arm is improving a little.  Still has a few bruises.  She called ortho and he changed her med from tramadol to hydrocodone 5-325.  Since she started taking the hydrocodone on Saturday, hives have diminished.  Is going to orthopedist tomorrow.  Today she is extremely tired.  Wants to know if she can be referred to an immunologist to find out what's going on with her system.   Also wanted to know status of dental referral -- her dentures are cracked.  Advised that cracked dentures are unable to get referral -- she would have to find a dentist on her own.  Verbalized understanding.    Follow-up by: Dutch Quint RN,  May 26, 2010 4:31 PM  Additional Follow-up for Phone Call Additional follow up Details #1::        Unsure what kind of referral pt would need - she needs an appt with provider to sort out regarding dental - dental funding has been decreased and unfortunately they are not accepting any new referrals at this time.  (Ask pt if she  has any signs of absecess - fever, extreme swelling of jaw, headache) if she just needs routine care offer # to [facility] or [facility] for routine care Additional Follow-up by: Lehman Prom FNP,  May 27, 2010 12:46 PM    Additional Follow-up for Phone Call Additional follow up Details #2::    Advised pt. re dental care - verbalized understanding and agreement, states no infectious processes at this time.   OV scheduled 05/29/10.  Dutch Quint RN  May 27, 2010 5:09 PM

## 2010-06-02 NOTE — Assessment & Plan Note (Signed)
Summary: Anxiety   Vital Signs:  Patient profile:   60 year old female Menstrual status:  hysterectomy Weight:      183.19 pounds Temp:     98.0 degrees F oral Pulse rate:   76 / minute Pulse rhythm:   regular Resp:     18 per minute BP sitting:   123 / 74  (right arm) Cuff size:   regular  Vitals Entered By: Hale Drone CMA (May 29, 2010 4:09 PM) CC: Concerned about her immune system. She is still having Hives that are itchy.... Recently saw Dr. Otelia Sergeant (Asthma&Allergy) and he gave her an Antibiotic for a stomach infection... Also saw Dr. Cornelius Moras and he took her of Tramadol and prescribed her Hydrocodone. , Lipid Management, Depression Is Patient Diabetic? No Pain Assessment Patient in pain? no       Does patient need assistance? Functional Status Self care Ambulation Normal     Menstrual Status hysterectomy Last PAP Result NEGATIVE FOR INTRAEPITHELIAL LESIONS OR MALIGNANCY. BENIGN REACTIVE/REPARATIVE CHANGES.   Primary Care Provider:  Tereso Newcomer PA-C  CC:  Concerned about her immune system. She is still having Hives that are itchy.... Recently saw Dr. Otelia Sergeant (Asthma&Allergy) and he gave her an Antibiotic for a stomach infection... Also saw Dr. Cornelius Moras and he took her of Tramadol and prescribed her Hydrocodone. , Lipid Management, and Depression.  History of Present Illness:  Pt into the office with c/o hives and slotches. She has been to the allergist as ordered. She is taking all the medications as ordered  Ortho - last visit 1 week ago, s/p left hip replacment, still with some inflammation for which she will restart therapy Psychosocial stress factors include major life changes.  The patient denies that she feels like life is not worth living, denies that she wishes that she were dead, and denies that she has thought about ending her life.        Comments:  Pt was previously been going with Dr. Delanna Notice (2007) and she had iodine radiation and shortly after that she was  found to breast cancer.  Depression Treatment History:  Prior Medication Used:   Start Date: Assessment of Effect:   Comments:  Celexa (citalopram)     --     some improvement     increase dose to 40  Lipid Management History:      Positive NCEP/ATP III risk factors include female age 68 years old or older and current tobacco user.  Negative NCEP/ATP III risk factors include non-diabetic, non-hypertensive, no ASHD (atherosclerotic heart disease), no prior stroke/TIA, no peripheral vascular disease, and no history of aortic aneurysm.        The patient states that she knows about the "Therapeutic Lifestyle Change" diet.  The patient does not know about adjunctive measures for cholesterol lowering.  She expresses no side effects from her lipid-lowering medication.  The patient denies any symptoms to suggest myopathy or liver disease.      Habits & Providers  Alcohol-Tobacco-Diet     Alcohol drinks/day: 0     Tobacco Status: current     Tobacco Counseling: to quit use of tobacco products     Cigarette Packs/Day: 1.0  Exercise-Depression-Behavior     Does Patient Exercise: no     Have you felt down or hopeless? yes     Have you felt little pleasure in things? yes     Depression Counseling: not indicated; screening negative for depression     Drug Use: never  Seat Belt Use: always  Comments: Mental health team worker into the office to speak with pt  Current Medications (verified): 1)  Alprazolam 0.5 Mg Tabs (Alprazolam) .... One Tablet By Mouth Two Times A Day As Needed For Nerves 2)  Simvastatin 40 Mg Tabs (Simvastatin) .... One Tab By Mouth Every Night At Bedtime 3)  Tamoxifen Citrate 20 Mg Tabs (Tamoxifen Citrate) .... One Tab By Mouth Every Day 4)  Proventil Hfa 108 (90 Base) Mcg/act Aers (Albuterol Sulfate) .[name].. 1-2 Puffs Every 4-6 Hours As Needed 5)  Celexa 20 Mg Tabs (Citalopram Hydrobromide) .... Take 1 Tablet By Mouth Once A Day 6)  Levothyroxine Sodium 25 Mcg Tabs  (Levothyroxine Sodium) .... Do Not Take For Now 7)  Advair Diskus 100-50 Mcg/dose Aepb (Fluticasone-Salmeterol) .[name].. 1 Inhalation Two Times A Day ([facility]: [facility]) 8)  Risk manager .... Use As Directed 9)  Ultracet 37.5-325 Mg Tabs (Tramadol-Acetaminophen) .... Take 1 By Mouth Twice Daily 10)  Zyrtec Hives Relief 10 Mg Tabs (Cetirizine Hcl) .... One Tablet By Mouth Two Times A Day **rx By Allergist** 11)  Triamcinolone Acetonide 0.1 % Crea (Triamcinolone Acetonide) .... Apply Thin Amount of Cream To Hives Two Times A Day As Needed For Itching 12)  Trazodone Hcl 50 Mg Tabs (Trazodone Hcl) .... Take One At Bedtime As Needed For Sleep 13)  Omnaris 50 Mcg/act Susp (Ciclesonide) .... One Spray Two Times A Day  **rx By Allergist** 14)  Ranitidine Hcl 300 Mg Tabs (Ranitidine Hcl) .... One Tablet By Mouth Nightly For Stomach **rx By Allergist** 15)  Singulair 10 Mg Tabs (Montelukast Sodium) .... One Tablet By Mouth Daily  **rx By Allergist** 16)  Promethazine-Dm 6.25-15 Mg/34ml Syrp (Promethazine-Dm) .... One Teaspoon Every 6 Hours As Needed For Cough/congestion  Allergies (verified): 1)  ! Asa 2)  ! Ibuprofen  Review of Systems General:  Denies sleep disorder; doing much better with trazodone. CV:  Denies fatigue. Resp:  Denies cough and shortness of breath; improved since last visit. GI:  Denies abdominal pain, nausea, and vomiting. Derm:  Complains of itching and rash. Psych:  Complains of anxiety.  Physical Exam  General:  alert.   Head:  normocephalic.   Msk:  normal ROM.   Neurologic:  alert & oriented X3.   Psych:  Oriented X3.     Impression & Recommendations:  Problem # 1:  DEPRESSION (ICD-311) will increase celexa to 40mg  by mouth daily Her updated medication list for this problem includes:    Alprazolam 0.5 Mg Tabs (Alprazolam) ..... One tablet by mouth two times a day as needed for nerves    Citalopram Hydrobromide 40 Mg Tabs (Citalopram hydrobromide)  ..... One tablet by mouth daily for mood **note increase in dose**    Trazodone Hcl 50 Mg Tabs (Trazodone hcl) .[name]... Take one at bedtime as needed for sleep  Problem # 2:  ANXIETY (ICD-300.00) Mental health into the room for brief intervention. pt to set up appt for ongoing treatment Her updated medication list for this problem includes:    Alprazolam 0.5 Mg Tabs (Alprazolam) ..... One tablet by mouth two times a day as needed for nerves    Citalopram Hydrobromide 40 Mg Tabs (Citalopram hydrobromide) ..... One tablet by mouth daily for mood **note increase in dose**    Trazodone Hcl 50 Mg Tabs (Trazodone hcl) .[name]... Take one at bedtime as needed for sleep  Complete Medication List: 1)  Alprazolam 0.5 Mg Tabs (Alprazolam) .... One tablet  by mouth two times a day as needed for nerves 2)  Simvastatin 40 Mg Tabs (Simvastatin) .... One tab by mouth every night at bedtime 3)  Tamoxifen Citrate 20 Mg Tabs (Tamoxifen citrate) .... One tab by mouth every day 4)  Proventil Hfa 108 (90 Base) Mcg/act Aers (Albuterol sulfate) .[name].. 1-2 puffs every 4-6 hours as needed 5)  Citalopram Hydrobromide 40 Mg Tabs (Citalopram hydrobromide) .... One tablet by mouth daily for mood **note increase in dose** 6)  Levothyroxine Sodium 25 Mcg Tabs (Levothyroxine sodium) .... Do not take for now 7)  Advair Diskus 100-50 Mcg/dose Aepb (Fluticasone-salmeterol) .[name].. 1 inhalation two times a day ([facility]: [facility]) 8)  Risk manager  .... Use as directed 9)  Ultracet 37.5-325 Mg Tabs (Tramadol-acetaminophen) .... Take 1 by mouth twice daily 10)  Zyrtec Hives Relief 10 Mg Tabs (Cetirizine hcl) .... One tablet by mouth two times a day **rx by allergist** 11)  Triamcinolone Acetonide 0.1 % Crea (Triamcinolone acetonide) .... Apply thin amount of cream to hives two times a day as needed for itching 12)  Trazodone Hcl 50 Mg Tabs (Trazodone hcl) .... Take one at bedtime as needed for sleep 13)  Omnaris 50 Mcg/act  Susp (Ciclesonide) .... One spray two times a day  **rx by allergist** 14)  Ranitidine Hcl 300 Mg Tabs (Ranitidine hcl) .... One tablet by mouth nightly for stomach **rx by allergist** 15)  Singulair 10 Mg Tabs (Montelukast sodium) .... One tablet by mouth daily  **rx by allergist**  Lipid Assessment/Plan:      Based on NCEP/ATP III, the patient's risk factor category is "0-1 risk factors".  The patient's lipid goals are as follows: Total cholesterol goal is 200; LDL cholesterol goal is 130; HDL cholesterol goal is 40; Triglyceride goal is 150.    Patient Instructions: 1)  Keep taking your medication as ordered 2)  You will speak with the mental health worker today. Start with the plan on counseling or perhaps medication adjustment Prescriptions: CITALOPRAM HYDROBROMIDE 40 MG TABS (CITALOPRAM HYDROBROMIDE) One tablet by mouth daily for mood **note increase in dose**  #30 x 5   Entered and Authorized by:   Lehman Prom FNP   Signed by:   Lehman Prom FNP on 05/29/2010   Method used:   Electronically to        [facility]  [address] [phone][facility] (retail)       [address]       [place], [place]  [address]       Ph: [phone]       Fax: [phone]   RxID:   [id number]    Orders Added: 1)  Est. Patient Level III [44034]

## 2010-06-03 ENCOUNTER — Encounter (INDEPENDENT_AMBULATORY_CARE_PROVIDER_SITE_OTHER): Payer: Self-pay | Admitting: Nurse Practitioner

## 2010-06-04 LAB — CONVERTED CEMR LAB
Cholesterol: 169 mg/dL (ref 0–200)
T3, Free: 3.1 pg/mL (ref 2.3–4.2)
Triglycerides: 110 mg/dL (ref <150)

## 2010-06-09 ENCOUNTER — Ambulatory Visit: Payer: Medicaid Other | Admitting: Physical Therapy

## 2010-06-10 ENCOUNTER — Ambulatory Visit: Payer: Medicaid Other | Attending: Physician Assistant | Admitting: Physical Therapy

## 2010-06-10 DIAGNOSIS — M6281 Muscle weakness (generalized): Secondary | ICD-10-CM | POA: Insufficient documentation

## 2010-06-10 DIAGNOSIS — M25559 Pain in unspecified hip: Secondary | ICD-10-CM | POA: Insufficient documentation

## 2010-06-10 DIAGNOSIS — R262 Difficulty in walking, not elsewhere classified: Secondary | ICD-10-CM | POA: Insufficient documentation

## 2010-06-10 DIAGNOSIS — IMO0001 Reserved for inherently not codable concepts without codable children: Secondary | ICD-10-CM | POA: Insufficient documentation

## 2010-06-10 DIAGNOSIS — M2560 Stiffness of unspecified joint, not elsewhere classified: Secondary | ICD-10-CM | POA: Insufficient documentation

## 2010-06-17 ENCOUNTER — Ambulatory Visit: Payer: Medicaid Other | Attending: Physician Assistant

## 2010-06-17 DIAGNOSIS — IMO0001 Reserved for inherently not codable concepts without codable children: Secondary | ICD-10-CM | POA: Insufficient documentation

## 2010-06-17 DIAGNOSIS — M25559 Pain in unspecified hip: Secondary | ICD-10-CM | POA: Insufficient documentation

## 2010-06-17 DIAGNOSIS — R262 Difficulty in walking, not elsewhere classified: Secondary | ICD-10-CM | POA: Insufficient documentation

## 2010-06-17 DIAGNOSIS — M2560 Stiffness of unspecified joint, not elsewhere classified: Secondary | ICD-10-CM | POA: Insufficient documentation

## 2010-06-17 DIAGNOSIS — M6281 Muscle weakness (generalized): Secondary | ICD-10-CM | POA: Insufficient documentation

## 2010-06-24 ENCOUNTER — Ambulatory Visit: Payer: Medicaid Other

## 2010-07-08 ENCOUNTER — Ambulatory Visit: Payer: Medicaid Other | Admitting: Physical Therapy

## 2010-07-21 ENCOUNTER — Encounter: Payer: Medicaid Other | Admitting: Physical Therapy

## 2010-07-28 NOTE — Op Note (Signed)
NAME:  [name], [name]             ACCOUNT NO.:  [id number]   MEDICAL RECORD NO.:  [id number]          PATIENT TYPE:  AMB   LOCATION:  DSC                          FACILITY:  [facility]   PHYSICIAN:  Sandria Bales. Ezzard Standing, M.D.  DATE OF BIRTH:  1950-04-28   DATE OF PROCEDURE:  11/25/2006  DATE OF DISCHARGE:                               OPERATIVE REPORT   PREOPERATIVE DIAGNOSIS:  Ductal carcinoma in situ of the right breast at  the 5 to 6 o'clock position.   POSTOPERATIVE DIAGNOSIS:  Ductal carcinoma in situ of the right breast  at the 5 to 6 o'clock position, final pathology pending.   PROCEDURE:  Right breast lumpectomy using needle localization x2.   SURGEON:  Sandria Bales. Ezzard Standing, M.D.   FIRST ASSISTANT:  None.   ANESTHESIA:  General LMA anesthesia with 0.25% Marcaine, 30 mL.   COMPLICATIONS:  None.   INDICATIONS FOR PROCEDURE:  [name] [name] is a 60 year old white female  who [facility] and was found  to have abnormal microcalcifications in the inferior aspect of her right  breast.  She then underwent an MRI and biopsy of this showed a ductal  carcinoma in situ of the right breast that appears to cover an area of  about 3.5 cm in the inferior aspect of her right breast.  She now comes  for wide excision of this ductal carcinoma in situ.  She underwent a  wire localization by Dr. Rosalie Gums at the [facility] with two wires  up emanating from the inferior aspect of her left breast.   The indications, and potential complications were explained to the  patient.  The potential complications include bleeding, infection, nerve  injury, possible recurrent tumor, and the need for further surgery.   OPERATIVE NOTE:  Again, [name] [name] presented to the operating room.  She had two wires emanating from the inferior aspect of her left breast.  The breast was prepped with Betadine solution and sterilely draped.  An  infraareolar incision was made with  sharp dissection and carried down  excising a block of breast tissue which was about 5-6 cm in diameter.  I  went down to the chest wall, went up under the nipple and got what I  thought were both wires adequately out.   I sent it for specimen mammogram which confirmed the entire area  suspicious on mammography was excised.  Actually, anteriorly, I also  excised an additional piece of tissue. For the main breast specimen, I  marked a long suture medial, the wires came out inferiorly, there was  skin attached to the specimen, and a short suture cranial. For the piece  of additional tissue, I placed a piece of suture on the cranial side and  it abutted the cranial side of the breast specimen.   The wound was then irrigated and hemostasis controlled with Bovie  electrocautery.  I infiltrated the subcutaneous tissues and breast with  0.25% Marcaine.  Again, the [facility] called with specimen gram  conferring the specimen containing the worrisome calcium.  I closed the  wound with interrupted 3-0 Vicryl sutures and a 5-0 subcuticular suture  for the skin.  Steri-Strips were applied to the wound.   The patient tolerated the procedure well and was transported to the  recovery room in good condition.  Sponge and needle count were correct.      Sandria Bales. Ezzard Standing, M.D.  Electronically Signed     DHN/MEDQ  D:  11/25/2006  T:  11/26/2006  Job:  [id number]   cc:   Olene Craven, M.D.  Norva Pavlov, M.D.

## 2010-10-06 ENCOUNTER — Emergency Department (HOSPITAL_COMMUNITY): Payer: No Typology Code available for payment source

## 2010-10-06 ENCOUNTER — Emergency Department (HOSPITAL_COMMUNITY)
Admission: EM | Admit: 2010-10-06 | Discharge: 2010-10-06 | Disposition: A | Discharge status: Home / Self Care | Payer: No Typology Code available for payment source | Attending: General Surgery | Admitting: General Surgery

## 2010-10-06 ENCOUNTER — Encounter (HOSPITAL_COMMUNITY): Payer: Self-pay | Admitting: Radiology

## 2010-10-06 DIAGNOSIS — S301XXA Contusion of abdominal wall, initial encounter: Secondary | ICD-10-CM | POA: Insufficient documentation

## 2010-10-06 DIAGNOSIS — Z853 Personal history of malignant neoplasm of breast: Secondary | ICD-10-CM | POA: Insufficient documentation

## 2010-10-06 DIAGNOSIS — E039 Hypothyroidism, unspecified: Secondary | ICD-10-CM | POA: Insufficient documentation

## 2010-10-06 DIAGNOSIS — R413 Other amnesia: Secondary | ICD-10-CM | POA: Insufficient documentation

## 2010-10-06 DIAGNOSIS — R4182 Altered mental status, unspecified: Secondary | ICD-10-CM | POA: Insufficient documentation

## 2010-10-06 DIAGNOSIS — S1093XA Contusion of unspecified part of neck, initial encounter: Secondary | ICD-10-CM | POA: Insufficient documentation

## 2010-10-06 DIAGNOSIS — S2000XA Contusion of breast, unspecified breast, initial encounter: Secondary | ICD-10-CM | POA: Insufficient documentation

## 2010-10-06 DIAGNOSIS — R51 Headache: Secondary | ICD-10-CM | POA: Insufficient documentation

## 2010-10-06 DIAGNOSIS — R079 Chest pain, unspecified: Secondary | ICD-10-CM | POA: Insufficient documentation

## 2010-10-06 DIAGNOSIS — Z79899 Other long term (current) drug therapy: Secondary | ICD-10-CM | POA: Insufficient documentation

## 2010-10-06 DIAGNOSIS — S0180XA Unspecified open wound of other part of head, initial encounter: Secondary | ICD-10-CM | POA: Insufficient documentation

## 2010-10-06 DIAGNOSIS — R109 Unspecified abdominal pain: Secondary | ICD-10-CM | POA: Insufficient documentation

## 2010-10-06 DIAGNOSIS — F411 Generalized anxiety disorder: Secondary | ICD-10-CM | POA: Insufficient documentation

## 2010-10-06 DIAGNOSIS — S0003XA Contusion of scalp, initial encounter: Secondary | ICD-10-CM | POA: Insufficient documentation

## 2010-10-06 DIAGNOSIS — S20219A Contusion of unspecified front wall of thorax, initial encounter: Secondary | ICD-10-CM | POA: Insufficient documentation

## 2010-10-06 HISTORY — DX: Malignant (primary) neoplasm, unspecified: C80.1

## 2010-10-06 LAB — URINALYSIS, ROUTINE W REFLEX MICROSCOPIC
Glucose, UA: NEGATIVE mg/dL
Leukocytes, UA: NEGATIVE
Specific Gravity, Urine: 1.016 (ref 1.005–1.030)
pH: 6 (ref 5.0–8.0)

## 2010-10-06 LAB — URINE MICROSCOPIC-ADD ON

## 2010-10-06 LAB — CBC
HCT: 39.3 % (ref 36.0–46.0)
MCV: 89.3 fL (ref 78.0–100.0)
RBC: 4.4 MIL/uL (ref 3.87–5.11)
WBC: 9.8 10*3/uL (ref 4.0–10.5)

## 2010-10-06 LAB — DIFFERENTIAL
Lymphocytes Relative: 20 % (ref 12–46)
Lymphs Abs: 2 10*3/uL (ref 0.7–4.0)
Neutro Abs: 7.2 10*3/uL (ref 1.7–7.7)
Neutrophils Relative %: 73 % (ref 43–77)

## 2010-10-06 LAB — RAPID URINE DRUG SCREEN, HOSP PERFORMED
Amphetamines: NOT DETECTED
Opiates: NOT DETECTED

## 2010-10-06 LAB — COMPREHENSIVE METABOLIC PANEL
AST: 17 U/L (ref 0–37)
BUN: 11 mg/dL (ref 6–23)
CO2: 27 meq/L (ref 19–32)
Calcium: 8.8 mg/dL (ref 8.4–10.5)
Creatinine, Ser: 0.49 mg/dL — ABNORMAL LOW (ref 0.50–1.10)
GFR calc Af Amer: >60 mL/min (ref >60)
GFR calc non Af Amer: >60 mL/min (ref >60)

## 2010-10-06 LAB — ETHANOL: Alcohol, Ethyl (B): <11 mg/dL (ref 0–11)

## 2010-10-06 LAB — APTT: aPTT: 25 s (ref 24–37)

## 2010-10-06 LAB — LACTIC ACID, PLASMA: Lactic Acid, Venous: 0.7 mmol/L (ref 0.5–2.2)

## 2010-10-06 MED ORDER — IOHEXOL 300 MG/ML  SOLN
100.0000 mL | Freq: Once | INTRAMUSCULAR | Status: AC | PRN
  Administered 2010-10-06: 100 mL via INTRAVENOUS

## 2010-10-13 ENCOUNTER — Other Ambulatory Visit: Payer: Self-pay | Admitting: Hematology and Oncology

## 2010-10-13 ENCOUNTER — Encounter (HOSPITAL_BASED_OUTPATIENT_CLINIC_OR_DEPARTMENT_OTHER): Payer: Medicaid Other | Admitting: Hematology and Oncology

## 2010-10-13 DIAGNOSIS — D0590 Unspecified type of carcinoma in situ of unspecified breast: Secondary | ICD-10-CM

## 2010-10-13 DIAGNOSIS — Z170 Estrogen receptor positive status [ER+]: Secondary | ICD-10-CM

## 2010-10-13 LAB — COMPREHENSIVE METABOLIC PANEL
ALT: 9 U/L (ref 0–35)
Albumin: 4.1 g/dL (ref 3.5–5.2)
Alkaline Phosphatase: 71 U/L (ref 39–117)
CO2: 25 mEq/L (ref 19–32)
Glucose, Bld: 95 mg/dL (ref 70–99)
Potassium: 3.7 mEq/L (ref 3.5–5.3)
Sodium: 138 mEq/L (ref 135–145)
Total Bilirubin: 0.5 mg/dL (ref 0.3–1.2)
Total Protein: 6.6 g/dL (ref 6.0–8.3)

## 2010-10-13 LAB — CBC WITH DIFFERENTIAL/PLATELET
BASO%: 0.2 % (ref 0.0–2.0)
Eosinophils Absolute: 0 10*3/uL (ref 0.0–0.5)
LYMPH%: 25.8 % (ref 14.0–49.7)
MCHC: 33.8 g/dL (ref 31.5–36.0)
MONO#: 0.5 10*3/uL (ref 0.1–0.9)
NEUT#: 3.6 10*3/uL (ref 1.5–6.5)
RBC: 4.13 10*6/uL (ref 3.70–5.45)
RDW: 14.5 % (ref 11.2–14.5)
WBC: 5.5 10*3/uL (ref 3.9–10.3)

## 2010-10-13 LAB — CANCER ANTIGEN 27.29: CA 27.29: 15 U/mL (ref 0–39)

## 2010-10-13 LAB — LACTATE DEHYDROGENASE: LDH: 194 U/L (ref 94–250)

## 2010-10-14 ENCOUNTER — Encounter (HOSPITAL_BASED_OUTPATIENT_CLINIC_OR_DEPARTMENT_OTHER): Payer: Medicaid Other | Admitting: Hematology and Oncology

## 2010-10-14 DIAGNOSIS — D0590 Unspecified type of carcinoma in situ of unspecified breast: Secondary | ICD-10-CM

## 2010-10-14 DIAGNOSIS — Z170 Estrogen receptor positive status [ER+]: Secondary | ICD-10-CM

## 2010-11-23 ENCOUNTER — Other Ambulatory Visit: Payer: Self-pay | Admitting: Hematology and Oncology

## 2010-11-23 DIAGNOSIS — Z853 Personal history of malignant neoplasm of breast: Secondary | ICD-10-CM

## 2010-11-23 DIAGNOSIS — Z9889 Other specified postprocedural states: Secondary | ICD-10-CM

## 2010-12-25 ENCOUNTER — Inpatient Hospital Stay (INDEPENDENT_AMBULATORY_CARE_PROVIDER_SITE_OTHER)
Admission: RE | Admit: 2010-12-25 | Discharge: 2010-12-25 | Disposition: A | Discharge status: Home / Self Care | Payer: Medicaid Other | Source: Ambulatory Visit | Attending: Emergency Medicine | Admitting: Emergency Medicine

## 2010-12-25 ENCOUNTER — Ambulatory Visit
Admission: RE | Admit: 2010-12-25 | Discharge: 2010-12-25 | Disposition: A | Discharge status: Home / Self Care | Payer: Medicaid Other | Source: Ambulatory Visit | Attending: Hematology and Oncology | Admitting: Hematology and Oncology

## 2010-12-25 DIAGNOSIS — Z853 Personal history of malignant neoplasm of breast: Secondary | ICD-10-CM

## 2010-12-25 DIAGNOSIS — M7040 Prepatellar bursitis, unspecified knee: Secondary | ICD-10-CM

## 2010-12-25 DIAGNOSIS — Z9889 Other specified postprocedural states: Secondary | ICD-10-CM

## 2010-12-25 DIAGNOSIS — M79609 Pain in unspecified limb: Secondary | ICD-10-CM

## 2010-12-25 LAB — DIFFERENTIAL
Basophils Absolute: 0
Eosinophils Relative: 0
Lymphocytes Relative: 38
Lymphs Abs: 2
Neutro Abs: 2.7
Neutrophils Relative %: 52

## 2010-12-25 LAB — POCT HEMOGLOBIN-HEMACUE
Hemoglobin: 14.3
Operator id: 116011

## 2010-12-25 LAB — CBC
HCT: 39.4
Hemoglobin: 13.6
RBC: 4.44

## 2010-12-25 LAB — COMPREHENSIVE METABOLIC PANEL
AST: 16
BUN: 7
CO2: 27
Calcium: 9
Chloride: 109
Creatinine, Ser: 0.64
GFR calc non Af Amer: >60
Glucose, Bld: 80
Total Bilirubin: 0.3

## 2011-03-25 ENCOUNTER — Telehealth: Payer: Self-pay | Admitting: Hematology and Oncology

## 2011-03-25 NOTE — Telephone Encounter (Signed)
pt aware of 04/21/11 appt,from pof 10/14/10   aom

## 2011-04-12 ENCOUNTER — Encounter: Payer: Self-pay | Admitting: Nurse Practitioner

## 2011-04-21 ENCOUNTER — Ambulatory Visit (HOSPITAL_BASED_OUTPATIENT_CLINIC_OR_DEPARTMENT_OTHER): Payer: Self-pay | Admitting: Hematology and Oncology

## 2011-04-21 ENCOUNTER — Telehealth: Payer: Self-pay | Admitting: Hematology and Oncology

## 2011-04-21 ENCOUNTER — Other Ambulatory Visit: Payer: Self-pay | Admitting: Lab

## 2011-04-21 ENCOUNTER — Other Ambulatory Visit: Payer: Self-pay | Admitting: *Deleted

## 2011-04-21 VITALS — BP 118/75 | HR 101 | Temp 98.1°F

## 2011-04-21 DIAGNOSIS — D0590 Unspecified type of carcinoma in situ of unspecified breast: Secondary | ICD-10-CM

## 2011-04-21 DIAGNOSIS — Z170 Estrogen receptor positive status [ER+]: Secondary | ICD-10-CM

## 2011-04-21 DIAGNOSIS — D0510 Intraductal carcinoma in situ of unspecified breast: Secondary | ICD-10-CM

## 2011-04-21 LAB — CBC WITH DIFFERENTIAL/PLATELET
EOS%: 0 % (ref 0.0–7.0)
Eosinophils Absolute: 0 10*3/uL (ref 0.0–0.5)
HGB: 13.9 g/dL (ref 11.6–15.9)
MCH: 30.2 pg (ref 25.1–34.0)
MCV: 90.2 fL (ref 79.5–101.0)
MONO%: 7.7 % (ref 0.0–14.0)
NEUT#: 4.4 10*3/uL (ref 1.5–6.5)
RBC: 4.61 10*6/uL (ref 3.70–5.45)
RDW: 13.8 % (ref 11.2–14.5)
lymph#: 1.5 10*3/uL (ref 0.9–3.3)

## 2011-04-21 LAB — COMPREHENSIVE METABOLIC PANEL
AST: 17 U/L (ref 0–37)
Albumin: 4.2 g/dL (ref 3.5–5.2)
Alkaline Phosphatase: 82 U/L (ref 39–117)
Calcium: 9.2 mg/dL (ref 8.4–10.5)
Chloride: 102 mEq/L (ref 96–112)
Potassium: 3.8 mEq/L (ref 3.5–5.3)
Sodium: 139 mEq/L (ref 135–145)
Total Protein: 6.8 g/dL (ref 6.0–8.3)

## 2011-04-21 MED ORDER — TAMOXIFEN CITRATE 20 MG PO TABS: 20.0000 mg | ORAL_TABLET | Freq: Every day | ORAL | Status: DC

## 2011-04-21 NOTE — Progress Notes (Signed)
CC:   Merlene Laughter. Renae Gloss, M.D.  IDENTIFYING STATEMENT:  Patient is a 61 year old woman with history of DCIS of the right breast who presents for followup.  INTERVAL HISTORY:  Mrs. Smolinski was last seen 5 months ago, is on tamoxifen which she is tolerating with very minimal complaints.  She is without nausea, vomiting, and vaginal discharge.  She has not noted any new breast lumps or skin lesions.  Her mammogram on December 25, 2010 showed no evidence for malignancy.  MEDICATIONS:  Reviewed and updated.  ALLERGIES:  Aleve and ibuprofen.  PHYSICAL EXAM:  General:  Patient is well-appearing woman in no distress.  Vitals:  Pulse 101, blood pressure 118/75, temperature 98.1, respirations 20.  HEENT:  Head is atraumatic, normocephalic.  Sclerae anicteric.  Mouth is moist.  Neck:  Supple.  Chest:  Clear to percussion and auscultation.  CVS:  First and second heart sounds present. Breasts: Notes skin changes secondary to radiation in the right breast. Otherwise, there are no palpable masses or nipple discharge.  Left breast unremarkable.  Abdomen:  Soft, nontender.  Bowel sounds present. Extremities:  No edema.  Pulses present and symmetrical.  LAB DATA:  On 04/21/2011 white cell count 6.4, hemoglobin 13.9, hematocrit 41.6, platelets 209.  C-MET and CA 27-29 pending.  IMPRESSION AND PLAN:  Mrs. Napp is a 61 year old woman with a history of DCIS of the right breast diagnosed in September 2008.  Status post lumpectomy on 03/11/2007 followed by external radiation therapy which she completed on February 13, 2007.  She began tamoxifen in January 2009 and is tolerating it with very minimal difficulties.  Recent mammogram unremarkable.  She is to continue with tamoxifen.  She follows up in 6 months' time.  She was encouraged to discontinue smoking to avoid health-related issues.     ______________________________ Laurice Record, M.D. LIO/MEDQ  D:  04/21/2011  T:  04/21/2011  Job:   [id number]

## 2011-04-21 NOTE — Progress Notes (Signed)
This office note has been dictated.

## 2011-04-21 NOTE — Telephone Encounter (Signed)
gv pt appt for aug2013 °

## 2011-11-05 ENCOUNTER — Other Ambulatory Visit (HOSPITAL_BASED_OUTPATIENT_CLINIC_OR_DEPARTMENT_OTHER): Payer: Medicaid Other | Admitting: Lab

## 2011-11-05 DIAGNOSIS — D0590 Unspecified type of carcinoma in situ of unspecified breast: Secondary | ICD-10-CM

## 2011-11-05 DIAGNOSIS — D0510 Intraductal carcinoma in situ of unspecified breast: Secondary | ICD-10-CM

## 2011-11-05 LAB — COMPREHENSIVE METABOLIC PANEL
ALT: 14 U/L (ref 0–35)
AST: 13 U/L (ref 0–37)
Alkaline Phosphatase: 90 U/L (ref 39–117)
Glucose, Bld: 86 mg/dL (ref 70–99)
Sodium: 140 mEq/L (ref 135–145)
Total Bilirubin: 0.4 mg/dL (ref 0.3–1.2)
Total Protein: 6.7 g/dL (ref 6.0–8.3)

## 2011-11-05 LAB — CBC WITH DIFFERENTIAL/PLATELET
BASO%: 0 % (ref 0.0–2.0)
EOS%: 0 % (ref 0.0–7.0)
LYMPH%: 28.8 % (ref 14.0–49.7)
MCH: 30.2 pg (ref 25.1–34.0)
MCHC: 33.7 g/dL (ref 31.5–36.0)
MCV: 89.7 fL (ref 79.5–101.0)
MONO%: 9.4 % (ref 0.0–14.0)
Platelets: 215 10*3/uL (ref 145–400)
RBC: 4.54 10*6/uL (ref 3.70–5.45)

## 2011-11-09 ENCOUNTER — Ambulatory Visit (HOSPITAL_BASED_OUTPATIENT_CLINIC_OR_DEPARTMENT_OTHER): Payer: Self-pay | Admitting: Hematology and Oncology

## 2011-11-09 ENCOUNTER — Telehealth: Payer: Self-pay | Admitting: Hematology and Oncology

## 2011-11-09 ENCOUNTER — Encounter: Payer: Self-pay | Admitting: Hematology and Oncology

## 2011-11-09 VITALS — BP 133/72 | HR 94 | Temp 98.4°F | Resp 20 | Ht 67.25 in | Wt 181.2 lb

## 2011-11-09 DIAGNOSIS — D0590 Unspecified type of carcinoma in situ of unspecified breast: Secondary | ICD-10-CM

## 2011-11-09 DIAGNOSIS — Z170 Estrogen receptor positive status [ER+]: Secondary | ICD-10-CM

## 2011-11-09 DIAGNOSIS — D0510 Intraductal carcinoma in situ of unspecified breast: Secondary | ICD-10-CM

## 2011-11-09 NOTE — Patient Instructions (Signed)
[name] [name]  [id number]   [facility] - AFTER VISIT SUMMARY   **RECOMMENDATIONS MADE BY THE CONSULTANT AND ANY TEST    RESULTS WILL BE SENT TO YOUR REFERRING DOCTORS.   YOUR EXAM FINDINGS, LABS AND RESULTS WERE DISCUSSED BY YOUR MD TODAY.    Your vital signs are: Filed Vitals:   11/09/11 0858  BP: 133/72  Pulse: 94  Temp: 98.4 F (36.9 C)  Resp: 20    Your Updated drug allergies are: Allergies as of 11/09/2011 - Review Complete 04/21/2011  Allergen Reaction Noted  . Ibuprofen Hives 05/20/2009  . Naproxen sodium Hives 10/06/2010  . Aspirin Hives 05/20/2009    Your current list of medications are: Current Outpatient Prescriptions  Medication Sig Dispense Refill  . fluticasone (VERAMYST) 27.5 MCG/SPRAY nasal spray Place 2 sprays into the nose daily.      . montelukast (SINGULAIR) 10 MG tablet Take 10 mg by mouth at bedtime.      . tamoxifen (NOLVADEX) 20 MG tablet Take 1 tablet (20 mg total) by mouth daily.  90 tablet  3  . temazepam (RESTORIL) 15 MG capsule Take 15 mg by mouth at bedtime as needed.      . traMADol-acetaminophen (ULTRACET) 37.5-325 MG per tablet Take 1 tablet by mouth every 6 (six) hours as needed.      . traZODone (DESYREL) 50 MG tablet Take 50 mg by mouth at bedtime.         INSTRUCTIONS GIVEN AND DISCUSSED:  See attached schedule   SPECIAL INSTRUCTIONS/FOLLOW-UP:  See above.  I acknowledge that I have been informed and understand all the instructions given to me and received a copy. I do not have any more questions at this time, but understand that I may call the [facility] at [phone] during business hours should I have any further questions or need assistance in obtaining follow-up care.

## 2011-11-09 NOTE — Progress Notes (Signed)
CC:   [name] [name]. Renae Gloss, M.D.  IDENTIFYING STATEMENT:  The patient is a 61 year old woman with a history of DCIS of the right breast who presents for followup.  INTERVAL HISTORY:  Ms. Werden was last seen in February 2013.  She offers no complaints pertaining to oncology status.  Is tolerating tamoxifen with very minimal difficulty.  Her weight is stable.  She denies bone pain.  She has not noted any new masses or nodules. Mammogram in October 2012 was unremarkable.  MEDICATIONS:  Tamoxifen 20 mg daily, initiated on 03/25/2007.  Rest of medicines reviewed and updated.  ALLERGIES:  Ibuprofen, naproxen, aspirin.  PHYSICAL EXAMINATION:  General:  The patient is alert and oriented x3. Vitals:  Pulse 94, blood pressure 133/72, temperature 98.4, respirations 20, weight 181.2 pounds.  HEENT:  Head is atraumatic, normocephalic. Sclerae are anicteric.  Mouth moist.  Chest:  Clear.  Abdomen:  Soft, nontender.  Bowel sounds present.  Extremities:  No edema.  Breast Exam: Notes no evidence of masses or nipple discharge.  LABORATORY DATA:  11/05/2011 white cell count 7.1, hemoglobin 13.7, hematocrit 40.7, platelets 215.  Sodium 140, potassium 3.3, chloride 106, CO2 was 25, BUN 9, creatinine 0.59, glucose 86.  T-bili 0.4, alkaline phos is 90, AST 13, ALT 14, calcium 9.  IMPRESSION AND PLAN:  Ms. Haymaker is a 61 year old woman with history of ductal carcinoma in situ of the right breast diagnosed in September 2008.  She is status post lumpectomy on November 25, 2006, followed by external radiation therapy, completing this on February 13, 2007.  She began tamoxifen on 03/25/2007 and thus far is tolerating it well.  She will continue with annual mammograms.  Follows up in 6 months' time.    ______________________________ Laurice Record, M.D. LIO/MEDQ  D:  11/09/2011  T:  11/09/2011  Job:  [id number]

## 2011-11-09 NOTE — Progress Notes (Signed)
This office note has been dictated.

## 2011-11-09 NOTE — Telephone Encounter (Signed)
appts made and printed for pt aom °

## 2012-01-17 ENCOUNTER — Encounter (HOSPITAL_COMMUNITY): Payer: Self-pay

## 2012-01-17 ENCOUNTER — Ambulatory Visit (HOSPITAL_COMMUNITY)
Admission: RE | Admit: 2012-01-17 | Discharge: 2012-01-17 | Disposition: A | Discharge status: Home / Self Care | Payer: Medicare Other | Source: Ambulatory Visit | Attending: Obstetrics and Gynecology | Admitting: Obstetrics and Gynecology

## 2012-01-17 ENCOUNTER — Other Ambulatory Visit: Payer: Self-pay | Admitting: Obstetrics and Gynecology

## 2012-01-17 VITALS — BP 118/60 | Temp 98.4°F | Ht 68.0 in | Wt 182.2 lb

## 2012-01-17 DIAGNOSIS — Z853 Personal history of malignant neoplasm of breast: Secondary | ICD-10-CM

## 2012-01-17 DIAGNOSIS — Z1239 Encounter for other screening for malignant neoplasm of breast: Secondary | ICD-10-CM

## 2012-01-17 HISTORY — DX: Disorder of thyroid, unspecified: E07.9

## 2012-01-17 NOTE — Patient Instructions (Addendum)
Taught patient how to perform BSE and gave educational materials to take home. Patient did not need a Pap smear today due to last Pap smear was 07/15/2009. Let her know BCCCP will cover Pap smears every 3 years and that since she has a history of a hysterectomy for cervical cancer she will still be eligible. [facility] referred to [facility] for bilateral diagnostic mammogram and possible right breast ultrasound. Appointment scheduled for Tuesday, January 18, 2012 at 0800. Patient aware of appointment and will be there. Talked with patient about smoking cessation. Patient is not ready to quit at this time. Let her know about the classes offered at the Cancer Center if changes her mind or when she is ready if interested. Patient verbalized understanding.

## 2012-01-17 NOTE — Progress Notes (Signed)
Complaints of rash, burning, itchy and scaly skin on on right breast x 1 month. Last mammogram was completed at [facility] 12/25/2010  Pap Smear:    Pap smear not performed today. Per patient last Pap smear was 07/15/2009 and normal. Per patient she has a history of cervical cancer and had a hysterectomy in 1977.  Per patient she has not had any abnormal Pap smears since hysterectomy in 1977. Pap smear result above is in EPIC.  Physical exam: Breasts Breasts symmetrical. Right breast red and red lesions around breast. Right breast is dry and warm on and around nipple. Left breast has some red lesions similar to the right. Otherwise left breast skin is normal appearing. No nipple retraction left breast. Slight nipple retraction on right breast. Per patient right nipple has been slightly retracted since lumpectomy in 2008. No nipple discharge bilateral breasts. No lymphadenopathy. No lumps palpated bilateral breasts. Patient complained of tenderness when palpated right breast around nipple where skin was red, warm and dry.  Patient referred to the [facility] for bilateral diagnostic mammogram and possible right breast ultrasound. Appointment scheduled for Tuesday, January 18, 2012 at 0800.      Pelvic/Bimanual No Pap smear completed today since last Pap smear was 07/15/2009 and normal. Pap smear not indicated per BCCCP guidelines.

## 2012-01-18 ENCOUNTER — Ambulatory Visit
Admission: RE | Admit: 2012-01-18 | Discharge: 2012-01-18 | Disposition: A | Discharge status: Home / Self Care | Payer: No Typology Code available for payment source | Source: Ambulatory Visit | Attending: Obstetrics and Gynecology | Admitting: Obstetrics and Gynecology

## 2012-01-18 DIAGNOSIS — Z853 Personal history of malignant neoplasm of breast: Secondary | ICD-10-CM

## 2012-03-16 NOTE — Addendum Note (Signed)
Encounter addended by: Christine Poll Brannock, RN on: 03/16/2012  4:06 PM<BR>     Documentation filed: Visit Diagnoses, Charges VN

## 2012-03-18 ENCOUNTER — Encounter: Payer: Self-pay | Admitting: Oncology

## 2012-03-18 ENCOUNTER — Telehealth: Payer: Self-pay | Admitting: Oncology

## 2012-03-18 NOTE — Telephone Encounter (Signed)
s/w pt family member for pt to call and gv my number to call me back on Monday....mailed appt schedule and letter...for pt of Dr. Marton Redwood reassigned to Dr. Gaylyn Rong

## 2012-03-22 ENCOUNTER — Telehealth: Payer: Self-pay | Admitting: Oncology

## 2012-03-22 NOTE — Telephone Encounter (Signed)
returned pt's call phone just kept ringing....no answer

## 2012-04-27 ENCOUNTER — Encounter: Payer: Self-pay | Admitting: *Deleted

## 2012-05-08 ENCOUNTER — Other Ambulatory Visit (HOSPITAL_BASED_OUTPATIENT_CLINIC_OR_DEPARTMENT_OTHER): Payer: Medicare Other | Admitting: Lab

## 2012-05-08 DIAGNOSIS — D0510 Intraductal carcinoma in situ of unspecified breast: Secondary | ICD-10-CM

## 2012-05-08 DIAGNOSIS — D0590 Unspecified type of carcinoma in situ of unspecified breast: Secondary | ICD-10-CM

## 2012-05-08 LAB — COMPREHENSIVE METABOLIC PANEL (CC13)
CO2: 26 mEq/L (ref 22–29)
Creatinine: 0.7 mg/dL (ref 0.6–1.1)
Glucose: 100 mg/dl — ABNORMAL HIGH (ref 70–99)
Total Bilirubin: 0.26 mg/dL (ref 0.20–1.20)

## 2012-05-08 LAB — CBC WITH DIFFERENTIAL/PLATELET
Eosinophils Absolute: 0 10*3/uL (ref 0.0–0.5)
LYMPH%: 29.1 % (ref 14.0–49.7)
MCV: 88 fL (ref 79.5–101.0)
MONO%: 9.9 % (ref 0.0–14.0)
NEUT#: 3.4 10*3/uL (ref 1.5–6.5)
Platelets: 215 10*3/uL (ref 145–400)
RBC: 4.51 10*6/uL (ref 3.70–5.45)

## 2012-05-08 LAB — CANCER ANTIGEN 27.29: CA 27.29: 10 U/mL (ref 0–39)

## 2012-05-10 ENCOUNTER — Ambulatory Visit: Payer: Self-pay | Admitting: Hematology and Oncology

## 2012-05-11 ENCOUNTER — Telehealth: Payer: Self-pay | Admitting: Oncology

## 2012-05-11 ENCOUNTER — Ambulatory Visit (HOSPITAL_BASED_OUTPATIENT_CLINIC_OR_DEPARTMENT_OTHER): Payer: Medicare Other | Admitting: Oncology

## 2012-05-11 VITALS — BP 137/80 | HR 83 | Temp 97.8°F | Resp 20 | Ht 68.0 in | Wt 183.1 lb

## 2012-05-11 DIAGNOSIS — R21 Rash and other nonspecific skin eruption: Secondary | ICD-10-CM

## 2012-05-11 DIAGNOSIS — Z87898 Personal history of other specified conditions: Secondary | ICD-10-CM

## 2012-05-11 DIAGNOSIS — D0511 Intraductal carcinoma in situ of right breast: Secondary | ICD-10-CM

## 2012-05-11 NOTE — Telephone Encounter (Signed)
Gave pt appt for lab and ML, February 2015

## 2012-05-11 NOTE — Patient Instructions (Signed)
1.  Diagnosis:  History of DCIS. 2.  Treatment:  Lumpectomy, radiation, and 5 years of tamoxifen. 3.  Recommendation:  Stop Tamoxifen as you have finished 5 years.  4.  Follow up:  Yearly mammogram and visit.

## 2012-05-11 NOTE — Progress Notes (Signed)
[facility]  [phone] [phone]   OFFICE PROGRESS NOTE   Cc:  PICKARD,WARREN TOM, MD  DIAGNOSIS: right breast DCIS, ER2+/PR -.   PAST THERAPY:  S/p right lumpectomy in 2008; s/p adjuvant radiation.   CURRENT THERAPY:  Started on adjuvant Tamoxifen in 03/2007.   INTERVAL HISTORY: [name] [name] 62 y.o. female returns for regular follow up.  She used to see Dr. Dalene Carrow who is not with the practice any more.  I assumed Ms. Lill's care today.  She reported about 4 months ago, she developed right more than left breast skin rash.  Rash appeared like ring's worm with skin blisters in between.  It was very pruritic.  When it healed, it left behind scar.  Her PCP prescribed unknown ointment for presumed dusk/mite allergy.  She improved.  She examines her breasts on a routine basis but has not found anything abnormal.  She denied side effects of Tamoxifen such as night sweat, hot flash, vaginal bleeding, lower extremity swelling or pain.   The rest of the 14-point review of system was negative.   Past Medical History  Diagnosis Date  . Thyroid disease   . Cancer   . Breast cancer     Past Surgical History  Procedure Laterality Date  . Breast surgery    . Ectopic pregnancy surgery    . Abdominal hysterectomy      cervical cancer  . Total hip arthroplasty Left     Current Outpatient Prescriptions  Medication Sig Dispense Refill  . ALPRAZolam (XANAX) 0.5 MG tablet Take 0.5 mg by mouth at bedtime as needed for sleep.      . cetirizine (ZYRTEC) 10 MG tablet Take 10 mg by mouth 2 (two) times daily.      . citalopram (CELEXA) 40 MG tablet Take 40 mg by mouth daily.      . diphenhydrAMINE (BENADRYL) 25 MG tablet Take 25 mg by mouth every 6 (six) hours as needed.      . Fluticasone-Salmeterol (ADVAIR) 100-50 MCG/DOSE AEPB Inhale 1 puff into the lungs.      . mometasone (NASONEX) 50 MCG/ACT nasal spray Place into the nose.      . montelukast  (SINGULAIR) 10 MG tablet Take 10 mg by mouth at bedtime.      . ranitidine (ZANTAC) 300 MG tablet Take 300 mg by mouth at bedtime.      . simvastatin (ZOCOR) 40 MG tablet Take 40 mg by mouth every evening.      . tamoxifen (NOLVADEX) 20 MG tablet Take 1 tablet (20 mg total) by mouth daily.  90 tablet  3  . traMADol-acetaminophen (ULTRACET) 37.5-325 MG per tablet Take 1 tablet by mouth every 6 (six) hours as needed.       No current facility-administered medications for this visit.    ALLERGIES:  is allergic to ibuprofen; naproxen sodium; aspirin; hydrocodeine; and vicodin.  REVIEW OF SYSTEMS:  The rest of the 14-point review of system was negative.   Filed Vitals:   05/11/12 1005  BP: 137/80  Pulse: 83  Temp: 97.8 F (36.6 C)  Resp: 20   Wt Readings from Last 3 Encounters:  05/11/12 183 lb 1.6 oz (83.054 kg)  03/31/12 186 lb (84.369 kg)  01/17/12 182 lb 3.2 oz (82.645 kg)   ECOG Performance status:   PHYSICAL EXAMINATION:  General:  well-nourished woman, in no acute distress.  Eyes:  no scleral icterus.  ENT:  There were no oropharyngeal  lesions.  Neck was without thyromegaly.  Lymphatics:  Negative cervical, supraclavicular or axillary adenopathy.  Respiratory: lungs were clear bilaterally without wheezing or crackles.  Cardiovascular:  Regular rate and rhythm, S1/S2, without murmur, rub or gallop.  There was no pedal edema.  GI:  abdomen was soft, flat, nontender, nondistended, without organomegaly.  Muscoloskeletal:  no spinal tenderness of palpation of vertebral spine.  Skin exam was without echymosis, petichae.  Neuro exam was nonfocal.  Patient was able to get on and off exam table without assistance.  Gait was normal.  Patient was alerted and oriented.  Attention was good.   Language was appropriate.  Mood was normal without depression.  Speech was not pressured.  Thought content was not tangential.  Breast exam in the presence of nursing staff Ms. Cameo Hale Bogus showed  hyperpigmented, hyperkeratotic lesions in both breasts (right more than left).  There no was palpable breast mass, thickening, erythema, or pain on palpation.    LABORATORY/RADIOLOGY DATA:  Lab Results  Component Value Date   WBC 5.6 05/08/2012   HGB 13.7 05/08/2012   HCT 39.7 05/08/2012   PLT 215 05/08/2012   GLUCOSE 100* 05/08/2012   CHOL 169 06/03/2010   TRIG 110 06/03/2010   HDL 49 06/03/2010   LDLCALC 98 06/03/2010   ALKPHOS 110 05/08/2012   ALT 23 05/08/2012   AST 21 05/08/2012   NA 141 05/08/2012   K 3.6 05/08/2012   CL 105 05/08/2012   CREATININE 0.7 05/08/2012   BUN 11.2 05/08/2012   CO2 26 05/08/2012   INR 0.94 10/06/2010   HGBA1C 5.7 12/05/2009    ASSESSMENT AND PLAN:   1.  History of DCIS: she is in remission.  Last mammogram in 01/2012 was normal.  She has finished 5 years of Tamoxifen.  I dicussed with her that for invasive breast cancer, there are data supporting 10 years of Tamoxifen over 5 years.  However, there is no data for DCIS to go over more than 5 years.  In addition, her ER was only 2%.  Therefore, I recommended stopping Tamoxifen and go on observation.  Ms. Whiting expressed informed understanding and agreed to go on observation.   2.  Skin rash:  Unclear etiology.  I have low suspicion for breast cancer as mammogram at the time when she had the skin rash was negative.  I advised her to seek referral to dermatology if skin rash recurs.   3.  Follow up:  In about 1 year.      The length of time of the face-to-face encounter was 25 minutes. More than 50% of time was spent counseling and coordination of care.

## 2012-05-15 ENCOUNTER — Telehealth: Payer: Self-pay | Admitting: Oncology

## 2012-05-15 NOTE — Telephone Encounter (Signed)
lvm for pt regarding April, May, July, and Feb 2015 appt....mailed pt all of these mths appt schedule

## 2012-05-17 ENCOUNTER — Telehealth: Payer: Self-pay | Admitting: *Deleted

## 2012-05-17 ENCOUNTER — Other Ambulatory Visit: Payer: Self-pay | Admitting: Oncology

## 2012-05-17 NOTE — Telephone Encounter (Signed)
Pt is questioning why she has labs scheduled every 6 weeks?  She thought Dr. Gaylyn Rong released her to f/u in one year.

## 2012-05-17 NOTE — Telephone Encounter (Signed)
Please apologize to patient on my behalf.  It was my scheduling snaffu.  Thanks.

## 2012-05-17 NOTE — Telephone Encounter (Signed)
Called pt and apologized for scheduling mix up.   Informed she does not need these labs and to just keep appt scheduled for next year in Feb 2015.  Pt verbalized understanding.

## 2012-05-18 ENCOUNTER — Telehealth: Payer: Self-pay | Admitting: Oncology

## 2012-05-18 NOTE — Telephone Encounter (Signed)
per 3.5.14 pof cancel labs every 6weeks x 3 pt aware

## 2012-06-22 ENCOUNTER — Other Ambulatory Visit: Payer: Medicare Other

## 2012-08-03 ENCOUNTER — Other Ambulatory Visit: Payer: Medicare Other

## 2012-09-14 ENCOUNTER — Other Ambulatory Visit: Payer: Medicare Other

## 2012-11-10 ENCOUNTER — Encounter (HOSPITAL_COMMUNITY): Payer: Self-pay | Admitting: *Deleted

## 2012-11-10 ENCOUNTER — Emergency Department (INDEPENDENT_AMBULATORY_CARE_PROVIDER_SITE_OTHER)
Admission: EM | Admit: 2012-11-10 | Discharge: 2012-11-10 | Disposition: A | Discharge status: Home / Self Care | Payer: Medicare Other | Source: Home / Self Care

## 2012-11-10 DIAGNOSIS — N390 Urinary tract infection, site not specified: Secondary | ICD-10-CM

## 2012-11-10 DIAGNOSIS — M549 Dorsalgia, unspecified: Secondary | ICD-10-CM

## 2012-11-10 DIAGNOSIS — R109 Unspecified abdominal pain: Secondary | ICD-10-CM

## 2012-11-10 DIAGNOSIS — E876 Hypokalemia: Secondary | ICD-10-CM

## 2012-11-10 LAB — CBC WITH DIFFERENTIAL/PLATELET
Basophils Relative: 0 % (ref 0–1)
Eosinophils Absolute: 0 10*3/uL (ref 0.0–0.7)
Eosinophils Relative: 0 % (ref 0–5)
Hemoglobin: 13.4 g/dL (ref 12.0–15.0)
Lymphs Abs: 1 10*3/uL (ref 0.7–4.0)
MCH: 30.5 pg (ref 26.0–34.0)
MCHC: 35 g/dL (ref 30.0–36.0)
MCV: 87.2 fL (ref 78.0–100.0)
Monocytes Relative: 9 % (ref 3–12)
Platelets: 242 10*3/uL (ref 150–400)
RBC: 4.39 MIL/uL (ref 3.87–5.11)

## 2012-11-10 LAB — POCT I-STAT, CHEM 8
Creatinine, Ser: 0.9 mg/dL (ref 0.50–1.10)
Glucose, Bld: 124 mg/dL — ABNORMAL HIGH (ref 70–99)
HCT: 42 % (ref 36.0–46.0)
Hemoglobin: 14.3 g/dL (ref 12.0–15.0)
TCO2: 25 mmol/L (ref 0–100)

## 2012-11-10 LAB — POCT URINALYSIS DIP (DEVICE)
Bilirubin Urine: NEGATIVE
Glucose, UA: NEGATIVE mg/dL
Ketones, ur: NEGATIVE mg/dL
Specific Gravity, Urine: >=1.03 (ref 1.005–1.030)

## 2012-11-10 MED ORDER — CEFTRIAXONE SODIUM 1 G IJ SOLR
INTRAMUSCULAR | Status: AC
  Filled 2012-11-10: qty 10

## 2012-11-10 MED ORDER — LIDOCAINE HCL (PF) 1 % IJ SOLN
INTRAMUSCULAR | Status: AC
  Filled 2012-11-10: qty 5

## 2012-11-10 MED ORDER — CEFTRIAXONE SODIUM 1 G IJ SOLR
1.0000 g | Freq: Once | INTRAMUSCULAR | Status: AC
  Administered 2012-11-10: 1 g via INTRAMUSCULAR

## 2012-11-10 MED ORDER — MORPHINE SULFATE 2 MG/ML IJ SOLN
INTRAMUSCULAR | Status: AC
  Filled 2012-11-10: qty 1

## 2012-11-10 MED ORDER — POTASSIUM CHLORIDE CRYS ER 20 MEQ PO TBCR
EXTENDED_RELEASE_TABLET | ORAL | Status: AC
Start: 2012-11-10 — End: 2012-11-10
  Filled 2012-11-10: qty 2

## 2012-11-10 MED ORDER — ONDANSETRON 4 MG PO TBDP
4.0000 mg | ORAL_TABLET | Freq: Once | ORAL | Status: AC
  Administered 2012-11-10: 4 mg via ORAL

## 2012-11-10 MED ORDER — ONDANSETRON 4 MG PO TBDP
ORAL_TABLET | ORAL | Status: AC
  Filled 2012-11-10: qty 1

## 2012-11-10 MED ORDER — CEPHALEXIN 500 MG PO CAPS: 500.0000 mg | ORAL_CAPSULE | Freq: Four times a day (QID) | ORAL | Status: DC

## 2012-11-10 MED ORDER — OXYCODONE-ACETAMINOPHEN 5-325 MG PO TABS: ORAL_TABLET | ORAL | Status: DC

## 2012-11-10 MED ORDER — MORPHINE SULFATE 2 MG/ML IJ SOLN
2.0000 mg | Freq: Once | INTRAMUSCULAR | Status: AC
  Administered 2012-11-10: 2 mg via INTRAMUSCULAR

## 2012-11-10 MED ORDER — POTASSIUM CHLORIDE CRYS ER 20 MEQ PO TBCR
40.0000 meq | EXTENDED_RELEASE_TABLET | Freq: Once | ORAL | Status: AC
  Administered 2012-11-10: 40 meq via ORAL

## 2012-11-10 MED ORDER — POTASSIUM CHLORIDE CRYS ER 20 MEQ PO TBCR: EXTENDED_RELEASE_TABLET | ORAL | Status: DC

## 2012-11-10 NOTE — ED Notes (Signed)
C/o low back pain and cramping in her lower abdomen onset 0300 and got to BR and had a loose BM.  Hurts a little to urinate.  Voiding frequently in small amounts.  No known back injury.  No hematuria.  No hx. of kidney stones.  States she hyperventilated because of the pain and vomited dark brown and light grey liquid x 1.  Sinuses are draining.

## 2012-11-10 NOTE — ED Notes (Signed)
Verbal order from Hayden Rasmussen for a blood draw of CBC & I Stat chem *

## 2012-11-10 NOTE — ED Provider Notes (Addendum)
CSN: [id number]     Arrival date & time 11/10/12  1707 History   None    Chief Complaint  Patient presents with  . Back Pain   (Consider location/radiation/quality/duration/timing/severity/associated sxs/prior Treatment) HPI Comments: 62 year old female presents with the following course of events. At 0300 hours this morning she awoke with moderate to severe cramping across the lower abdomen and pain across the low back. She had an urgency to have a bowel movement and she had a small loose stool. The pain in her lowermost mid abdomen persists as does her low back pain. A couple of hours later in the morning she was "hyperventilating" and vomited once. She took a "back pain medicine". There was transient and modest relief. At 9:30 AM she was trying to perform light housework and the pain across her low back increased. She describes it as a burning and pressure sensation. It is unaffected by movement or palpation. She complains of dysuria, urinary frequency, urgency and a slow interrupted stream. Denies fever at home but feels cold. Denies chest pain or shortness of breath. She is asking for pain medication primarily for her low abdominal suprapubic pain.   Past Medical History  Diagnosis Date  . Thyroid disease     Grave disease  . Cancer     cervix  . Breast cancer    Past Surgical History  Procedure Laterality Date  . Breast surgery    . Ectopic pregnancy surgery    . Abdominal hysterectomy      cervical cancer  . Total hip arthroplasty Left    Family History  Problem Relation Age of Onset  . Cancer Mother     lung  . Cancer Sister   . Diabetes Maternal Grandmother   . Dementia Father    History  Substance Use Topics  . Smoking status: Current Every Day Smoker -- 1.50 packs/day for 40 years  . Smokeless tobacco: Not on file  . Alcohol Use: Yes     Comment: 1/month   OB History   Grav Para Term Preterm Abortions TAB SAB Ect Mult Living   4 3 3  1  1   3      Review of  Systems  Constitutional: Positive for activity change and appetite change. Negative for fever.  HENT: Negative.   Respiratory: Negative.   Cardiovascular: Negative for chest pain, palpitations and leg swelling.  Gastrointestinal: Positive for abdominal pain and diarrhea.  Genitourinary: Positive for dysuria, urgency and frequency. Negative for flank pain and vaginal bleeding.       As per history of present illness. History of hysterectomy.  Skin: Positive for pallor.  Neurological: Negative.     Allergies  Ibuprofen; Naproxen sodium; Aspirin; Hydrocodeine; and Vicodin  Home Medications   Current Outpatient Rx  Name  Route  Sig  Dispense  Refill  . diphenhydrAMINE (BENADRYL) 25 MG tablet   Oral   Take 25 mg by mouth every 6 (six) hours as needed.         . ALPRAZolam (XANAX) 0.5 MG tablet   Oral   Take 0.5 mg by mouth at bedtime as needed for sleep.         . cephALEXin (KEFLEX) 500 MG capsule   Oral   Take 1 capsule (500 mg total) by mouth 4 (four) times daily.   28 capsule   0   . cetirizine (ZYRTEC) 10 MG tablet   Oral   Take 10 mg by mouth 2 (two) times daily.         [name]  citalopram (CELEXA) 40 MG tablet   Oral   Take 40 mg by mouth daily.         . Fluticasone-Salmeterol (ADVAIR) 100-50 MCG/DOSE AEPB   Inhalation   Inhale 1 puff into the lungs.         . mometasone (NASONEX) 50 MCG/ACT nasal spray   Nasal   Place into the nose.         . montelukast (SINGULAIR) 10 MG tablet   Oral   Take 10 mg by mouth at bedtime.         [name] oxyCODONE-acetaminophen (PERCOCET/ROXICET) 5-325 MG per tablet      1/2 to 1 tab q 4-6h prn pain   10 tablet   0   . potassium chloride SA (K-DUR,KLOR-CON) 20 MEQ tablet      1 tab daily for 3 days   3 tablet   0   . ranitidine (ZANTAC) 300 MG tablet   Oral   Take 300 mg by mouth at bedtime.         . simvastatin (ZOCOR) 40 MG tablet   Oral   Take 40 mg by mouth every evening.         . tamoxifen  (NOLVADEX) 20 MG tablet   Oral   Take 1 tablet (20 mg total) by mouth daily.   90 tablet   3     Gave pt original rx at visit today 04/21/11.   . traMADol-acetaminophen (ULTRACET) 37.5-325 MG per tablet   Oral   Take 1 tablet by mouth every 6 (six) hours as needed.          BP 189/77  Pulse 85  Temp(Src) 100.1 F (37.8 C) (Oral)  Resp 20  SpO2 98% Physical Exam  Nursing note and vitals reviewed. Constitutional: She is oriented to person, place, and time. She appears well-developed and well-nourished. No distress.  Eyes: Conjunctivae and EOM are normal.  Neck: Normal range of motion. Neck supple.  Cardiovascular: Normal rate, regular rhythm and normal heart sounds.   Pulmonary/Chest: Effort normal. No respiratory distress. She has wheezes.  Mild scattered wheezes bilaterally.  Abdominal: Soft. Bowel sounds are normal. She exhibits no distension and no mass. There is tenderness. There is no rebound and no guarding.  Mild abdominal tenderness in the low mid abdomen and over the suprapubis.  Musculoskeletal: She exhibits no edema and no tenderness.  No CVAT  Lymphadenopathy:    She has no cervical adenopathy.  Neurological: She is alert and oriented to person, place, and time. She exhibits normal muscle tone.  Skin: Skin is warm and dry. No rash noted. No erythema.  Psychiatric: She has a normal mood and affect.    ED Course  Procedures (including critical care time) Labs Review Labs Reviewed  CBC WITH DIFFERENTIAL - Abnormal; Notable for the following:    WBC 13.9 (*)    Neutrophils Relative % 84 (*)    Neutro Abs 11.7 (*)    Lymphocytes Relative 7 (*)    Monocytes Absolute 1.2 (*)    All other components within normal limits  POCT URINALYSIS DIP (DEVICE) - Abnormal; Notable for the following:    Hgb urine dipstick MODERATE (*)    Protein, ur 100 (*)    Leukocytes, UA SMALL (*)    All other components within normal limits  POCT I-STAT, CHEM 8 - Abnormal; Notable for  the following:    Potassium 2.8 (*)    Glucose, Bld 124 (*)  Calcium, Ion 1.11 (*)    All other components within normal limits  URINE CULTURE   Imaging Review No results found.  MDM   1. UTI (lower urinary tract infection)   2. Back pain   3. Lower abdominal pain, unspecified laterality    After her morphine 4 mg IM was administered her pain had improved and down to 3/10. Most likely diagnosis is acute UTI , possible early pyelonephritis or ureteral calculus. She will be administered Rocephin 1000 mg IM and start Keflex 500 mg 4 times a day tomorrow. Percocet 5 mg 1/2-1 tablet every 4-6 hours when necessary pain Administer potassium 40 mEq by mouth tonight and start tomorrow with 20 mEq daily for 3 days. She does have an elevated white count of 15,000. She does not have peritoneal signs. Although her history is consistent for UTI  The provider cannot quite explain the severity of the lower suprapubic and the lower most mid abdominal pain, it just seems out of proportion for her diagnoses. She is instructed if she develops fever, vomiting, inability to maintain liquids or medications by mouth, increased pain or no improvement in the next 24-48 hours she is to go directly to the emergency department promptly. Otherwise followup with her PCP on Monday. If she is not able to follow up with her PCP on Monday she may return.  1840 hours:  Rosalita Chessman, RN reports that approximately 15 minutes after administering the oral potassium the patient vomited. The emesis was inspected and there were no pill fragments. The patient will be administer  Zofran 4 mg by mouth. The expectation is that the potassium caused the vomiting. Again, the patient is to go to the emergency department should she continue with vomiting tomorrow.   Hayden Rasmussen, NP 11/10/12 2040  Hayden Rasmussen, NP 11/12/12 [phone]

## 2012-11-10 NOTE — ED Notes (Signed)
Pt. vomited 300 cc clear liquid.  No pill fragments noted.  NP notified and verbal order for Zofran given.  Pt. Thinks it was the potassium making her sick.

## 2012-11-10 NOTE — ED Notes (Signed)
Given ice water.

## 2012-11-11 NOTE — ED Provider Notes (Signed)
Medical screening examination/treatment/procedure(s) were performed by a resident physician or non-physician practitioner and as the supervising physician I was immediately available for consultation/collaboration.  Darlette Dubow, MD   Karisha Marlin S Luken Shadowens, MD 11/11/12 0805 

## 2012-11-12 LAB — URINE CULTURE: Special Requests: NORMAL

## 2012-11-14 NOTE — ED Provider Notes (Signed)
Medical screening examination/treatment/procedure(s) were performed by a resident physician or non-physician practitioner and as the supervising physician I was immediately available for consultation/collaboration.  Clementeen Graham, MD   Rodolph Bong, MD 11/14/12 [phone]

## 2012-11-16 ENCOUNTER — Telehealth (HOSPITAL_COMMUNITY): Payer: Self-pay | Admitting: *Deleted

## 2012-11-16 NOTE — ED Notes (Addendum)
Urine culture: Mult. bacterial types- none predominant- suggest recollection if clinically indicated. Discussed with Hayden Rasmussen NP.  I told him I was going to call for clinical improvement.  I called pt. and left a message to call. [name] [name] 11/16/2012 Pt. called back.  Pt. verified x 2 and given results.  Pt. said she is feeling a little better.  She was able to keep down all her potassium and antibiotics at home.  She said she still feels a sensation when she urinates ( not pain), and her back pain is improved.  States she feels it at night but not as bad as when she was here. I suggested she call her PCP and schedule a recheck for next week to check her potassium and urine if still having symptoms. Pt. said she would.

## 2013-01-16 ENCOUNTER — Other Ambulatory Visit: Payer: Self-pay | Admitting: Family Medicine

## 2013-04-27 ENCOUNTER — Telehealth: Payer: Self-pay | Admitting: Hematology and Oncology

## 2013-04-27 NOTE — Telephone Encounter (Signed)
called pt to confirm next appt in office. former [place] pt moved from [facility] 2/27 to NG 2/26. per pt time for lb/NG 2/26 changed to 12pm.

## 2013-05-10 ENCOUNTER — Other Ambulatory Visit: Payer: Self-pay | Admitting: Hematology and Oncology

## 2013-05-10 ENCOUNTER — Ambulatory Visit: Payer: Medicare Other | Admitting: Hematology and Oncology

## 2013-05-10 ENCOUNTER — Telehealth: Payer: Self-pay | Admitting: Hematology and Oncology

## 2013-05-10 ENCOUNTER — Other Ambulatory Visit: Payer: Medicare Other

## 2013-05-10 NOTE — Telephone Encounter (Signed)
lvm for pt regarding to cx lab today.[name] KitchenMarland KitchenMarland [name]

## 2013-05-10 NOTE — Telephone Encounter (Signed)
pt called to r/s est appt....done....pt aware of new d.t

## 2013-05-11 ENCOUNTER — Ambulatory Visit: Payer: Medicare Other | Admitting: Oncology

## 2013-05-11 ENCOUNTER — Other Ambulatory Visit: Payer: Medicare Other

## 2013-05-25 ENCOUNTER — Telehealth: Payer: Self-pay | Admitting: Hematology and Oncology

## 2013-05-25 ENCOUNTER — Ambulatory Visit (HOSPITAL_BASED_OUTPATIENT_CLINIC_OR_DEPARTMENT_OTHER): Payer: Medicare Other | Admitting: Hematology and Oncology

## 2013-05-25 ENCOUNTER — Encounter: Payer: Self-pay | Admitting: Hematology and Oncology

## 2013-05-25 VITALS — BP 130/54 | HR 92 | Temp 98.0°F | Resp 18 | Ht 68.0 in | Wt 178.6 lb

## 2013-05-25 DIAGNOSIS — D0590 Unspecified type of carcinoma in situ of unspecified breast: Secondary | ICD-10-CM

## 2013-05-25 DIAGNOSIS — D0510 Intraductal carcinoma in situ of unspecified breast: Secondary | ICD-10-CM

## 2013-05-25 DIAGNOSIS — E0500 Thyrotoxicosis with diffuse goiter without thyrotoxic crisis or storm: Secondary | ICD-10-CM

## 2013-05-25 DIAGNOSIS — F17200 Nicotine dependence, unspecified, uncomplicated: Secondary | ICD-10-CM

## 2013-05-25 NOTE — Progress Notes (Signed)
[facility] OFFICE PROGRESS NOTE  Patient Care Team: Susy Frizzle, MD as PCP - General (Family Medicine) Heath Lark, MD as Consulting Physician (Hematology and Oncology)  DIAGNOSIS: Right breast DCIS status post lumpectomy, radiation therapy and adjuvant endocrine therapy  SUMMARY OF ONCOLOGIC HISTORY: This is a pleasant patient was diagnosed with DCIS after her mammogram. The patient had lumpectomy followed by radiation therapy followed by 5 years of tamoxifen. She was placed on tamoxifen from January 2009 to January 2014. She is currently on observation.  INTERVAL HISTORY: [name] [name] 63 y.o. female returns for further followup. She denies any recent abnormal breast examination, palpable mass, abnormal breast appearance or nipple changes She missed a mammogram last year. She complained of double vision due to a relapse of Graves' disease. This is being managed by her endocrinologist.  I have reviewed the past medical history, past surgical history, social history and family history with the patient and they are unchanged from previous note.  ALLERGIES:  is allergic to ibuprofen; naproxen sodium; aspirin; hydrocodeine; and vicodin.  MEDICATIONS:  Current Outpatient Prescriptions  Medication Sig Dispense Refill  . diphenhydrAMINE (BENADRYL) 25 MG tablet Take 25 mg by mouth every 6 (six) hours as needed.      . mometasone (NASONEX) 50 MCG/ACT nasal spray Place into the nose.      . oxyCODONE-acetaminophen (PERCOCET/ROXICET) 5-325 MG per tablet 1/2 to 1 tab q 4-6h prn pain  10 tablet  0  . ranitidine (ZANTAC) 300 MG tablet Take 300 mg by mouth at bedtime.       No current facility-administered medications for this visit.    REVIEW OF SYSTEMS:   Constitutional: Denies fevers, chills or abnormal weight loss Ears, nose, mouth, throat, and face: Denies mucositis or sore throat Respiratory: Denies cough, dyspnea or wheezes Cardiovascular: Denies palpitation,  chest discomfort or lower extremity swelling Gastrointestinal:  Denies nausea, heartburn or change in bowel habits Skin: Denies abnormal skin rashes Lymphatics: Denies new lymphadenopathy or easy bruising Neurological:Denies numbness, tingling or new weaknesses Behavioral/Psych: Mood is stable, no new changes  All other systems were reviewed with the patient and are negative.  PHYSICAL EXAMINATION: ECOG PERFORMANCE STATUS: 0 - Asymptomatic  Filed Vitals:   05/25/13 1159  BP: 130/54  Pulse: 92  Temp: 98 F (36.7 C)  Resp: 18   Filed Weights   05/25/13 1159  Weight: 178 lb 9.6 oz (81.012 kg)    GENERAL:alert, no distress and comfortable SKIN: skin color, texture, turgor are normal, no rashes or significant lesions EYES: Noted protuberance of her eyes due to Graves' disease.  OROPHARYNX:no exudate, no erythema and lips, buccal mucosa, and tongue normal  NECK: supple, thyroid normal size, non-tender, without nodularity LYMPH:  no palpable lymphadenopathy in the cervical, axillary or inguinal LUNGS: clear to auscultation and percussion with normal breathing effort HEART: regular rate & rhythm and no murmurs and no lower extremity edema ABDOMEN:abdomen soft, non-tender and normal bowel sounds Musculoskeletal:no cyanosis of digits and no clubbing  NEURO: alert & oriented x 3 with fluent speech, no focal motor/sensory deficits Bilateral breast examination were performed. Well-healed lumpectomy scar with no abnormalities. LABORATORY DATA:  I have reviewed the data as listed    Component Value Date/Time   NA 140 11/10/2012 1840   NA 141 05/08/2012 1402   K 2.8* 11/10/2012 1840   K 3.6 05/08/2012 1402   CL 103 11/10/2012 1840   CL 105 05/08/2012 1402   CO2 26 05/08/2012 1402  CO2 25 11/05/2011 1322   GLUCOSE 124* 11/10/2012 1840   GLUCOSE 100* 05/08/2012 1402   BUN 10 11/10/2012 1840   BUN 11.2 05/08/2012 1402   CREATININE 0.90 11/10/2012 1840   CREATININE 0.7 05/08/2012 1402   CALCIUM  9.3 05/08/2012 1402   CALCIUM 9.0 11/05/2011 1322   PROT 7.0 05/08/2012 1402   PROT 6.7 11/05/2011 1322   ALBUMIN 3.5 05/08/2012 1402   ALBUMIN 4.0 11/05/2011 1322   AST 21 05/08/2012 1402   AST 13 11/05/2011 1322   ALT 23 05/08/2012 1402   ALT 14 11/05/2011 1322   ALKPHOS 110 05/08/2012 1402   ALKPHOS 90 11/05/2011 1322   BILITOT 0.26 05/08/2012 1402   BILITOT 0.4 11/05/2011 1322   GFRNONAA >60 10/06/2010 1407   GFRAA >60 10/06/2010 1407    No results found for this basename: SPEP, UPEP,  kappa and lambda light chains    Lab Results  Component Value Date   WBC 13.9* 11/10/2012   NEUTROABS 11.7* 11/10/2012   HGB 14.3 11/10/2012   HCT 42.0 11/10/2012   MCV 87.2 11/10/2012   PLT 242 11/10/2012      Chemistry      Component Value Date/Time   NA 140 11/10/2012 1840   NA 141 05/08/2012 1402   K 2.8* 11/10/2012 1840   K 3.6 05/08/2012 1402   CL 103 11/10/2012 1840   CL 105 05/08/2012 1402   CO2 26 05/08/2012 1402   CO2 25 11/05/2011 1322   BUN 10 11/10/2012 1840   BUN 11.2 05/08/2012 1402   CREATININE 0.90 11/10/2012 1840   CREATININE 0.7 05/08/2012 1402      Component Value Date/Time   CALCIUM 9.3 05/08/2012 1402   CALCIUM 9.0 11/05/2011 1322   ALKPHOS 110 05/08/2012 1402   ALKPHOS 90 11/05/2011 1322   AST 21 05/08/2012 1402   AST 13 11/05/2011 1322   ALT 23 05/08/2012 1402   ALT 14 11/05/2011 1322   BILITOT 0.26 05/08/2012 1402   BILITOT 0.4 11/05/2011 1322     SSESSMENT & PLAN:  #1 DCIS I recommend the patient to reschedule the mammogram. I will continue to see her on a yearly basis with history, physical examination only. She will continue mammogram on a yearly basis #2 tobacco abuse Spent some time educating the patient importance of nicotine cessation but she is not interested to quit. She would think about it. #3 Graves' disease I would defer to her endocrinologist for management.   All questions were answered. The patient knows to call the clinic with any problems, questions or concerns.  No barriers to learning was detected. I spent 15 minutes counseling the patient face to face. The total time spent in the appointment was 20 minutes and more than 50% was on counseling and review of test results     [facility], Juliane Guest, MD 05/25/2013 12:34 PM

## 2013-05-25 NOTE — Telephone Encounter (Signed)
Gave pt appt MD for April 2015

## 2013-10-25 ENCOUNTER — Ambulatory Visit: Payer: Self-pay | Admitting: Family Medicine

## 2013-10-29 ENCOUNTER — Encounter: Payer: Self-pay | Admitting: Family Medicine

## 2013-10-29 ENCOUNTER — Ambulatory Visit (INDEPENDENT_AMBULATORY_CARE_PROVIDER_SITE_OTHER): Payer: Medicare Other | Admitting: Family Medicine

## 2013-10-29 VITALS — BP 128/76 | HR 78 | Temp 97.9°F | Resp 16 | Ht 67.0 in | Wt 185.0 lb

## 2013-10-29 DIAGNOSIS — F3289 Other specified depressive episodes: Secondary | ICD-10-CM

## 2013-10-29 DIAGNOSIS — D0590 Unspecified type of carcinoma in situ of unspecified breast: Secondary | ICD-10-CM

## 2013-10-29 DIAGNOSIS — K2970 Gastritis, unspecified, without bleeding: Secondary | ICD-10-CM

## 2013-10-29 DIAGNOSIS — D0511 Intraductal carcinoma in situ of right breast: Secondary | ICD-10-CM

## 2013-10-29 DIAGNOSIS — F411 Generalized anxiety disorder: Secondary | ICD-10-CM

## 2013-10-29 DIAGNOSIS — E018 Other iodine-deficiency related thyroid disorders and allied conditions: Secondary | ICD-10-CM

## 2013-10-29 DIAGNOSIS — F329 Major depressive disorder, single episode, unspecified: Secondary | ICD-10-CM

## 2013-10-29 DIAGNOSIS — K2990 Gastroduodenitis, unspecified, without bleeding: Secondary | ICD-10-CM

## 2013-10-29 DIAGNOSIS — E785 Hyperlipidemia, unspecified: Secondary | ICD-10-CM

## 2013-10-29 MED ORDER — ALPRAZOLAM 0.5 MG PO TABS: 0.5000 mg | ORAL_TABLET | Freq: Two times a day (BID) | ORAL | Status: DC | PRN

## 2013-10-29 MED ORDER — CITALOPRAM HYDROBROMIDE 10 MG PO TABS: 10.0000 mg | ORAL_TABLET | Freq: Every day | ORAL | Status: DC

## 2013-10-29 NOTE — Patient Instructions (Addendum)
Release of records- [address]  Release of records- [facility] - EYE CLINIC- Dr. Glade Nurse Start the dexliant for your stomach - I will also check for H pylori bacteria that can cause ulcers We will call with lab results Restart Celexa 10mg  once a day  Xanax will be short term  Mammogram to be set up - [facility] on [address]  F/U 3 weeks -- PHYSICAL

## 2013-10-30 ENCOUNTER — Encounter: Payer: Self-pay | Admitting: Family Medicine

## 2013-10-30 LAB — CBC WITH DIFFERENTIAL/PLATELET
BASOS ABS: 0 10*3/uL (ref 0.0–0.1)
Basophils Relative: 0 % (ref 0–1)
EOS ABS: 0 10*3/uL (ref 0.0–0.7)
EOS PCT: 0 % (ref 0–5)
HEMATOCRIT: 42 % (ref 36.0–46.0)
Hemoglobin: 13.9 g/dL (ref 12.0–15.0)
Lymphocytes Relative: 28 % (ref 12–46)
Lymphs Abs: 2 10*3/uL (ref 0.7–4.0)
MCH: 29.1 pg (ref 26.0–34.0)
MCHC: 33.1 g/dL (ref 30.0–36.0)
MCV: 88.1 fL (ref 78.0–100.0)
MONO ABS: 0.7 10*3/uL (ref 0.1–1.0)
Monocytes Relative: 10 % (ref 3–12)
Neutro Abs: 4.3 10*3/uL (ref 1.7–7.7)
Neutrophils Relative %: 62 % (ref 43–77)
PLATELETS: 289 10*3/uL (ref 150–400)
RBC: 4.77 MIL/uL (ref 3.87–5.11)
RDW: 13.5 % (ref 11.5–15.5)
WBC: 7 10*3/uL (ref 4.0–10.5)

## 2013-10-30 LAB — LIPID PANEL
CHOL/HDL RATIO: 5.5 ratio
CHOLESTEROL: 216 mg/dL — AB (ref 0–200)
HDL: 39 mg/dL — AB (ref >39)
LDL Cholesterol: 114 mg/dL — ABNORMAL HIGH (ref 0–99)
Triglycerides: 314 mg/dL — ABNORMAL HIGH (ref <150)
VLDL: 63 mg/dL — AB (ref 0–40)

## 2013-10-30 LAB — TSH: TSH: 0.471 u[IU]/mL (ref 0.350–4.500)

## 2013-10-30 LAB — COMPREHENSIVE METABOLIC PANEL
ALT: 12 U/L (ref 0–35)
AST: 15 U/L (ref 0–37)
Albumin: 4.5 g/dL (ref 3.5–5.2)
Alkaline Phosphatase: 96 U/L (ref 39–117)
BILIRUBIN TOTAL: 0.3 mg/dL (ref 0.2–1.2)
BUN: 10 mg/dL (ref 6–23)
CO2: 26 mEq/L (ref 19–32)
CREATININE: 0.44 mg/dL — AB (ref 0.50–1.10)
Calcium: 9.3 mg/dL (ref 8.4–10.5)
Chloride: 104 mEq/L (ref 96–112)
Glucose, Bld: 90 mg/dL (ref 70–99)
Potassium: 4 mEq/L (ref 3.5–5.3)
Sodium: 139 mEq/L (ref 135–145)
Total Protein: 7.1 g/dL (ref 6.0–8.3)

## 2013-10-30 LAB — T3, FREE: T3 FREE: 3.6 pg/mL (ref 2.3–4.2)

## 2013-10-30 LAB — H. PYLORI ANTIBODY, IGG: H Pylori IgG: 5.53 {ISR} — ABNORMAL HIGH

## 2013-10-30 LAB — T4, FREE: Free T4: 1.17 ng/dL (ref 0.80–1.80)

## 2013-10-30 NOTE — Assessment & Plan Note (Signed)
Restart low-dose Xanax 0.5 mg twice a day in addition to Celexa 10 mg followup 3 weeks

## 2013-10-30 NOTE — Assessment & Plan Note (Signed)
I will recheck her thyroid levels and get her restarted on Synthroid. She'll continue following up with ophthalmology regarding her eye component to the Graves' disease

## 2013-10-30 NOTE — Assessment & Plan Note (Signed)
Mammogram to be scheduled

## 2013-10-30 NOTE — Assessment & Plan Note (Signed)
Will start her on Celexa 10 mg

## 2013-10-30 NOTE — Assessment & Plan Note (Signed)
Will check lipid panel. 

## 2013-10-30 NOTE — Progress Notes (Signed)
Patient ID: [name] [name], female   DOB: Nov 28, 1950, 63 y.o.   MRN: [id number]   Subjective:    Patient ID: [name] [name], female    DOB: [birth year], 63 y.o.   MRN: [id number]  Patient presents for Reflux, Graves Disease, Thyroid check and Stress level  patient here to followup chronic medical problems she has not been seen for about a year and a half. She does have a complex medical history she has a history of ductal carcinoma in situ in the right breast status post treatment she is overdue for her mammogram, she is history of Graves' disease she was treated with radioactive iodine and is supposed to be on Synthroid it looks like the she's not been on this medication for greater than a year she's been having difficulties with her eyes she bulging of the eyes especially the right eye which is associated with redness and drainage. She is followed by an ophthalmologist locally [facility]) as well as one at [place]. She states that her Graves' disease is particularly just affected her eyes she's not had much change in her weight no cold or heat intolerance.  She also has chronic history recurrent depression and anxiety she was last on Celexa and Xanax which she did well with at that time her father was sick with dementia she tapered herself off of the medications about a year ago but now is having more stress is due to her husband's disability recently. She is helping care for her grandchildren as well and feels very overwhelmed and stressed out. She will like to go back on her depression medication. She denies any suicidal ideations.  Epigastric burning sensation for the past 2 weeks she states she's used home with her chart noted Zantac she's also used Pepcid. She's been taking the Pepcid as well as Pepto-Bismol does help sees her stomach but she continues to have the pain. She also has chronic issues with her bowels which we will discuss some of her other problems a later visit she  is also overdue for a complete physical and Pap smear.      Review Of Systems:  GEN- denies fatigue, fever, weight loss,weakness, recent illness HEENT- +eye drainage, denies change in vision, nasal discharge, CVS- denies chest pain, palpitations RESP- denies SOB, cough, wheeze ABD- denies N/V, +change in stools, +abd pain GU- denies dysuria, hematuria, dribbling, incontinence MSK- denies joint pain, muscle aches, injury Neuro- denies headache, dizziness, syncope, seizure activity       Objective:    BP 128/76  Pulse 78  Temp(Src) 97.9 F (36.6 C) (Oral)  Resp 16  Ht 5\' 7"  (1.702 m)  Wt 185 lb (83.915 kg)  BMI 28.97 kg/m2 GEN- NAD, alert and oriented x3 HEENT- PERRL, EOMI, + right  injected sclera, bulging bilat eyes R>l  With clear drainage, pink conjunctiva, MMM, oropharynx clear Neck- Supple, no thyromegaly, CVS- RRR, no murmur RESP-CTAB ABD-NABS,soft, mild ttp epigastric region ,ND EXT- No edema Psych- normal affect and mood, no SI, well groomed, normal speech Pulses- Radial 2+        Assessment & Plan:      Problem List Items Addressed This Visit   HYPOTHYROIDISM, POST-RADIOACTIVE IODINE - Primary   Relevant Orders      TSH (Completed)      T3, free (Completed)      T4, free (Completed)   HYPERLIPIDEMIA   Relevant Orders      Lipid panel (Completed)   DEPRESSION  Relevant Medications      citalopram (CELEXA) tablet      ALPRAZolam (XANAX) tablet   DCIS (ductal carcinoma in situ)   Relevant Orders      MM Digital Diagnostic Bilat    Other Visit Diagnoses   Gastritis        Relevant Orders       CBC with Differential (Completed)       Comprehensive metabolic panel (Completed)       H. pylori antibody, IgG (Completed)       Note: This dictation was prepared with Dragon dictation along with smaller phrase technology. Any transcriptional errors that result from this process are unintentional.

## 2013-11-01 ENCOUNTER — Ambulatory Visit: Payer: Self-pay | Admitting: Family Medicine

## 2013-11-02 MED ORDER — AMOXICILLIN 500 MG PO CAPS: 1000.0000 mg | ORAL_CAPSULE | Freq: Two times a day (BID) | ORAL | Status: DC

## 2013-11-02 MED ORDER — CLARITHROMYCIN 500 MG PO TABS
500.0000 mg | ORAL_TABLET | Freq: Two times a day (BID) | ORAL | Status: DC
Start: 2013-11-02 — End: 2013-11-22

## 2013-11-02 NOTE — Addendum Note (Signed)
Addended by: Vic Blackbird F on: 11/02/2013 09:10 AM   Modules accepted: Orders

## 2013-11-06 ENCOUNTER — Telehealth: Payer: Self-pay | Admitting: *Deleted

## 2013-11-06 ENCOUNTER — Ambulatory Visit
Admission: RE | Admit: 2013-11-06 | Discharge: 2013-11-06 | Disposition: A | Discharge status: Home / Self Care | Payer: Medicare Other | Source: Ambulatory Visit | Attending: Family Medicine | Admitting: Family Medicine

## 2013-11-06 DIAGNOSIS — D0511 Intraductal carcinoma in situ of right breast: Secondary | ICD-10-CM

## 2013-11-06 NOTE — Telephone Encounter (Signed)
Pt called stating the the biaxin is too expensive and wants to know if she can get something cheaper, she says she has the amoxicillin but provider also ordered Biaxin 500mg  to take with it, pharmacist told her she could take the zpak but had to get order from provider first. MD please advise.      Pharmacy [facility].

## 2013-11-06 NOTE — Telephone Encounter (Signed)
zpack will not cover helicobacter pylori.  Really needs biaxin.

## 2013-11-06 NOTE — Telephone Encounter (Signed)
Call placed to patient and patient husband, Chrissie Noa made aware.

## 2013-11-07 ENCOUNTER — Telehealth: Payer: Self-pay | Admitting: *Deleted

## 2013-11-07 NOTE — Telephone Encounter (Signed)
Pt called stating has been up all night with eye pain with a lot of pressure on right eyeball and has headache, wants to know if we can prescribe her something for pain into she gets into endocrinologist, Please advise.

## 2013-11-07 NOTE — Telephone Encounter (Signed)
If she is having pain and pressure in her eye, she needs to be evaluated immediately, may even need to be evaluated at ophthalmology.

## 2013-11-08 ENCOUNTER — Encounter: Payer: Self-pay | Admitting: Physician Assistant

## 2013-11-08 ENCOUNTER — Ambulatory Visit (INDEPENDENT_AMBULATORY_CARE_PROVIDER_SITE_OTHER): Payer: Medicare Other | Admitting: Physician Assistant

## 2013-11-08 VITALS — BP 136/74 | HR 68 | Temp 98.2°F | Resp 14 | Ht 67.0 in | Wt 185.0 lb

## 2013-11-08 DIAGNOSIS — H5710 Ocular pain, unspecified eye: Secondary | ICD-10-CM

## 2013-11-08 DIAGNOSIS — H5789 Other specified disorders of eye and adnexa: Secondary | ICD-10-CM

## 2013-11-08 DIAGNOSIS — H5711 Ocular pain, right eye: Secondary | ICD-10-CM

## 2013-11-08 NOTE — Telephone Encounter (Signed)
Appointment scheduled.

## 2013-11-09 NOTE — Progress Notes (Signed)
Patient ID: LOYD SALVADOR MRN: [id number], DOB: 22-Nov-1950, 63 y.o. Date of Encounter: 11/09/2013, 7:25 AM    Chief Complaint:  Chief Complaint  Patient presents with  . Eye Pain    x 3 weeks- states that hse has Graves Disease and it is causing eye to buldge out and she has pressure and headache behind eye- sclera noted extremely red- has been referred to thyroid specialist- can't been seen until mid Sept     HPI: 63 y.o. year old female here with the above complaints.   THE FOLLOWING IS COPIED FROM DR. Dorian Heckle OV NOTE 10/29/2013: she is history of Graves' disease she was treated with radioactive iodine and is supposed to be on Synthroid it looks like the she's not been on this medication for greater than a year she's been having difficulties with her eyes she bulging of the eyes especially the right eye which is associated with redness and drainage. She is followed by an ophthalmologist locally [facility]) as well as one at [place]. She states that her Graves' disease is particularly just affected her eyes she's not had much change in her weight no cold or heat intolerance.  Assessment and plan Dr.Metamora's note included that she would recheck thyroid levels and get patient restarted on Synthroid. Plan that she will continue to followup with ophthalmology regarding her ophthalmic component of Graves' disease. As well she scheduled referral to endocrinology. Pt says this appt is 11/22/2013.  Patient says that she came in today because her right eye has been red and painful and bulging more than usual. As well she says that she feels pressure behind the eye.   When I asked her if she had contacted either one of her eye doctors, she says no and says that she " just saw them". When asked when she last saw the eye doctor at [place] she says about 7 months ago. When asked  when she last saw Dr. Katy Fitch, she says--I saw him before I saw the doctor at [facility]--so > 7  months ago.       Home Meds:   Outpatient Prescriptions Prior to Visit  Medication Sig Dispense Refill  . ALPRAZolam (XANAX) 0.5 MG tablet Take 1 tablet (0.5 mg total) by mouth 2 (two) times daily as needed for anxiety.  45 tablet  1  . amoxicillin (AMOXIL) 500 MG capsule Take 2 capsules (1,000 mg total) by mouth 2 (two) times daily. For 10 days  40 capsule  0  . citalopram (CELEXA) 10 MG tablet Take 1 tablet (10 mg total) by mouth daily.  30 tablet  2  . clarithromycin (BIAXIN) 500 MG tablet Take 1 tablet (500 mg total) by mouth 2 (two) times daily. For 10 days  20 tablet  0  . dexlansoprazole (DEXILANT) 60 MG capsule Take 60 mg by mouth daily.       No facility-administered medications prior to visit.    Allergies:  Allergies  Allergen Reactions  . Ibuprofen Hives  . Naproxen Sodium Hives  . Aspirin Hives  . Hydrocodeine [Dihydrocodeine]   . Vicodin [Hydrocodone-Acetaminophen]       Review of Systems: See HPI for pertinent ROS. All other ROS negative.    Physical Exam: Blood pressure 136/74, pulse 68, temperature 98.2 F (36.8 C), temperature source Oral, resp. rate 14, height 5\' 7"  (1.702 m), weight 185 lb (83.915 kg)., Body mass index is 28.97 kg/(m^2). General:  Overweight WF.Appears in no acute  distress. HEENT: Right Eye is bulging out furhter than the left eye. The right eye is bloody red.  The left eye has no redness. The left eye is bulging slightly, but not as far as the right one.  She is sitting in exam room with lights off and wearing dark glasses.  When I open the door and the light from the hallway goes in the room, she says that light is hurting her eye.  Neck: Supple. No thyromegaly. No lymphadenopathy. Lungs: Clear bilaterally to auscultation without wheezes, rales, or rhonchi. Breathing is unlabored. Heart: Regular rhythm. No murmurs, rubs, or gallops. Msk:  Strength and tone normal for age. Extremities/Skin: Warm and dry. Neuro: Alert and oriented X  3. Moves all extremities spontaneously. Gait is normal. CNII-XII grossly in tact. Psych:  Responds to questions appropriately with a normal affect.     ASSESSMENT AND PLAN:  63 y.o. year old female with  1. Redness of right eye  2. Eye pain, right  I explained to her that she needs to see an ophthalmologist. Explained that there is nothing I can do here. We called to Dr. Patrici Ranks office and informed them that she is here with bloody red, bulging, painful right eye. It is now 4:45 PM. We talked to Dr. Zenia Resides office while the patient was still here in our office. They have said that they will see her tomorrow morning at 7:45 AM. Patient informed and patient voices agreement to followup at their office tomorrow morning at 7:45 AM.   Signed, Karis Juba, PA, [facility] 11/09/2013 7:25 AM

## 2013-11-14 ENCOUNTER — Telehealth: Payer: Self-pay | Admitting: Family Medicine

## 2013-11-14 NOTE — Telephone Encounter (Signed)
Returned call to patient.   Reports that she has been seen by optometrist and he recommended surgery. She has been referred to surgeon and she is supposed to be seeing the surgeon on 11/15/2013.  States that she has endocrinology appointment scheduled for 11/22/2013.  Requested advise on if she should reschedule endocrinology appointment. Advised to go to surgeon on 11/15/2013 and to see what he recommends.

## 2013-11-14 NOTE — Telephone Encounter (Signed)
[phone]  Pt is needing to speak to Dr Buelah Manis, she is going to meet with a surgeon tomorrow and wants to speak to her regarding that. She was seen last week by Olean Ree and she sent her to dr Katy Fitch and he is now sending her to the surgeon.

## 2013-11-22 ENCOUNTER — Ambulatory Visit (INDEPENDENT_AMBULATORY_CARE_PROVIDER_SITE_OTHER): Payer: Medicare Other | Admitting: Endocrinology

## 2013-11-22 ENCOUNTER — Encounter: Payer: Self-pay | Admitting: Endocrinology

## 2013-11-22 VITALS — BP 152/72 | HR 74 | Temp 98.3°F | Resp 16 | Ht 66.0 in | Wt 186.2 lb

## 2013-11-22 DIAGNOSIS — E0500 Thyrotoxicosis with diffuse goiter without thyrotoxic crisis or storm: Secondary | ICD-10-CM

## 2013-11-22 LAB — T3, FREE: T3 FREE: 3.5 pg/mL (ref 2.3–4.2)

## 2013-11-22 LAB — TSH: TSH: 0.52 u[IU]/mL (ref 0.35–4.50)

## 2013-11-22 NOTE — Progress Notes (Signed)
Patient ID: [name] [name], female   DOB: 1950-05-04, 62 y.o.   MRN: [id number]   Reason for Appointment:  [name] [name]' disease, new visit    History of Present Illness:   The hyperthyroidism  was first diagnosed in 2004 At that time she had very typical symptoms of weight loss, heat intolerance, diarrhea, shakiness and nervousness Details of her initial evaluation are not available but She was treated by her PCP with I-131, 14 mCi She thinks her thyroid level got low so after this and she decided on thyroid supplementation  Not clear what doses of thyroid supplement she had been taking subsequently but in 2011 appears that her TSH levels were running low She was only on 25 mcg at that time and this was stopped Subsequently her thyroid levels have been normal and she has not had any thyroid supplements Also has not been told to have recurrence of her hyperthyroidism Currently she feels well except for periodic loose stools  Her main problem is related to her right eye prominence and blurred vision as well as double vision that started about a year and a half ago She also has irritation of the eyes and unable to close her eye well. Also tends to get a stye on the upper eyelid She was referred by her ophthalmologist to the neuro- ophthalmologist at [facility] Not clear if she has had a CT scan but was told that she had Graves' disease and she has been given a prism lens to relieve her diplopia She has been told to start a course of IV steroids and she is here for exploring other options for treatment          The thyroid levels have been as follows:  Lab Results  Component Value Date   FREET4 1.17 10/29/2013   FREET4 0.98 06/03/2010   FREET4 0.93 02/25/2010   TSH 0.471 10/29/2013   TSH 3.053 06/03/2010   TSH 1.854 02/25/2010      Past Medical History  Diagnosis Date  . Thyroid disease     Grave disease  . COPD (chronic obstructive pulmonary disease)     PFT 2011  . Cancer    cervix  . 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 Depression   . Anxiety   . Arthritis     Past Surgical History  Procedure Laterality Date  . Breast surgery    . Ectopic pregnancy surgery    . Abdominal hysterectomy      cervical cancer  . Total hip arthroplasty Left     Family History  Problem Relation Age of Onset  . Cancer Mother     lung  . Cancer Sister     ?lymphoma  . Diabetes Maternal Grandmother   . Dementia Father     Social History:  reports that she has been smoking.  She has never used smokeless tobacco. She reports that she drinks alcohol. She reports that she does not use illicit drugs.  Allergies:  Allergies  Allergen Reactions  . Ibuprofen Hives  . Naproxen Sodium Hives  . Aspirin Hives  . Hydrocodeine [Dihydrocodeine]   . Vicodin [Hydrocodone-Acetaminophen]       Medication List       This list is accurate as of: 11/22/13 11:34 AM.  Always use your most recent med list.               ALPRAZolam 0.5 MG tablet  Commonly known as:  Duanne Moron  Take  1 tablet (0.5 mg total) by mouth 2 (two) times daily as needed for anxiety.     amoxicillin 500 MG capsule  Commonly known as:  AMOXIL  Take 2 capsules (1,000 mg total) by mouth 2 (two) times daily. For 10 days     citalopram 10 MG tablet  Commonly known as:  CELEXA  Take 1 tablet (10 mg total) by mouth daily.     clarithromycin 500 MG tablet  Commonly known as:  BIAXIN  Take 1 tablet (500 mg total) by mouth 2 (two) times daily. For 10 days     DEXILANT 60 MG capsule  Generic drug:  dexlansoprazole  Take 60 mg by mouth daily.        Review of Systems:  CARDIOLOGY: no history of high blood pressure.            GASTROENTEROLOGY:  has had periodic diarrhea  Has history of reflux     ENDOCRINOLOGY:  no history of Diabetes.     She feels stressed and has a history of depression and anxiety, currently taking Celexa        Examination:    BP 152/72  Pulse 74  Temp(Src) 98.3 F (36.8 C)   Resp 16  Ht 5\' 6"  (1.676 m)  Wt 186 lb 3.2 oz (84.46 kg)  BMI 30.07 kg/m2  SpO2 98%   General Appearance: pleasant, somewhat anxious, large build. No cushingoid features      Eyes: She has marked proptosis on the right side with significant lid retraction, both upper and lower. Has mild erythema of the conjunctiva but no chemosis. Minimal lid retraction on the left  Minimal proptosis on the left, exophthalmometry measurements 25 on the right and 22 on the left She has restricted lateral movement on the right side with diplopia when looking to the right, the movements are nearly normal She has swelling of the eyelids especially on the right side with marked swelling of the lower eyelid        Neck: The thyroid is not palpable There is no lymphadenopathy .    Cardiovascular: Normal  heart sounds, no murmur Respiratory:  Lungs clear Gastrointestinal: Abdominal exam not indicated       Neurological: REFLEXES: at biceps are brisk. No tremor    Skin:   warm, no rash or lesions Extremities: no edema or skin thickening of the legs    Assessments:    GRAVES disease: She appears to have marked ocular changes on the right side typical of Graves' disease with significant proptosis, lid retraction and eyelid swelling and symptoms of diplopia. 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 However her last TSH is low normal along with high normal free T3 level. Does not appear symptomatic and has no objective tachycardia but is anxious today  Plan:  Recheck thyroid levels since even if she had mild hyperthyroidism this may influence her eye changes and may need antithyroid drugs She will follow treatment protocol at the  ophthalmologist and agreed that she should try the pulse steroid therapy She can also try empirical selenium OTC which has shown some benefit in studies although results may be slow  Followup in 3 months   [name] [name] 11/22/2013, 11:34  AM

## 2013-11-22 NOTE — Patient Instructions (Signed)
Selenium tab twice daily 200mg 

## 2013-11-24 NOTE — Progress Notes (Signed)
Quick Note:  Please let patient know that the lab result is stable and no further action needed ______

## 2013-11-28 ENCOUNTER — Encounter: Payer: Self-pay | Admitting: Family Medicine

## 2013-11-28 ENCOUNTER — Ambulatory Visit (INDEPENDENT_AMBULATORY_CARE_PROVIDER_SITE_OTHER): Payer: Medicare Other | Admitting: Family Medicine

## 2013-11-28 VITALS — BP 128/76 | HR 70 | Temp 98.1°F | Resp 14 | Ht 66.0 in | Wt 186.0 lb

## 2013-11-28 DIAGNOSIS — Z0000 Encounter for general adult medical examination without abnormal findings: Secondary | ICD-10-CM

## 2013-11-28 DIAGNOSIS — F17200 Nicotine dependence, unspecified, uncomplicated: Secondary | ICD-10-CM

## 2013-11-28 DIAGNOSIS — E0500 Thyrotoxicosis with diffuse goiter without thyrotoxic crisis or storm: Secondary | ICD-10-CM

## 2013-11-28 DIAGNOSIS — F411 Generalized anxiety disorder: Secondary | ICD-10-CM

## 2013-11-28 DIAGNOSIS — Z8541 Personal history of malignant neoplasm of cervix uteri: Secondary | ICD-10-CM

## 2013-11-28 DIAGNOSIS — N760 Acute vaginitis: Secondary | ICD-10-CM

## 2013-11-28 DIAGNOSIS — F329 Major depressive disorder, single episode, unspecified: Secondary | ICD-10-CM

## 2013-11-28 DIAGNOSIS — Z1272 Encounter for screening for malignant neoplasm of vagina: Secondary | ICD-10-CM

## 2013-11-28 DIAGNOSIS — F3289 Other specified depressive episodes: Secondary | ICD-10-CM

## 2013-11-28 DIAGNOSIS — Z23 Encounter for immunization: Secondary | ICD-10-CM

## 2013-11-28 LAB — WET PREP FOR TRICH, YEAST, CLUE
CLUE CELLS WET PREP: NONE SEEN
Trich, Wet Prep: NONE SEEN

## 2013-11-28 MED ORDER — OXYCODONE-ACETAMINOPHEN 5-325 MG PO TABS: 1.0000 | ORAL_TABLET | Freq: Three times a day (TID) | ORAL | Status: DC | PRN

## 2013-11-28 MED ORDER — ALPRAZOLAM 0.5 MG PO TABS: 0.5000 mg | ORAL_TABLET | Freq: Two times a day (BID) | ORAL | Status: DC | PRN

## 2013-11-28 MED ORDER — CITALOPRAM HYDROBROMIDE 20 MG PO TABS: 20.0000 mg | ORAL_TABLET | Freq: Every day | ORAL | Status: DC

## 2013-11-28 NOTE — Assessment & Plan Note (Signed)
Continue to encourage cessation she wants to quit cold [place]

## 2013-11-28 NOTE — Progress Notes (Signed)
Patient ID: [name] [name], female   DOB: June 03, 1950, 63 y.o.   MRN: [id number]   Subjective:    Patient ID: [name] [name], female    DOB: May 27, 1950, 63 y.o.   MRN: [id number]  Patient presents for CPE with PAP and Smoking Cessation  Subjective:   Patient presents for Medicare Annual/Subsequent preventive examination.   patient here for Medicare breast and Pap smear CPE . She does have history of remote cervical cancer status post hysterectomy. She also has history of for carcinoma in situ in a recent mammogram which was clear. I reviewed her fasting labs with her which were remarkable for elevated triglycerides and LDL. She was seen by endocrinology regarding her [name]' disease her thyroid is currently euthyroid therefore no medications or intervention are needed she is still being followed by ophthalmology secondary to the Graves affecting her eyes specifically her right eye. They have referred her to me for pain management but they do plan to start a type of steroid infusion to help her eye pain as well as the bulging.  From her last visit she was started on Celexa and Xanax for her mood she states it is helping some but it is very stressful and her home with her husband she thinks the medication may need to be increased. She's also tried to quit smoking because she was told she cannot have the surgery that she needs for her eyes because of her tobacco use. She currently is smoking half a pack per day but  was up to 3-1/2 packs per day  Admits to vaginal discharge past few days, mild itching  Review Of Systems:  GEN- denies fatigue, fever, weight loss,weakness, recent illness HEENT- denies eye drainage, change in vision, nasal discharge, CVS- denies chest pain, palpitations RESP- denies SOB, cough, wheeze ABD- denies N/V, change in stools, abd pain GU- denies dysuria, hematuria, dribbling, incontinence MSK- denies joint pain, muscle aches, injury Neuro- denies headache, dizziness,  syncope, seizure activity Review Past Medical/Family/Social: Per EMR   Risk Factors  Current exercise habits: None Dietary issues discussed: Yes  Cardiac risk factors: Obesity (BMI >= 30 kg/m2).   Depression Screen  (Note: if answer to either of the following is "Yes", a more complete depression screening is indicated)  Over the past two weeks, have you felt down, depressed or hopeless? Yes Over the past two weeks, have you felt little interest or pleasure in doing things?Yes Have you lost interest or pleasure in daily life? No Do you often feel hopeless? No Do you cry easily over simple problems? Yes   Activities of Daily Living  In your present state of health, do you have any difficulty performing the following activities?:  Driving? No  Managing money? No  Feeding yourself? No  Getting from bed to chair? No  Climbing a flight of stairs? No  Preparing food and eating?: No  Bathing or showering? No  Getting dressed: No  Getting to the toilet? No  Using the toilet:No  Moving around from place to place: No  In the past year have you fallen or had a near fall?:No  Are you sexually active? No  Do you have more than one partner? No   Hearing Difficulties: No  Do you often ask people to speak up or repeat themselves? No  Do you experience ringing or noises in your ears? No Do you have difficulty understanding soft or whispered voices? No  Do you feel that you have a problem with memory? No  Do you often misplace items? No  Do you feel safe at home? Yes  Cognitive Testing  Alert? Yes Normal Appearance?Yes  Oriented to person? Yes Place? Yes  Time? Yes  Recall of three objects? Yes  Can perform simple calculations? Yes  Displays appropriate judgment?Yes  Can read the correct time from a watch face?Yes   List the Names of Other Physician/Practitioners you currently use: Dr. Dwyane Dee Opthalmology  Screening Tests / Date Colonoscopy                     Zostavax -unable to  afford Mammogram UTD Influenza Vaccine UTD Tetanus/tdap                 Objective:    BP 128/76  Pulse 70  Temp(Src) 98.1 F (36.7 C) (Oral)  Resp 14  Ht 5\' 6"  (1.676 m)  Wt 186 lb (84.369 kg)  BMI 30.04 kg/m2 GEN- NAD, alert and oriented x3 HEENT- PERRL, EOMI, + right  injected sclera, bulging bilat eyes R>l  With clear drainage, pink conjunctiva, MMM, oropharynx clea Breast- normal symmetry, no nipple inversion,no nipple drainage, no nodules or lumps felt Nodes- no axillary nodes CVS-RRR, no murmur,  RESP-CTAB GU- normal external genitalia, vaginal mucosa pink and moist, + white  discharge, urethra normal position. ovaries not palpated, no cervix or uterus,  Rectum- normal tone FOBT negative Ext- no edema Psych- normal affect and mood, normal speech          Assessment & Plan:   During the course of the visit the patient was educated and counseled about appropriate screening and preventive services including:  Colonoscopy Diet review for nutrition referral? Yes ____ Not Indicated __x__  Patient Instructions (the written plan) was given to the patient.  Medicare Attestation  I have personally reviewed:  The patient's medical and social history  Their use of alcohol, tobacco or illicit drugs  Their current medications and supplements  The patient's functional ability including ADLs,fall risks, home safety risks, cognitive, and hearing and visual impairment  Diet and physical activities  Evidence for depression or mood disorders  The patient's weight, height, BMI, and visual acuity have been recorded in the chart. I have made referrals, counseling, and provided education to the patient based on review of the above and I have provided the patient with a written personalized care plan for preventive services.    Problem List Items Addressed This Visit   None    Visit Diagnoses   Screening for vaginal cancer    -  Primary    Relevant Orders       PAP,  ThinPrep ASCUS Rflx HPV Rflx Type    Vaginitis and vulvovaginitis        Wet prep done, yeast found, treat with diflucan    Relevant Orders       WET PREP FOR [place], YEAST, CLUE (Completed)    History of cervical cancer        Relevant Orders       PAP, ThinPrep ASCUS Rflx HPV Rflx Type    Need for prophylactic vaccination and inoculation against influenza        Routine general medical examination at a health care facility        PAP Done, Referral for colonoscopy,  Flu shot given       Note: This dictation was prepared with Dragon dictation along with smaller phrase technology. Any transcriptional errors that result from this process are unintentional.

## 2013-11-28 NOTE — Assessment & Plan Note (Signed)
Increase Celexa to 20 mg continue Xanax at current dose

## 2013-11-28 NOTE — Assessment & Plan Note (Signed)
I've given her a short course of Percocet to use for her eye pain on the figure out when she can have surgical intervention and start the infusions that she needs

## 2013-11-28 NOTE — Patient Instructions (Signed)
Colonoscopy to be scheduled Celexa increased to 20mg  Continue xanax Congratulations on the smoking We will call with PAP Smear results Keep working on low fat diet Flu shot given F/U 3 Months

## 2013-11-29 LAB — PAP THINPREP ASCUS RFLX HPV RFLX TYPE

## 2013-11-30 ENCOUNTER — Other Ambulatory Visit: Payer: Self-pay | Admitting: *Deleted

## 2013-11-30 MED ORDER — FLUCONAZOLE 150 MG PO TABS: ORAL_TABLET | ORAL | Status: DC

## 2013-12-10 ENCOUNTER — Telehealth: Payer: Self-pay | Admitting: *Deleted

## 2013-12-10 NOTE — Telephone Encounter (Signed)
Refill on oxyCODONE-acetaminophen (ROXICET) 5-325 MG per tablet

## 2013-12-11 MED ORDER — OXYCODONE-ACETAMINOPHEN 5-325 MG PO TABS: 1.0000 | ORAL_TABLET | Freq: Three times a day (TID) | ORAL | Status: DC | PRN

## 2013-12-11 NOTE — Telephone Encounter (Signed)
Left message on vm script ready for pickup

## 2013-12-11 NOTE — Telephone Encounter (Signed)
I will give her 1 more script, I do not recommend she stay on these pain medications because of her eyes, she was suppose to start some type of infusion to help with the pain by her eye doctors

## 2013-12-25 ENCOUNTER — Telehealth: Payer: Self-pay | Admitting: *Deleted

## 2013-12-25 NOTE — Telephone Encounter (Signed)
Pt called needing refill on oxyCODONE-acetaminophen (ROXICET) 5-325 MG per tablet, Last written date was 12/10/13.

## 2013-12-25 NOTE — Telephone Encounter (Signed)
Okay to refill this last time: Please let pt know no further refills on percocet, I do not think she needs to stay on this for her eye pain, if the eyes are still that painful she needs to call her eye doctor I dont think she received this message last time

## 2013-12-26 MED ORDER — OXYCODONE-ACETAMINOPHEN 5-325 MG PO TABS: 1.0000 | ORAL_TABLET | Freq: Three times a day (TID) | ORAL | Status: DC | PRN

## 2013-12-26 NOTE — Telephone Encounter (Signed)
LMTRC

## 2013-12-26 NOTE — Telephone Encounter (Signed)
Pt is aware of refill request and states that she has eye appointment on Thursday. Pt was also informed that this the last refill.

## 2014-01-14 ENCOUNTER — Encounter: Payer: Self-pay | Admitting: Family Medicine

## 2014-01-16 ENCOUNTER — Ambulatory Visit (INDEPENDENT_AMBULATORY_CARE_PROVIDER_SITE_OTHER): Payer: Medicare Other | Admitting: Family Medicine

## 2014-01-16 ENCOUNTER — Encounter: Payer: Self-pay | Admitting: Family Medicine

## 2014-01-16 VITALS — BP 122/68 | HR 72 | Temp 97.9°F | Resp 14 | Ht 67.0 in | Wt 187.0 lb

## 2014-01-16 DIAGNOSIS — R768 Other specified abnormal immunological findings in serum: Secondary | ICD-10-CM | POA: Insufficient documentation

## 2014-01-16 DIAGNOSIS — K219 Gastro-esophageal reflux disease without esophagitis: Secondary | ICD-10-CM

## 2014-01-16 DIAGNOSIS — F411 Generalized anxiety disorder: Secondary | ICD-10-CM

## 2014-01-16 DIAGNOSIS — R894 Abnormal immunological findings in specimens from other organs, systems and tissues: Secondary | ICD-10-CM

## 2014-01-16 DIAGNOSIS — F331 Major depressive disorder, recurrent, moderate: Secondary | ICD-10-CM

## 2014-01-16 DIAGNOSIS — E0500 Thyrotoxicosis with diffuse goiter without thyrotoxic crisis or storm: Secondary | ICD-10-CM

## 2014-01-16 DIAGNOSIS — D6862 Lupus anticoagulant syndrome: Secondary | ICD-10-CM | POA: Insufficient documentation

## 2014-01-16 DIAGNOSIS — Z1211 Encounter for screening for malignant neoplasm of colon: Secondary | ICD-10-CM

## 2014-01-16 DIAGNOSIS — K529 Noninfective gastroenteritis and colitis, unspecified: Secondary | ICD-10-CM

## 2014-01-16 LAB — CBC WITH DIFFERENTIAL/PLATELET
BASOS ABS: 0 10*3/uL (ref 0.0–0.1)
Basophils Relative: 0 % (ref 0–1)
Eosinophils Absolute: 0 10*3/uL (ref 0.0–0.7)
Eosinophils Relative: 0 % (ref 0–5)
HCT: 39.1 % (ref 36.0–46.0)
HEMOGLOBIN: 13.4 g/dL (ref 12.0–15.0)
LYMPHS PCT: 26 % (ref 12–46)
Lymphs Abs: 1.7 10*3/uL (ref 0.7–4.0)
MCH: 29.5 pg (ref 26.0–34.0)
MCHC: 34.3 g/dL (ref 30.0–36.0)
MCV: 86.1 fL (ref 78.0–100.0)
Monocytes Absolute: 0.7 10*3/uL (ref 0.1–1.0)
Monocytes Relative: 10 % (ref 3–12)
NEUTROS PCT: 64 % (ref 43–77)
Neutro Abs: 4.2 10*3/uL (ref 1.7–7.7)
PLATELETS: 235 10*3/uL (ref 150–400)
RBC: 4.54 MIL/uL (ref 3.87–5.11)
RDW: 14.5 % (ref 11.5–15.5)
WBC: 6.6 10*3/uL (ref 4.0–10.5)

## 2014-01-16 LAB — COMPREHENSIVE METABOLIC PANEL
ALBUMIN: 3.9 g/dL (ref 3.5–5.2)
ALT: 11 U/L (ref 0–35)
AST: 14 U/L (ref 0–37)
Alkaline Phosphatase: 102 U/L (ref 39–117)
BUN: 11 mg/dL (ref 6–23)
CHLORIDE: 105 meq/L (ref 96–112)
CO2: 28 mEq/L (ref 19–32)
Calcium: 9.1 mg/dL (ref 8.4–10.5)
Creat: 0.55 mg/dL (ref 0.50–1.10)
Glucose, Bld: 89 mg/dL (ref 70–99)
POTASSIUM: 4 meq/L (ref 3.5–5.3)
SODIUM: 142 meq/L (ref 135–145)
Total Bilirubin: 0.4 mg/dL (ref 0.2–1.2)
Total Protein: 6.8 g/dL (ref 6.0–8.3)

## 2014-01-16 MED ORDER — DEXLANSOPRAZOLE 60 MG PO CPDR: 60.0000 mg | DELAYED_RELEASE_CAPSULE | Freq: Every day | ORAL | Status: DC

## 2014-01-16 MED ORDER — ALPRAZOLAM 0.5 MG PO TABS: 0.5000 mg | ORAL_TABLET | Freq: Two times a day (BID) | ORAL | Status: DC | PRN

## 2014-01-16 MED ORDER — OXYCODONE-ACETAMINOPHEN 5-325 MG PO TABS: 1.0000 | ORAL_TABLET | Freq: Three times a day (TID) | ORAL | Status: DC | PRN

## 2014-01-16 NOTE — Assessment & Plan Note (Signed)
Continue celexa and xanax

## 2014-01-16 NOTE — Progress Notes (Signed)
Patient ID: [name] [name], female   DOB: [birth year], 63 y.o.   MRN: [id number]   Subjective:    Patient ID: [name] [name], female    DOB: 17-Oct-1950, 63 y.o.   MRN: [id number]  Patient presents for Eye Surgery and Stomach illness patient here most due to pain with her right eye. She has known history of Graves' disease with Route of conjunctivitis she's being followed by [facility]. She is supposed to have surgery however and this still has not been set up me she has been complaining of severe eye pain for the past few months. I did give her a couple prescriptions for Percocet and advised her that this needs to be taken care of by her ophthalmologist and that I would not keep her on chronic doses of Percocet to control her eye pain.  He states she was also seen by ear nose and throat may tell her that she may have a fungating mass in her sinuses. When I asked her about treatments for her eyes she then states that they told she had hepatitis A and that she could not have any steroids.  Does complain of burning sensation in her epigastric region she was treated for H. Pylori a couple months ago however she is still not taking the PPI she is still not seeing gastroenterology regarding her chronic diarrhea and need for colonoscopy   Review Of Systems:  GEN- denies fatigue, fever, weight loss,weakness, recent illness HEENT- + eye drainage, change in vision, nasal discharge, CVS- denies chest pain, palpitations RESP- denies SOB, cough, wheeze ABD- denies N/V, change in stools, +abd pain GU- denies dysuria, hematuria, dribbling, incontinence MSK- denies joint pain, muscle aches, injury Neuro- denies headache, dizziness, syncope, seizure activity       Objective:    BP 122/68 mmHg  Pulse 72  Temp(Src) 97.9 F (36.6 C) (Oral)  Resp 14  Ht 5\' 7"  (1.702 m)  Wt 187 lb (84.823 kg)  BMI 29.28 kg/m2 GEN- NAD, alert and oriented x3 HEENT- PERRL, EOMI, + right  injected sclera, bulging bilat  eyes R>l  With clear drainage, pink conjunctiva, MMM, oropharynx clea CVS-RRR, no murmur,  RESP-CTAB ABD-NABS,soft, NT,ND Ext- no edema Psych-upset discussing eye pain, not depressed appearing, normal speech       Assessment & Plan:      Problem List Items Addressed This Visit    MDD (major depressive disorder)   Relevant Medications      ALPRAZolam (XANAX) tablet   Hepatitis A antibody positive   Relevant Orders      CBC with Differential      Comprehensive metabolic panel   Graves' eye disease   GERD (gastroesophageal reflux disease)   Relevant Medications      dexlansoprazole (DEXILANT) 60 MG capsule   GAD (generalized anxiety disorder)   Chronic diarrhea - Primary   Relevant Orders      CBC with Differential      Comprehensive metabolic panel   Anxiety state   Relevant Medications      ALPRAZolam Duanne Moron) tablet      Note: This dictation was prepared with Dragon dictation along with smaller phrase technology. Any transcriptional errors that result from this process are unintentional.

## 2014-01-16 NOTE — Assessment & Plan Note (Signed)
Restart Dexilant she was given samples from the office for the next 14 days

## 2014-01-16 NOTE — Assessment & Plan Note (Signed)
Regarding the hepatitis and a seems that she may been exposed to this in the past she did not have any positive IgM antibodies but had positive IgG Her liver function tests were also normal I did repeat this today

## 2014-01-16 NOTE — Patient Instructions (Addendum)
Take the dexilant once a day  Pain medication  Referral to GI for your abdominal pain and chronic diarrhea We will call with lab results F/U 3 months

## 2014-01-16 NOTE — Assessment & Plan Note (Signed)
Referral back to GI for colonoscoy and evaluation

## 2014-01-16 NOTE — Assessment & Plan Note (Signed)
I discussed with her in detail that she will need to follow with her eye doctor for further pain management she's been that for the past few months for her significant eye pain and I do not understand why there is been no intervention if she is having such severe pain out of her right eye. We gave her a prescription for Percocet 20 tablets at the end of the visit she did call her eye doctor and they state that they're going to start mailing her prescriptions for pain seems that she may also be set up for surgery in December.

## 2014-01-21 ENCOUNTER — Ambulatory Visit (INDEPENDENT_AMBULATORY_CARE_PROVIDER_SITE_OTHER): Payer: Medicare Other | Admitting: Physician Assistant

## 2014-01-21 ENCOUNTER — Encounter: Payer: Self-pay | Admitting: Physician Assistant

## 2014-01-21 VITALS — BP 160/70 | HR 98 | Temp 98.0°F | Resp 22

## 2014-01-21 DIAGNOSIS — F17200 Nicotine dependence, unspecified, uncomplicated: Secondary | ICD-10-CM

## 2014-01-21 DIAGNOSIS — J209 Acute bronchitis, unspecified: Secondary | ICD-10-CM

## 2014-01-21 DIAGNOSIS — Z720 Tobacco use: Secondary | ICD-10-CM

## 2014-01-21 DIAGNOSIS — J441 Chronic obstructive pulmonary disease with (acute) exacerbation: Secondary | ICD-10-CM

## 2014-01-21 MED ORDER — IPRATROPIUM-ALBUTEROL 0.5-2.5 (3) MG/3ML IN SOLN
3.0000 mL | Freq: Once | RESPIRATORY_TRACT | Status: AC
  Administered 2014-01-21: 3 mL via RESPIRATORY_TRACT

## 2014-01-21 MED ORDER — METHYLPREDNISOLONE ACETATE 80 MG/ML IJ SUSP
80.0000 mg | Freq: Once | INTRAMUSCULAR | Status: AC
  Administered 2014-01-21: 80 mg via INTRAMUSCULAR

## 2014-01-21 MED ORDER — AZITHROMYCIN 250 MG PO TABS: ORAL_TABLET | ORAL | Status: DC

## 2014-01-21 MED ORDER — PREDNISONE 20 MG PO TABS: ORAL_TABLET | ORAL | Status: DC

## 2014-01-21 MED ORDER — ALBUTEROL SULFATE HFA 108 (90 BASE) MCG/ACT IN AERS: 2.0000 | INHALATION_SPRAY | Freq: Four times a day (QID) | RESPIRATORY_TRACT | Status: DC | PRN

## 2014-01-21 NOTE — Addendum Note (Signed)
Addended by: Olena Mater on: 01/21/2014 03:57 PM   Modules accepted: Orders

## 2014-01-21 NOTE — Progress Notes (Signed)
Patient ID: [name] [name] MRN: [id number], DOB: [birth year], 63 y.o. Date of Encounter: @DATE @  Chief Complaint:  Chief Complaint  Patient presents with  . Bronchitis    HPI: 63 y.o. year old white female  presents with c/o cough.   Patient states that her husband saw Dr. Dennard Schaumann this morning secondary to similar symptoms and was prescribed antibiotics. Pt says that she has a lot of cough and it is getting worse. She feels drainage in her throat and congestion in her chest. Is not getting much mucus from her nose. Has had no known fevers or chills.  She says that she has been trying to quit smoking on her own cold [place]. Says that about a month ago she did quit for about 2 weeks but then started back a little bit but now has not smoked any for the past 3 days. In the past she had smoked 2-1/2   To  3 packs per day.   Past Medical History  Diagnosis Date  . COPD (chronic obstructive pulmonary disease)     PFT 2011  . Cancer     cervix  . 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 Depression   . Anxiety   . Arthritis   . Keratoconjunctivitis     Right eye  . Thyroid disease     Grave disease  . Chronic sinusitis     [facility]     Home Meds: Outpatient Prescriptions Prior to Visit  Medication Sig Dispense Refill  . ALPRAZolam (XANAX) 0.5 MG tablet Take 1 tablet (0.5 mg total) by mouth 2 (two) times daily as needed for anxiety. 45 tablet 1  . citalopram (CELEXA) 20 MG tablet Take 1 tablet (20 mg total) by mouth daily. 30 tablet 3  . dexlansoprazole (DEXILANT) 60 MG capsule Take 1 capsule (60 mg total) by mouth daily. 30 capsule 2  . erythromycin ophthalmic ointment 1 application at bedtime.    . fluconazole (DIFLUCAN) 150 MG tablet Take (1) tablet by mouth, then repeat in (3) days. 2 tablet 0  . ofloxacin (OCUFLOX) 0.3 % ophthalmic solution 1 drop 4 (four) times daily.    [name] oxyCODONE-acetaminophen (ROXICET) 5-325 MG per tablet Take 1 tablet by mouth every 8  (eight) hours as needed for severe pain. 20 tablet 0  . Polyethyl Glycol-Propyl Glycol (SYSTANE) 0.4-0.3 % GEL Apply to eye.     No facility-administered medications prior to visit.    Allergies:  Allergies  Allergen Reactions  . Ibuprofen Hives  . Naproxen Sodium Hives  . Aspirin Hives  . Hydrocodeine [Dihydrocodeine]   . Vicodin [Hydrocodone-Acetaminophen]     History   Social History  . Marital Status: Married    Spouse Name: N/A    Number of Children: N/A  . Years of Education: N/A   Occupational History  . Not on file.   Social History Main Topics  . Smoking status: Former Smoker -- 1.50 packs/day for 40 years  . Smokeless tobacco: Never Used     Comment: quit 11/2013  . Alcohol Use: Yes     Comment: 1/month  . Drug Use: No  . Sexual Activity: Not Currently    Birth Control/ Protection: Surgical   Other Topics Concern  . Not on file   Social History Narrative    Family History  Problem Relation Age of Onset  . Cancer Mother     lung  . Cancer Sister     ?  lymphoma  . Diabetes Maternal Grandmother   . Dementia Father      Review of Systems:  See HPI for pertinent ROS. All other ROS negative.    Physical Exam: Blood pressure 160/70, pulse 98, temperature 98 F (36.7 C), resp. rate 22., There is no weight on file to calculate BMI.  SaO2--93 % on RA General: WNWD WF. Appears in no acute distress. Head: Normocephalic, atraumatic. Exophthalmos. Right eye with slight redness.  nares are without discharge. Bilateral auditory canals clear, TM's are without perforation, pearly grey and translucent with reflective cone of light bilaterally. Oral cavity moist, posterior pharynx without exudate, erythema, peritonsillar abscess.  Neck: Supple. No thyromegaly. No lymphadenopathy. Lungs: Harsh wheezes throughout bilaterally.  Heart: RRR with S1 S2. No murmurs, rubs, or gallops. Abdomen: Soft, non-tender, non-distended with normoactive bowel sounds. No hepatomegaly.  No rebound/guarding. No obvious abdominal masses. Musculoskeletal:  Strength and tone normal for age. Extremities/Skin: Warm and dry. No clubbing or cyanosis. No edema. No rashes or suspicious lesions. Neuro: Alert and oriented X 3. Moves all extremities spontaneously. Gait is normal. CNII-XII grossly in tact. Psych:  Responds to questions appropriately with a normal affect.     ASSESSMENT AND PLAN:  63 y.o. year old female with   1. Acute bronchitis, unspecified organism - azithromycin (ZITHROMAX) 250 MG tablet; Day 1: Take 2 daily.  Days 2-5: Take 1 daily.  Dispense: 6 tablet; Refill: 0 - predniSONE (DELTASONE) 20 MG tablet; Take 3 daily for 2 days, then 2 daily for 2 days, then 1 daily for 2 days.  Dispense: 12 tablet; Refill: 0 - albuterol (PROVENTIL HFA;VENTOLIN HFA) 108 (90 BASE) MCG/ACT inhaler; Inhale 2 puffs into the lungs every 6 (six) hours as needed for wheezing or shortness of breath.  Dispense: 1 Inhaler; Refill: 0  2. COPD exacerbation - predniSONE (DELTASONE) 20 MG tablet; Take 3 daily for 2 days, then 2 daily for 2 days, then 1 daily for 2 days.  Dispense: 12 tablet; Refill: 0 - albuterol (PROVENTIL HFA;VENTOLIN HFA) 108 (90 BASE) MCG/ACT inhaler; Inhale 2 puffs into the lungs every 6 (six) hours as needed for wheezing or shortness of breath.  Dispense: 1 Inhaler; Refill: 0  3. TOBACCO ABUSE Continue to work on cessation.    Give Depo-Medrol 80 mg IM now Will give DuoNeb nebulizer treatment now  Repeat evaluation after the nebulizer treatment--she says that she feels much better and feels that she can breathe much better. Her lungs do sound clearer with increased airflow.  She will start the prednisone --oral-- tomorrow She is to go ahead and start the azithromycin antibiotic today and take as directed. Also discussed proper use of the albuterol.  She is to follow up if she feels any worse or if symptoms are not back to baseline at completion of medication in one  week.     Marin Olp [place], [place], [facility] 01/21/2014 2:51 PM

## 2014-02-18 ENCOUNTER — Other Ambulatory Visit: Payer: Medicare Other

## 2014-02-21 ENCOUNTER — Ambulatory Visit: Payer: Medicare Other | Admitting: Endocrinology

## 2014-02-27 ENCOUNTER — Ambulatory Visit: Payer: Medicare Other | Admitting: Family Medicine

## 2014-03-21 ENCOUNTER — Encounter (INDEPENDENT_AMBULATORY_CARE_PROVIDER_SITE_OTHER): Payer: Self-pay | Admitting: *Deleted

## 2014-04-04 ENCOUNTER — Encounter: Payer: Self-pay | Admitting: *Deleted

## 2014-04-04 ENCOUNTER — Other Ambulatory Visit: Payer: Self-pay | Admitting: Family Medicine

## 2014-04-04 NOTE — Telephone Encounter (Signed)
Ok to refill??  Last office visit 01/21/2014.  Last refill 01/16/2014, #1 refill.

## 2014-04-04 NOTE — Telephone Encounter (Signed)
Medication called to pharmacy. 

## 2014-04-04 NOTE — Telephone Encounter (Signed)
ok 

## 2014-04-09 ENCOUNTER — Encounter: Payer: Self-pay | Admitting: Family Medicine

## 2014-04-09 NOTE — Telephone Encounter (Signed)
This encounter was created in error - please disregard.

## 2014-04-24 ENCOUNTER — Encounter: Payer: Self-pay | Admitting: Family Medicine

## 2014-04-24 ENCOUNTER — Ambulatory Visit (INDEPENDENT_AMBULATORY_CARE_PROVIDER_SITE_OTHER): Payer: Medicare Other | Admitting: Family Medicine

## 2014-04-24 VITALS — BP 130/80 | HR 78 | Temp 98.5°F | Resp 16 | Ht 67.0 in | Wt 188.0 lb

## 2014-04-24 DIAGNOSIS — F411 Generalized anxiety disorder: Secondary | ICD-10-CM

## 2014-04-24 DIAGNOSIS — F17200 Nicotine dependence, unspecified, uncomplicated: Secondary | ICD-10-CM

## 2014-04-24 DIAGNOSIS — J449 Chronic obstructive pulmonary disease, unspecified: Secondary | ICD-10-CM | POA: Insufficient documentation

## 2014-04-24 DIAGNOSIS — E785 Hyperlipidemia, unspecified: Secondary | ICD-10-CM

## 2014-04-24 DIAGNOSIS — J410 Simple chronic bronchitis: Secondary | ICD-10-CM

## 2014-04-24 DIAGNOSIS — E0500 Thyrotoxicosis with diffuse goiter without thyrotoxic crisis or storm: Secondary | ICD-10-CM

## 2014-04-24 DIAGNOSIS — Z720 Tobacco use: Secondary | ICD-10-CM

## 2014-04-24 MED ORDER — OXYCODONE-ACETAMINOPHEN 5-325 MG PO TABS: 1.0000 | ORAL_TABLET | Freq: Three times a day (TID) | ORAL | Status: DC | PRN

## 2014-04-24 MED ORDER — BUPROPION HCL ER (SR) 150 MG PO TB12: 150.0000 mg | ORAL_TABLET | Freq: Two times a day (BID) | ORAL | Status: DC

## 2014-04-24 NOTE — Assessment & Plan Note (Signed)
Will try the Wellbutrin twice a day

## 2014-04-24 NOTE — Assessment & Plan Note (Signed)
Suprisingly she has rare symptoms and does not require albuterol very often She has smoked upwards of 3ppd Would initiate Spiriva if she declines

## 2014-04-24 NOTE — Assessment & Plan Note (Signed)
Pain meds refilled, discussed that eye surgeon needs to help with pain relief I do not treat eye pain with chronic pain meds,

## 2014-04-24 NOTE — Assessment & Plan Note (Signed)
Will recheck lipids, start therapy pending results

## 2014-04-24 NOTE — Assessment & Plan Note (Signed)
Continue xanax 

## 2014-04-24 NOTE — Progress Notes (Signed)
Patient ID: [name] [name], female   DOB: [birth year], 64 y.o.   MRN: [id number]   Subjective:    Patient ID: [name] [name], female    DOB: January 15, 1951, 64 y.o.   MRN: [id number]  Patient presents for 3 month F/U and Smoking Cessation  patient here for follow-up. She's had one surgery on her right eye airplane for 2 more surgeries in March but she is worried about quitting smoking. She's tried Chantix and it did not help she is also try patches. She would like to try Wellbutrin. She also requests a refill on her pain medication as her eye pain keeps her up at night. She is also still taking her Xanax as prescribed. She is due for repeat fasting labs today for her hyperlipidemia    Review Of Systems:  GEN- denies fatigue, fever, weight loss,weakness, recent illness HEENT- denies eye drainage,+ change in vision, nasal discharge, CVS- denies chest pain, palpitations RESP- denies SOB, cough, wheeze ABD- denies N/V, change in stools, abd pain GU- denies dysuria, hematuria, dribbling, incontinence MSK- denies joint pain, muscle aches, injury Neuro- denies headache, dizziness, syncope, seizure activity       Objective:    BP 130/80 mmHg  Pulse 78  Temp(Src) 98.5 F (36.9 C) (Oral)  Resp 16  Ht 5\' 7"  (1.702 m)  Wt 188 lb (85.276 kg)  BMI 29.44 kg/m2 GEN- NAD, alert and oriented x3 HEENT- PERRL, EOMI, + mild erythema of right sclera, , bulging bilat eyes R>l  With clear drainage, pink conjunctiva, MMM, oropharynx clear CVS-RRR, no murmur,  RESP-few wheeze, normal WOB, no rhonchi Ext- no edema        Assessment & Plan:      Problem List Items Addressed This Visit      Unprioritized   TOBACCO ABUSE    Will try the Wellbutrin twice a day       Hyperlipidemia   Relevant Orders   Comprehensive metabolic panel   Lipid panel   Graves' eye disease - Primary   GAD (generalized anxiety disorder)      Note: This dictation was prepared with Dragon dictation along with smaller  Company secretary. Any transcriptional errors that result from this process are unintentional.

## 2014-04-24 NOTE — Patient Instructions (Addendum)
Start wellbutrin take 1 pill in morning for 3 days, then twice a day -- We will call with lab results Continue current medications F/U 4 months

## 2014-04-25 LAB — LIPID PANEL
CHOL/HDL RATIO: 6.9 ratio
Cholesterol: 228 mg/dL — ABNORMAL HIGH (ref 0–200)
HDL: 33 mg/dL — AB (ref >39)
LDL CALC: 137 mg/dL — AB (ref 0–99)
Triglycerides: 292 mg/dL — ABNORMAL HIGH (ref <150)
VLDL: 58 mg/dL — AB (ref 0–40)

## 2014-04-25 LAB — COMPREHENSIVE METABOLIC PANEL
ALBUMIN: 4 g/dL (ref 3.5–5.2)
ALK PHOS: 105 U/L (ref 39–117)
ALT: 13 U/L (ref 0–35)
AST: 15 U/L (ref 0–37)
BUN: 10 mg/dL (ref 6–23)
CALCIUM: 9.3 mg/dL (ref 8.4–10.5)
CHLORIDE: 102 meq/L (ref 96–112)
CO2: 30 mEq/L (ref 19–32)
CREATININE: 0.62 mg/dL (ref 0.50–1.10)
Glucose, Bld: 77 mg/dL (ref 70–99)
POTASSIUM: 4.1 meq/L (ref 3.5–5.3)
Sodium: 140 mEq/L (ref 135–145)
Total Bilirubin: 0.3 mg/dL (ref 0.2–1.2)
Total Protein: 6.6 g/dL (ref 6.0–8.3)

## 2014-04-30 ENCOUNTER — Ambulatory Visit (INDEPENDENT_AMBULATORY_CARE_PROVIDER_SITE_OTHER): Payer: Medicare Other | Admitting: Internal Medicine

## 2014-05-02 ENCOUNTER — Telehealth: Payer: Self-pay | Admitting: Family Medicine

## 2014-05-02 ENCOUNTER — Other Ambulatory Visit: Payer: Self-pay | Admitting: *Deleted

## 2014-05-02 MED ORDER — ATORVASTATIN CALCIUM 20 MG PO TABS: 20.0000 mg | ORAL_TABLET | Freq: Every day | ORAL | Status: DC

## 2014-05-02 NOTE — Telephone Encounter (Signed)
Please see notes on lab results.

## 2014-05-02 NOTE — Telephone Encounter (Signed)
Patient is calling about lab results  [phone]

## 2014-05-22 ENCOUNTER — Other Ambulatory Visit: Payer: Self-pay | Admitting: *Deleted

## 2014-05-22 MED ORDER — BUPROPION HCL ER (SR) 150 MG PO TB12: 150.0000 mg | ORAL_TABLET | Freq: Two times a day (BID) | ORAL | Status: DC

## 2014-05-22 NOTE — Telephone Encounter (Signed)
Received fax requesting refill on Bupropion with 90 day supply.   Refill appropriate and filled per protocol.

## 2014-05-27 ENCOUNTER — Encounter: Payer: Self-pay | Admitting: Hematology and Oncology

## 2014-05-27 ENCOUNTER — Ambulatory Visit (HOSPITAL_BASED_OUTPATIENT_CLINIC_OR_DEPARTMENT_OTHER): Payer: Medicare Other | Admitting: Hematology and Oncology

## 2014-05-27 VITALS — BP 175/71 | HR 76 | Temp 98.2°F | Resp 18 | Ht 67.0 in | Wt 187.2 lb

## 2014-05-27 DIAGNOSIS — D0511 Intraductal carcinoma in situ of right breast: Secondary | ICD-10-CM

## 2014-05-27 DIAGNOSIS — E0500 Thyrotoxicosis with diffuse goiter without thyrotoxic crisis or storm: Secondary | ICD-10-CM

## 2014-05-27 DIAGNOSIS — Z720 Tobacco use: Secondary | ICD-10-CM

## 2014-05-27 DIAGNOSIS — Z853 Personal history of malignant neoplasm of breast: Secondary | ICD-10-CM

## 2014-05-27 DIAGNOSIS — S20161A Insect bite (nonvenomous) of breast, right breast, initial encounter: Secondary | ICD-10-CM

## 2014-05-27 DIAGNOSIS — W57XXXA Bitten or stung by nonvenomous insect and other nonvenomous arthropods, initial encounter: Secondary | ICD-10-CM

## 2014-05-27 DIAGNOSIS — F17200 Nicotine dependence, unspecified, uncomplicated: Secondary | ICD-10-CM

## 2014-05-27 NOTE — Progress Notes (Signed)
[place] OFFICE PROGRESS NOTE  Patient Care Team: Susy Frizzle, MD as PCP - General (Family Medicine) Heath Lark, MD as Consulting Physician (Hematology and Oncology)  SUMMARY OF ONCOLOGIC HISTORY:  DIAGNOSIS: Right breast DCIS status post lumpectomy, radiation therapy and adjuvant endocrine therapy  SUMMARY OF ONCOLOGIC HISTORY: This is a pleasant patient was diagnosed with DCIS after her mammogram. The patient had lumpectomy followed by radiation therapy followed by 5 years of tamoxifen. She was placed on tamoxifen from January 2009 to January 2014. She is currently on observation.  INTERVAL HISTORY: Please see below for problem oriented charting.  she is feeling well apart from visual disturbances from Grave's disease affecting the eye. She had recent eye surgery. She denies any recent abnormal breast examination, palpable mass, abnormal breast appearance or nipple changes  She complained of skin itching. REVIEW OF SYSTEMS:   Constitutional: Denies fevers, chills or abnormal weight loss Ears, nose, mouth, throat, and face: Denies mucositis or sore throat Respiratory: Denies cough, dyspnea or wheezes Cardiovascular: Denies palpitation, chest discomfort or lower extremity swelling Gastrointestinal:  Denies nausea, heartburn or change in bowel habits Lymphatics: Denies new lymphadenopathy or easy bruising Neurological:Denies numbness, tingling or new weaknesses Behavioral/Psych: Mood is stable, no new changes  All other systems were reviewed with the patient and are negative.  I have reviewed the past medical history, past surgical history, social history and family history with the patient and they are unchanged from previous note.  ALLERGIES:  is allergic to ibuprofen; naproxen sodium; aspirin; hydrocodeine; and vicodin.  MEDICATIONS:  Current Outpatient Prescriptions  Medication Sig Dispense Refill  . albuterol (PROVENTIL HFA;VENTOLIN HFA) 108 (90 BASE)  MCG/ACT inhaler Inhale 2 puffs into the lungs every 6 (six) hours as needed for wheezing or shortness of breath. 1 Inhaler 0  . ALPRAZolam (XANAX) 0.5 MG tablet Take 1 tablet (0.5 mg total) by mouth 2 (two) times daily as needed for anxiety. 45 tablet 1  . amoxicillin-clavulanate (AUGMENTIN) 875-125 MG per tablet Take 1 tablet by mouth.    [name] atorvastatin (LIPITOR) 20 MG tablet Take 1 tablet (20 mg total) by mouth daily. 90 tablet 3  . buPROPion (WELLBUTRIN SR) 150 MG 12 hr tablet Take 1 tablet (150 mg total) by mouth 2 (two) times daily. 180 tablet 3  . dexlansoprazole (DEXILANT) 60 MG capsule Take 1 capsule (60 mg total) by mouth daily. 30 capsule 2  . Polyethyl Glycol-Propyl Glycol (SYSTANE) 0.4-0.3 % GEL Apply to eye.    [name] oxyCODONE-acetaminophen (ROXICET) 5-325 MG per tablet Take 1 tablet by mouth every 8 (eight) hours as needed for severe pain. (Patient not taking: Reported on 05/27/2014) 20 tablet 0   No current facility-administered medications for this visit.    PHYSICAL EXAMINATION: ECOG PERFORMANCE STATUS: 1 - Symptomatic but completely ambulatory  Filed Vitals:   05/27/14 1241  BP: 175/71  Pulse: 76  Temp: 98.2 F (36.8 C)  Resp: 18   Filed Weights   05/27/14 1241  Weight: 187 lb 3.2 oz (84.913 kg)    GENERAL:alert, no distress and comfortable SKIN:  There are significant skin excoriation marks all over her body. A tick is seen over her right breast area.  This was subsequently removed. EYES:  Noted exophthalmus. OROPHARYNX:no exudate, no erythema and lips, buccal mucosa, and tongue normal  NECK: supple, thyroid normal size, non-tender, without nodularity LYMPH:  no palpable lymphadenopathy in the cervical, axillary or inguinal LUNGS: clear to auscultation and percussion with normal breathing effort  HEART: regular rate & rhythm and no murmurs and no lower extremity edema ABDOMEN:abdomen soft, non-tender and normal bowel sounds Musculoskeletal:no cyanosis of digits and  no clubbing  NEURO: alert & oriented x 3 with fluent speech, no focal motor/sensory deficits  bilateral breast examination were performed. No palpable abnormalities except for well-healed lumpectomy scar. LABORATORY DATA:  I have reviewed the data as listed    Component Value Date/Time   NA 140 04/24/2014 1437   NA 141 05/08/2012 1402   K 4.1 04/24/2014 1437   K 3.6 05/08/2012 1402   CL 102 04/24/2014 1437   CL 105 05/08/2012 1402   CO2 30 04/24/2014 1437   CO2 26 05/08/2012 1402   GLUCOSE 77 04/24/2014 1437   GLUCOSE 100* 05/08/2012 1402   BUN 10 04/24/2014 1437   BUN 11.2 05/08/2012 1402   CREATININE 0.62 04/24/2014 1437   CREATININE 0.90 11/10/2012 1840   CREATININE 0.7 05/08/2012 1402   CALCIUM 9.3 04/24/2014 1437   CALCIUM 9.3 05/08/2012 1402   PROT 6.6 04/24/2014 1437   PROT 7.0 05/08/2012 1402   ALBUMIN 4.0 04/24/2014 1437   ALBUMIN 3.5 05/08/2012 1402   AST 15 04/24/2014 1437   AST 21 05/08/2012 1402   ALT 13 04/24/2014 1437   ALT 23 05/08/2012 1402   ALKPHOS 105 04/24/2014 1437   ALKPHOS 110 05/08/2012 1402   BILITOT 0.3 04/24/2014 1437   BILITOT 0.26 05/08/2012 1402   GFRNONAA >60 10/06/2010 1407   GFRAA >60 10/06/2010 1407    No results found for: SPEP, UPEP  Lab Results  Component Value Date   WBC 6.6 01/16/2014   NEUTROABS 4.2 01/16/2014   HGB 13.4 01/16/2014   HCT 39.1 01/16/2014   MCV 86.1 01/16/2014   PLT 235 01/16/2014      Chemistry      Component Value Date/Time   NA 140 04/24/2014 1437   NA 141 05/08/2012 1402   K 4.1 04/24/2014 1437   K 3.6 05/08/2012 1402   CL 102 04/24/2014 1437   CL 105 05/08/2012 1402   CO2 30 04/24/2014 1437   CO2 26 05/08/2012 1402   BUN 10 04/24/2014 1437   BUN 11.2 05/08/2012 1402   CREATININE 0.62 04/24/2014 1437   CREATININE 0.90 11/10/2012 1840   CREATININE 0.7 05/08/2012 1402      Component Value Date/Time   CALCIUM 9.3 04/24/2014 1437   CALCIUM 9.3 05/08/2012 1402   ALKPHOS 105 04/24/2014 1437    ALKPHOS 110 05/08/2012 1402   AST 15 04/24/2014 1437   AST 21 05/08/2012 1402   ALT 13 04/24/2014 1437   ALT 23 05/08/2012 1402   BILITOT 0.3 04/24/2014 1437   BILITOT 0.26 05/08/2012 1402      ASSESSMENT & PLAN:  DCIS (ductal carcinoma in situ)  Clinically and radiographically, she has no signs of disease recurrence. She has completed adjuvant treatment. I will discharge her care from this office. She will continue annual mammogram and follow-up with PCP   Graves' eye disease  The patient is not satisfied with her care. She requests a second opinion. I recommend endocrinology consultation at [facility]. She will contact PCP for referral. Another possibility would be to consider radiation treatment. She will think about it.   TOBACCO ABUSE I spent some time counseling the patient the importance of tobacco cessation. she is currently attempting to quit on her own and with the use of Wellbutrin   Tick bite of right female breast  She has skin  excoriation from scratching and an active tick is found over her right breast. Using aseptic technique and a tweezer, the tick is removed from her right chest wall. The patient is currently taking antibiotics for her sinus infection. If she has any new symptoms of fevers and joint pain, I recommend close follow-up with PCP.    No orders of the defined types were placed in this encounter.   All questions were answered. The patient knows to call the clinic with any problems, questions or concerns. No barriers to learning was detected. I spent 25 minutes counseling the patient face to face. The total time spent in the appointment was 30 minutes and more than 50% was on counseling and review of test results     [facility], [place], MD 05/27/2014 1:36 PM

## 2014-05-27 NOTE — Assessment & Plan Note (Signed)
She has skin excoriation from scratching and an active tick is found over her right breast. Using aseptic technique and a tweezer, the tick is removed from her right chest wall. The patient is currently taking antibiotics for her sinus infection. If she has any new symptoms of fevers and joint pain, I recommend close follow-up with PCP.

## 2014-05-27 NOTE — Assessment & Plan Note (Signed)
The patient is not satisfied with her care. She requests a second opinion. I recommend endocrinology consultation at [facility]. She will contact PCP for referral. Another possibility would be to consider radiation treatment. She will think about it.

## 2014-05-27 NOTE — Assessment & Plan Note (Signed)
I spent some time counseling the patient the importance of tobacco cessation. she is currently attempting to quit on her own and with the use of Wellbutrin

## 2014-05-27 NOTE — Assessment & Plan Note (Signed)
Clinically and radiographically, she has no signs of disease recurrence. She has completed adjuvant treatment. I will discharge her care from this office. She will continue annual mammogram and follow-up with PCP

## 2014-05-30 ENCOUNTER — Telehealth: Payer: Self-pay | Admitting: *Deleted

## 2014-05-30 NOTE — Telephone Encounter (Signed)
I have not seen this patient.

## 2014-05-30 NOTE — Telephone Encounter (Signed)
Pt called needng refill on Alprazolam 0.5mg   [facility]

## 2014-05-31 ENCOUNTER — Other Ambulatory Visit: Payer: Self-pay | Admitting: Family Medicine

## 2014-05-31 MED ORDER — ALPRAZOLAM 0.5 MG PO TABS: 0.5000 mg | ORAL_TABLET | Freq: Two times a day (BID) | ORAL | Status: DC | PRN

## 2014-05-31 NOTE — Telephone Encounter (Signed)
Okay to refill? 

## 2014-05-31 NOTE — Telephone Encounter (Signed)
Med phoned in to pharmacy 

## 2014-06-04 ENCOUNTER — Telehealth (INDEPENDENT_AMBULATORY_CARE_PROVIDER_SITE_OTHER): Payer: Self-pay | Admitting: *Deleted

## 2014-06-04 ENCOUNTER — Encounter (INDEPENDENT_AMBULATORY_CARE_PROVIDER_SITE_OTHER): Payer: Self-pay | Admitting: *Deleted

## 2014-06-04 NOTE — Telephone Encounter (Signed)
[name] [name] SHOWED for her apt with Deberah Castle, NP on 04/30/14. A NS letter has been mailed.

## 2014-07-31 ENCOUNTER — Telehealth: Payer: Self-pay | Admitting: *Deleted

## 2014-07-31 NOTE — Telephone Encounter (Signed)
Pt called stating needs refill on Alprazolam  Last ov 04/24/13  Last rf 07/01/14

## 2014-08-01 MED ORDER — ALPRAZOLAM 0.5 MG PO TABS: 0.5000 mg | ORAL_TABLET | Freq: Two times a day (BID) | ORAL | Status: DC | PRN

## 2014-08-01 NOTE — Telephone Encounter (Signed)
Script called in to pharmacy  

## 2014-08-01 NOTE — Telephone Encounter (Signed)
ok 

## 2014-09-05 ENCOUNTER — Ambulatory Visit: Payer: Medicare Other | Admitting: Physician Assistant

## 2014-09-09 ENCOUNTER — Encounter: Payer: Self-pay | Admitting: Physician Assistant

## 2014-09-09 ENCOUNTER — Ambulatory Visit (INDEPENDENT_AMBULATORY_CARE_PROVIDER_SITE_OTHER): Payer: Medicare Other | Admitting: Physician Assistant

## 2014-09-09 VITALS — BP 132/74 | HR 82 | Temp 97.9°F | Resp 18 | Ht 67.0 in | Wt 185.0 lb

## 2014-09-09 DIAGNOSIS — R309 Painful micturition, unspecified: Secondary | ICD-10-CM

## 2014-09-09 DIAGNOSIS — D0511 Intraductal carcinoma in situ of right breast: Secondary | ICD-10-CM

## 2014-09-09 DIAGNOSIS — G4700 Insomnia, unspecified: Secondary | ICD-10-CM | POA: Diagnosis not present

## 2014-09-09 DIAGNOSIS — R2231 Localized swelling, mass and lump, right upper limb: Secondary | ICD-10-CM

## 2014-09-09 DIAGNOSIS — R32 Unspecified urinary incontinence: Secondary | ICD-10-CM | POA: Diagnosis not present

## 2014-09-09 DIAGNOSIS — N3001 Acute cystitis with hematuria: Secondary | ICD-10-CM | POA: Diagnosis not present

## 2014-09-09 LAB — URINALYSIS, MICROSCOPIC ONLY
CASTS: NONE SEEN
Crystals: NONE SEEN

## 2014-09-09 LAB — URINALYSIS, ROUTINE W REFLEX MICROSCOPIC
Bilirubin Urine: NEGATIVE
GLUCOSE, UA: NEGATIVE mg/dL
KETONES UR: NEGATIVE mg/dL
Nitrite: POSITIVE — AB
SPECIFIC GRAVITY, URINE: 1.015 (ref 1.005–1.030)
Urobilinogen, UA: 0.2 mg/dL (ref 0.0–1.0)
pH: 6.5 (ref 5.0–8.0)

## 2014-09-09 MED ORDER — CIPROFLOXACIN HCL 500 MG PO TABS: 500.0000 mg | ORAL_TABLET | Freq: Two times a day (BID) | ORAL | Status: DC

## 2014-09-09 MED ORDER — FESOTERODINE FUMARATE ER 4 MG PO TB24: 4.0000 mg | ORAL_TABLET | Freq: Every day | ORAL | Status: DC

## 2014-09-09 MED ORDER — ZOLPIDEM TARTRATE 10 MG PO TABS: 10.0000 mg | ORAL_TABLET | Freq: Every evening | ORAL | Status: DC | PRN

## 2014-09-10 NOTE — Progress Notes (Signed)
Patient ID: [name] [name] MRN: [id number], DOB: 1950-06-24, 64 y.o. Date of Encounter: @DATE @  Chief Complaint:  Chief Complaint  Patient presents with  . Pain with urination  . Check knot under right arm  . Trouble sleeping - fatigue    HPI: 64 y.o. year old female  presents with above complaints.  States that in general she wakes up about 6 times per night to have to urinate. Says that during the day she also "leaks "with incontinence. She has these symptoms all the time and not just recently to suggest UTI.  However says that last week she was having some pain in her low back and side that felt similar to symptoms she has had with prior UTIs. Has had no dysuria but says that still the discomfort she has felt has been the type of symptoms she's had with UTIs in the past. No fevers or chills.  States that she was doing her monthly breast exam 2 days ago when she felt something in her right upper arm in the axilla. She has a history of right breast cancer diagnosed in 2009. Therefore she does breast exams very routinely and knows that this is a new spot that had not been there in the past. Says her last mammogram was November 2015 normal.  Says that she sleeps 2 or 3 hours and then wakes up. Says that she has a hard time going to sleep and then she has a hard time getting back to sleep when she wakes up in the night. Says that she has had a past history of insomnia and used Ambien in the past. Says she thinks that the Ambien worked for her and is interested in trying this again.  No other complaints or concerns today.   Past Medical History  Diagnosis Date  . COPD (chronic obstructive pulmonary disease)     PFT 2011  . Cancer     cervix  . 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 Depression   . Anxiety   . Arthritis   . Keratoconjunctivitis     Right eye  . Thyroid disease     Grave disease  . Chronic sinusitis     [facility]     Home Meds: Outpatient  Prescriptions Prior to Visit  Medication Sig Dispense Refill  . albuterol (PROVENTIL HFA;VENTOLIN HFA) 108 (90 BASE) MCG/ACT inhaler Inhale 2 puffs into the lungs every 6 (six) hours as needed for wheezing or shortness of breath. 1 Inhaler 0  . ALPRAZolam (XANAX) 0.5 MG tablet Take 1 tablet (0.5 mg total) by mouth 2 (two) times daily as needed for anxiety. 45 tablet 1  . atorvastatin (LIPITOR) 20 MG tablet Take 1 tablet (20 mg total) by mouth daily. 90 tablet 3  . buPROPion (WELLBUTRIN SR) 150 MG 12 hr tablet Take 1 tablet (150 mg total) by mouth 2 (two) times daily. 180 tablet 3  . dexlansoprazole (DEXILANT) 60 MG capsule Take 1 capsule (60 mg total) by mouth daily. 30 capsule 2  . Polyethyl Glycol-Propyl Glycol (SYSTANE) 0.4-0.3 % GEL Apply to eye.    [name] oxyCODONE-acetaminophen (ROXICET) 5-325 MG per tablet Take 1 tablet by mouth every 8 (eight) hours as needed for severe pain. (Patient not taking: Reported on 09/09/2014) 20 tablet 0   No facility-administered medications prior to visit.    Allergies:  Allergies  Allergen Reactions  . Ibuprofen Hives  . Naproxen Sodium Hives  . Aspirin Hives  .  Hydrocodeine [Dihydrocodeine]   . Vicodin [Hydrocodone-Acetaminophen]     History   Social History  . Marital Status: Married    Spouse Name: N/A  . Number of Children: N/A  . Years of Education: N/A   Occupational History  . Not on file.   Social History Main Topics  . Smoking status: Current Every Day Smoker -- 1.50 packs/day for 40 years  . Smokeless tobacco: Never Used     Comment: quit 11/2013  . Alcohol Use: 0.0 oz/week    0 Standard drinks or equivalent per week     Comment: 1/month  . Drug Use: No  . Sexual Activity: Not Currently    Birth Control/ Protection: Surgical   Other Topics Concern  . Not on file   Social History Narrative    Family History  Problem Relation Age of Onset  . Cancer Mother     lung  . Cancer Sister     ?lymphoma  . Diabetes Maternal  Grandmother   . Dementia Father      Review of Systems:  See HPI for pertinent ROS. All other ROS negative.    Physical Exam: Blood pressure 132/74, pulse 82, temperature 97.9 F (36.6 C), temperature source Oral, resp. rate 18, height 5\' 7"  (1.702 m), weight 185 lb (83.915 kg)., Body mass index is 28.97 kg/(m^2). General: Overweight white female  bulging right eye. Appears in no acute distress. Neck: Supple. No thyromegaly. No lymphadenopathy. Lungs: Clear bilaterally to auscultation without wheezes, rales, or rhonchi. Breathing is unlabored. Heart: RRR with S1 S2. No murmurs, rubs, or gallops. Breast Exam: Right Axilla: Approx 0.5 cm hard nodule (feels like a bee-bee). No other masses. Remainder of breast exam normal.  Musculoskeletal:  Strength and tone normal for age. Extremities/Skin: Warm and dry. No clubbing or cyanosis. No edema. No rashes or suspicious lesions. Neuro: Alert and oriented X 3. Moves all extremities spontaneously. Gait is normal. CNII-XII grossly in tact. Psych:  Responds to questions appropriately with a normal affect.     ASSESSMENT AND PLAN:  64 y.o. year old female with  1. Painful urination - Urinalysis, Routine w reflex microscopic (not at [facility]) - Urine culture  2. DCIS (ductal carcinoma in situ), right - [place] Extrem Up Right Ltd; Future - MM DIAG BREAST TOMO UNI RIGHT; Future  3. Insomnia - zolpidem (AMBIEN) 10 MG tablet; Take 1 tablet (10 mg total) by mouth at bedtime as needed for sleep.  Dispense: 30 tablet; Refill: 1 Discussed proper use of medication. Told her to call us if this medication is not working to improve her insomnia or if this causes adverse effects.  4. Urinary incontinence, unspecified incontinence type - fesoterodine (TOVIAZ) 4 MG TB24 tablet; Take 1 tablet (4 mg total) by mouth daily.  Dispense: 30 tablet; Refill: 4 Discussed that this medication comes in a higher dose. Told her to call us if this dose is not controlling her  symptoms and her incontinence does not stop. Call if medication causes any adverse effects. I reviewed Dr. Dorian Heckle complete physical exam note from 11/28/2013. Stated that patient has had complete hysterectomy. On that date GU exam was normal urethra was in normal position. No bladder prolapse noted.  5. Acute cystitis with hematuria UA c/w UTI. Start Cipro and take as directed and complete all of it. Will follow up culture results also. - Urine culture - ciprofloxacin (CIPRO) 500 MG tablet; Take 1 tablet (500 mg total) by mouth 2 (two) times daily.  Dispense:  14 tablet; Refill: 0  6. Mass of axilla, right - [place] Extrem Up Right Ltd; Future - MM DIAG BREAST TOMO UNI RIGHT; Future   Signed, Olean Ree [place], [place], [facility] 09/10/2014 5:45 PM

## 2014-09-11 LAB — URINE CULTURE

## 2014-09-13 ENCOUNTER — Ambulatory Visit
Admission: RE | Admit: 2014-09-13 | Discharge: 2014-09-13 | Disposition: A | Discharge status: Home / Self Care | Payer: Medicare Other | Source: Ambulatory Visit | Attending: Physician Assistant | Admitting: Physician Assistant

## 2014-09-13 DIAGNOSIS — R2231 Localized swelling, mass and lump, right upper limb: Secondary | ICD-10-CM

## 2014-09-13 DIAGNOSIS — D0511 Intraductal carcinoma in situ of right breast: Secondary | ICD-10-CM

## 2014-09-17 ENCOUNTER — Ambulatory Visit: Payer: Medicare Other | Admitting: Family Medicine

## 2014-10-01 ENCOUNTER — Telehealth: Payer: Self-pay | Admitting: *Deleted

## 2014-10-01 MED ORDER — ALPRAZOLAM 0.5 MG PO TABS: 0.5000 mg | ORAL_TABLET | Freq: Two times a day (BID) | ORAL | Status: DC | PRN

## 2014-10-01 NOTE — Telephone Encounter (Signed)
Pt calling needing refill on ALPRAZOLAM 0.5mg , [facility] [address]  Last refill 08/01/14

## 2014-10-01 NOTE — Telephone Encounter (Signed)
Script called into pharmacy and pt aware. 

## 2014-10-01 NOTE — Telephone Encounter (Signed)
Okay to refill , give 2 

## 2014-10-22 LAB — TSH: TSH: 6.44 u[IU]/mL — AB (ref 0.41–5.90)

## 2014-10-28 ENCOUNTER — Encounter: Payer: Self-pay | Admitting: Endocrinology

## 2014-10-28 ENCOUNTER — Ambulatory Visit (INDEPENDENT_AMBULATORY_CARE_PROVIDER_SITE_OTHER): Payer: Medicare Other | Admitting: Endocrinology

## 2014-10-28 VITALS — BP 138/72 | HR 89 | Temp 97.9°F | Resp 16 | Ht 67.0 in | Wt 189.4 lb

## 2014-10-28 DIAGNOSIS — E890 Postprocedural hypothyroidism: Secondary | ICD-10-CM | POA: Diagnosis not present

## 2014-10-28 DIAGNOSIS — E0500 Thyrotoxicosis with diffuse goiter without thyrotoxic crisis or storm: Secondary | ICD-10-CM | POA: Diagnosis not present

## 2014-10-28 MED ORDER — LEVOTHYROXINE SODIUM 25 MCG PO TABS: 25.0000 ug | ORAL_TABLET | Freq: Every day | ORAL | Status: DC

## 2014-10-28 NOTE — Patient Instructions (Signed)
Start

## 2014-10-28 NOTE — Progress Notes (Signed)
Patient ID: [name] [name], female   DOB: [birth year], 64 y.o.   MRN: [id number]   Reason for Appointment:  Follow-up of thyroid    History of Present Illness:   Her Graves' hyperthyroidism  was first diagnosed in 2004 At that time she had very typical symptoms of weight loss, heat intolerance, diarrhea, shakiness and nervousness Details of her initial evaluation are not available ; was treated by her PCP with I-131, 14 mCi She thinks her thyroid level got low sodium after this  Not clear what doses of thyroid supplement she had been taking subsequently but in 2011 when she was taking 25 g and TSH was relatively low her levothyroxine was stopped  Subsequently her thyroid levels have been normal and she has not had any thyroid supplements On her last visit about a year ago her TSH was normal at 0.52  The last few months she is complaining of feeling significant only tired.  Does not have cord intolerance, in fact tends to get hot easily TSH done by her ophthalmologist now shows this to be high at 6.4 with normal free T4        The thyroid levels have previously been as follows:  Lab Results  Component Value Date   TSH 6.44* 10/22/2014   TSH 0.52 11/22/2013   TSH 0.471 10/29/2013   FREET4 1.17 10/29/2013   FREET4 0.98 06/03/2010   FREET4 0.93 02/25/2010       Past Medical History  Diagnosis Date  . COPD (chronic obstructive pulmonary disease)     PFT 2011  . Cancer     cervix  . 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 Depression   . Anxiety   . Arthritis   . Keratoconjunctivitis     Right eye  . Thyroid disease     Grave disease  . Chronic sinusitis     [facility]    Past Surgical History  Procedure Laterality Date  . Breast surgery    . Ectopic pregnancy surgery    . Abdominal hysterectomy      cervical cancer  . Total hip arthroplasty Left     Family History  Problem Relation Age of Onset  . Cancer Mother     lung  . Cancer Sister    ?lymphoma  . Diabetes Maternal Grandmother   . Dementia Father     Social History:  reports that she has been smoking.  She has never used smokeless tobacco. She reports that she drinks alcohol. She reports that she does not use illicit drugs.  Allergies:  Allergies  Allergen Reactions  . Ibuprofen Hives  . Naproxen Sodium Hives  . Aspirin Hives  . Latex Rash  . Hydrocodeine [Dihydrocodeine]   . Vicodin [Hydrocodone-Acetaminophen]       Medication List       This list is accurate as of: 10/28/14 11:59 PM.  Always use your most recent med list.               albuterol 108 (90 BASE) MCG/ACT inhaler  Commonly known as:  PROVENTIL HFA;VENTOLIN HFA  Inhale 2 puffs into the lungs every 6 (six) hours as needed for wheezing or shortness of breath.     ALPRAZolam 0.5 MG tablet  Commonly known as:  XANAX  Take 1 tablet (0.5 mg total) by mouth 2 (two) times daily as needed for anxiety.     atorvastatin 20 MG tablet  Commonly known as:  LIPITOR  Take 1 tablet (20 mg total) by mouth daily.     buPROPion 150 MG 12 hr tablet  Commonly known as:  WELLBUTRIN SR  Take 1 tablet (150 mg total) by mouth 2 (two) times daily.     ciprofloxacin 500 MG tablet  Commonly known as:  CIPRO  Take 1 tablet (500 mg total) by mouth 2 (two) times daily.     dexlansoprazole 60 MG capsule  Commonly known as:  DEXILANT  Take 1 capsule (60 mg total) by mouth daily.     fesoterodine 4 MG Tb24 tablet  Commonly known as:  TOVIAZ  Take 1 tablet (4 mg total) by mouth daily.     levothyroxine 25 MCG tablet  Commonly known as:  LEVOTHROID  Take 1 tablet (25 mcg total) by mouth daily before breakfast.     oxyCODONE-acetaminophen 5-325 MG per tablet  Commonly known as:  ROXICET  Take 1 tablet by mouth every 8 (eight) hours as needed for severe pain.     SYSTANE 0.4-0.3 % Gel  Generic drug:  Polyethyl Glycol-Propyl Glycol  Apply to eye.     zolpidem 10 MG tablet  Commonly known as:  AMBIEN  Take  1 tablet (10 mg total) by mouth at bedtime as needed for sleep.        Review of Systems:  GI: Has occasional diarrhea    She  has a history of depression and anxiety, currently taking Xanax as needed   Examination:    BP 138/72 mmHg  Pulse 89  Temp(Src) 97.9 F (36.6 C)  Resp 16  Ht 5\' 7"  (1.702 m)  Wt 189 lb 6.4 oz (85.911 kg)  BMI 29.66 kg/m2  SpO2 93%   General Appearance: pleasant, looks well     Eyes: She has proptosis on the right side with  lid retraction, both upper and lower. Minimal proptosis on the left Minimal swelling of the eyelids present.      Neurological: REFLEXES: at biceps are normal  Extremities: no edema   Assessments:    GRAVES disease: She  continues to have issues with ophthalmopathy with some improvement after multiple surgical interventions   History of hyperthyroidism: She  now appears to be mildly hypothyroid again after several years of having transient mild hypothyroidism following ablation of her thyroid with I-131.  May have some fatigue related to this which is nonspecific.  TSH previously had been consistently normal or relatively low  Plan:  Trial of Synthroid 25 g daily She will need follow-up TSH levels Continue follow-up with neuro-ophthalmologist  Followup in  to 6 weeks  Chanley Mcenery 10/29/2014, 8:50 PM

## 2014-11-05 ENCOUNTER — Other Ambulatory Visit: Payer: Self-pay | Admitting: Physician Assistant

## 2014-11-05 NOTE — Telephone Encounter (Signed)
Medication refilled per protocol. 

## 2014-11-05 NOTE — Telephone Encounter (Signed)
ok 

## 2014-11-05 NOTE — Telephone Encounter (Signed)
Ok to refill??  Last office visit/ refill 09/09/2014, #1 refills.

## 2014-12-05 ENCOUNTER — Other Ambulatory Visit (INDEPENDENT_AMBULATORY_CARE_PROVIDER_SITE_OTHER): Payer: Medicare Other

## 2014-12-05 DIAGNOSIS — E890 Postprocedural hypothyroidism: Secondary | ICD-10-CM

## 2014-12-05 LAB — T4, FREE: FREE T4: 0.79 ng/dL (ref 0.60–1.60)

## 2014-12-05 LAB — TSH: TSH: 1.9 u[IU]/mL (ref 0.35–4.50)

## 2014-12-09 ENCOUNTER — Encounter: Payer: Self-pay | Admitting: Endocrinology

## 2014-12-09 ENCOUNTER — Ambulatory Visit (INDEPENDENT_AMBULATORY_CARE_PROVIDER_SITE_OTHER): Payer: Medicare Other | Admitting: Endocrinology

## 2014-12-09 VITALS — BP 138/62 | HR 87 | Temp 98.5°F | Resp 16 | Ht 67.0 in | Wt 190.8 lb

## 2014-12-09 DIAGNOSIS — E890 Postprocedural hypothyroidism: Secondary | ICD-10-CM | POA: Diagnosis not present

## 2014-12-09 MED ORDER — LEVOTHYROXINE SODIUM 25 MCG PO TABS: 25.0000 ug | ORAL_TABLET | Freq: Every day | ORAL | Status: DC

## 2014-12-09 NOTE — Progress Notes (Signed)
Patient ID: [name] [name], female   DOB: Mar 15, 1951, 64 y.o.   MRN: [id number]   Reason for Appointment:  Follow-up of thyroid    History of Present Illness:   Her Graves' hyperthyroidism  was first diagnosed in 2004 At that time she had very typical symptoms of weight loss, heat intolerance, diarrhea, shakiness and nervousness Details of her initial evaluation are not available; was treated by her PCP with I-131, 14 mCi She thinks her thyroid level got low sodium after this  Not clear what doses of thyroid supplement she had been taking subsequently but in 2011 when she was taking 25 g and TSH was relatively low her levothyroxine was stopped  Subsequently her thyroid levels had been normal and she has not had any thyroid supplements  Recent history: She had a high TSH of 6.4 checked by her ophthalmologist and she was referred back for further management She was also complaining of feeling more tired but no other symptoms of dry skin, cold intolerance or edema With taking the Synthroid 25 g since 10/2014 she has had less fatigue  TSH is back to normal   The thyroid levels have previously been as follows:  Lab Results  Component Value Date   TSH 1.90 12/05/2014   TSH 6.44* 10/22/2014   TSH 0.52 11/22/2013   FREET4 0.79 12/05/2014   FREET4 1.17 10/29/2013   FREET4 0.98 06/03/2010       Past Medical History  Diagnosis Date  . COPD (chronic obstructive pulmonary disease)     PFT 2011  . Cancer     cervix  . 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 Depression   . Anxiety   . Arthritis   . Keratoconjunctivitis     Right eye  . Thyroid disease     Grave disease  . Chronic sinusitis     [facility]    Past Surgical History  Procedure Laterality Date  . Breast surgery    . Ectopic pregnancy surgery    . Abdominal hysterectomy      cervical cancer  . Total hip arthroplasty Left     Family History  Problem Relation Age of Onset  . Cancer Mother      lung  . Cancer Sister     ?lymphoma  . Diabetes Maternal Grandmother   . Dementia Father     Social History:  reports that she has been smoking.  She has never used smokeless tobacco. She reports that she drinks alcohol. She reports that she does not use illicit drugs.  Allergies:  Allergies  Allergen Reactions  . Ibuprofen Hives  . Naproxen Sodium Hives  . Aspirin Hives  . Latex Rash  . Hydrocodeine [Dihydrocodeine]   . Vicodin [Hydrocodone-Acetaminophen]       Medication List       This list is accurate as of: 12/09/14  3:08 PM.  Always use your most recent med list.               albuterol 108 (90 BASE) MCG/ACT inhaler  Commonly known as:  PROVENTIL HFA;VENTOLIN HFA  Inhale 2 puffs into the lungs every 6 (six) hours as needed for wheezing or shortness of breath.     ALPRAZolam 0.5 MG tablet  Commonly known as:  XANAX  Take 1 tablet (0.5 mg total) by mouth 2 (two) times daily as needed for anxiety.     atorvastatin 20 MG tablet  Commonly known as:  LIPITOR  Take 1 tablet (20 mg total) by mouth daily.     buPROPion 150 MG 12 hr tablet  Commonly known as:  WELLBUTRIN SR  Take 1 tablet (150 mg total) by mouth 2 (two) times daily.     ciprofloxacin 500 MG tablet  Commonly known as:  CIPRO  Take 1 tablet (500 mg total) by mouth 2 (two) times daily.     dexlansoprazole 60 MG capsule  Commonly known as:  DEXILANT  Take 1 capsule (60 mg total) by mouth daily.     fesoterodine 4 MG Tb24 tablet  Commonly known as:  TOVIAZ  Take 1 tablet (4 mg total) by mouth daily.     levothyroxine 25 MCG tablet  Commonly known as:  LEVOTHROID  Take 1 tablet (25 mcg total) by mouth daily before breakfast.     oxyCODONE-acetaminophen 5-325 MG per tablet  Commonly known as:  ROXICET  Take 1 tablet by mouth every 8 (eight) hours as needed for severe pain.     SYSTANE 0.4-0.3 % Gel ophthalmic gel  Generic drug:  Polyethyl Glycol-Propyl Glycol  Apply to eye.     zolpidem  10 MG tablet  Commonly known as:  AMBIEN  TAKE 1 TABLET AT BEDTIME AS NEEDED FOR SLEEP        Review of Systems:  She is concerned about her gradual weight gain  She  has a history of depression, insomnia and anxiety, currently taking Xanax as needed   Examination:    Ht 5\' 7"  (1.702 m)  Wt 190 lb 12.8 oz (86.546 kg)  BMI 29.88 kg/m2   General Appearance: pleasant, looks well     Eyes: She has mild proptosis on the right side with swelling of the eyelids Minimal proptosis on the left Neurological: REFLEXES: at biceps are brisk  Extremities: no edema   Assessments:    GRAVES disease: She  continues to have issues with ophthalmopathy with  improvement after multiple surgical interventions   History of hyperthyroidism: She has mild hypothyroidism which appears to have been symptomatic since she is feeling subjectively better with starting supplementation with 25 g levothyroxine. TSH is excellent now However discussed with the patient that her weight gain is unlikely to be from her thyroid since her levels are normal and she is still not able to lose weight  Plan:   Continue Synthroid 25 g daily She will need regular follow-up   Continue follow-up with neuro-ophthalmologist  Followup in  12 weeks  KUMAR,AJAY 12/09/2014, 3:08 PM

## 2014-12-20 ENCOUNTER — Telehealth: Payer: Self-pay | Admitting: Family Medicine

## 2014-12-20 NOTE — Telephone Encounter (Signed)
PA was submitted by someone for her Ambien and it was approved until further notice per [employer]. Pt was left a message on vm making her aware of the approval and to call her pharm to have filled.

## 2014-12-23 ENCOUNTER — Other Ambulatory Visit: Payer: Self-pay | Admitting: Endocrinology

## 2014-12-30 ENCOUNTER — Telehealth: Payer: Self-pay | Admitting: *Deleted

## 2014-12-30 MED ORDER — ALPRAZOLAM 0.5 MG PO TABS: 0.5000 mg | ORAL_TABLET | Freq: Two times a day (BID) | ORAL | Status: DC | PRN

## 2014-12-30 NOTE — Telephone Encounter (Signed)
Pt called needing refill on Xanax   Last refill 11/30/14  Last ov: 09/09/14  ?ok to refill

## 2014-12-30 NOTE — Telephone Encounter (Signed)
Approved. #[id number]

## 2014-12-30 NOTE — Telephone Encounter (Signed)
Medication refilled per protocol. 

## 2015-01-15 ENCOUNTER — Encounter: Payer: Self-pay | Admitting: Family Medicine

## 2015-01-17 ENCOUNTER — Telehealth: Payer: Self-pay | Admitting: *Deleted

## 2015-01-17 MED ORDER — ZOLPIDEM TARTRATE 10 MG PO TABS: 10.0000 mg | ORAL_TABLET | Freq: Every evening | ORAL | Status: DC | PRN

## 2015-01-17 NOTE — Telephone Encounter (Signed)
Script called in to pharmacy, pt aware. 

## 2015-01-17 NOTE — Telephone Encounter (Signed)
Pt called needs refiil on Ambien  Last refill:12/06/14  Last ov:08/29/14  [facility] [address]

## 2015-01-17 NOTE — Telephone Encounter (Signed)
ok 

## 2015-02-24 ENCOUNTER — Encounter: Payer: Self-pay | Admitting: Family Medicine

## 2015-03-05 ENCOUNTER — Ambulatory Visit (INDEPENDENT_AMBULATORY_CARE_PROVIDER_SITE_OTHER): Payer: Medicare Other | Admitting: Physician Assistant

## 2015-03-05 ENCOUNTER — Encounter: Payer: Self-pay | Admitting: Physician Assistant

## 2015-03-05 VITALS — BP 122/72 | HR 92 | Temp 98.1°F | Resp 18 | Wt 193.0 lb

## 2015-03-05 DIAGNOSIS — N390 Urinary tract infection, site not specified: Secondary | ICD-10-CM | POA: Diagnosis not present

## 2015-03-05 DIAGNOSIS — R3915 Urgency of urination: Secondary | ICD-10-CM

## 2015-03-05 LAB — URINALYSIS, ROUTINE W REFLEX MICROSCOPIC
BILIRUBIN URINE: NEGATIVE
Glucose, UA: NEGATIVE
Ketones, ur: NEGATIVE
Nitrite: NEGATIVE
PROTEIN: NEGATIVE
Specific Gravity, Urine: 1.02 (ref 1.001–1.035)
pH: 5.5 (ref 5.0–8.0)

## 2015-03-05 LAB — URINALYSIS, MICROSCOPIC ONLY
Casts: NONE SEEN [LPF]
Crystals: NONE SEEN [HPF]
Yeast: NONE SEEN [HPF]

## 2015-03-05 MED ORDER — CIPROFLOXACIN HCL 500 MG PO TABS: 500.0000 mg | ORAL_TABLET | Freq: Two times a day (BID) | ORAL | Status: DC

## 2015-03-05 NOTE — Progress Notes (Signed)
Patient ID: [name] [name] MRN: [id number], DOB: [birth year], 64 y.o. Date of Encounter: 03/05/2015, 3:27 PM    Chief Complaint:  Chief Complaint  Patient presents with  . c/o poss UTI    flank and pelvic discomfort     HPI: 64 y.o. year old white female says that she has been having some achy type discomfort around her low back and also some burning when she urinates. Is that she had a UTI one year ago and had the exact same symptoms with that infection. Has had no fevers or chills.     Home Meds:   Outpatient Prescriptions Prior to Visit  Medication Sig Dispense Refill  . ALPRAZolam (XANAX) 0.5 MG tablet Take 1 tablet (0.5 mg total) by mouth 2 (two) times daily as needed for anxiety. 45 tablet 2  . atorvastatin (LIPITOR) 20 MG tablet Take 1 tablet (20 mg total) by mouth daily. 90 tablet 3  . buPROPion (WELLBUTRIN SR) 150 MG 12 hr tablet Take 1 tablet (150 mg total) by mouth 2 (two) times daily. 180 tablet 3  . dexlansoprazole (DEXILANT) 60 MG capsule Take 1 capsule (60 mg total) by mouth daily. 30 capsule 2  . fesoterodine (TOVIAZ) 4 MG TB24 tablet Take 1 tablet (4 mg total) by mouth daily. 30 tablet 4  . levothyroxine (LEVOTHROID) 25 MCG tablet Take 1 tablet (25 mcg total) by mouth daily before breakfast. 30 tablet 5  . zolpidem (AMBIEN) 10 MG tablet Take 1 tablet (10 mg total) by mouth at bedtime as needed. for sleep 30 tablet 1  . Polyethyl Glycol-Propyl Glycol (SYSTANE) 0.4-0.3 % GEL Apply to eye. Reported on 03/05/2015    . albuterol (PROVENTIL HFA;VENTOLIN HFA) 108 (90 BASE) MCG/ACT inhaler Inhale 2 puffs into the lungs every 6 (six) hours as needed for wheezing or shortness of breath. (Patient not taking: Reported on 10/28/2014) 1 Inhaler 0  . ciprofloxacin (CIPRO) 500 MG tablet Take 1 tablet (500 mg total) by mouth 2 (two) times daily. (Patient not taking: Reported on 10/28/2014) 14 tablet 0  . erythromycin ophthalmic ointment Reported on 03/05/2015  2  . levothyroxine  (SYNTHROID, LEVOTHROID) 25 MCG tablet TAKE 1 TABLET (25 MCG TOTAL) BY MOUTH DAILY BEFORE BREAKFAST. 30 tablet 5  . neomycin-polymyxin b-dexamethasone (MAXITROL) 3.5-10000-0.1 SUSP Reported on 03/05/2015  1  . oxyCODONE-acetaminophen (ROXICET) 5-325 MG per tablet Take 1 tablet by mouth every 8 (eight) hours as needed for severe pain. (Patient not taking: Reported on 12/09/2014) 20 tablet 0   No facility-administered medications prior to visit.    Allergies:  Allergies  Allergen Reactions  . Ibuprofen Hives  . Naproxen Sodium Hives  . Aspirin Hives  . Latex Rash  . Hydrocodeine [Dihydrocodeine]   . Vicodin [Hydrocodone-Acetaminophen]       Review of Systems: See HPI for pertinent ROS. All other ROS negative.    Physical Exam: Blood pressure 122/72, pulse 92, temperature 98.1 F (36.7 C), temperature source Oral, resp. rate 18, weight 193 lb (87.544 kg)., Body mass index is 30.22 kg/(m^2). General:  WF. Appears in no acute distress. Neck: Supple. No thyromegaly. No lymphadenopathy. Lungs: Clear bilaterally to auscultation without wheezes, rales, or rhonchi. Breathing is unlabored. Heart: Regular rhythm. No murmurs, rubs, or gallops. Msk:  Strength and tone normal for age. No costophrenic angle tenderness with percussion bilaterally. Extremities/Skin: Warm and dry.  Neuro: Alert and oriented X 3. Moves all extremities spontaneously. Gait is normal. CNII-XII grossly in tact. Psych:  Responds to questions  appropriately with a normal affect.     ASSESSMENT AND PLAN:  64 y.o. year old female with  1. Urinary tract infection, site not specified She is to start Cipro immediately take as directed and complete all of it. Follow up if symptoms do not resolve upon completion of antibiotic. As well, will send culture and follow-up culture results.  - ciprofloxacin (CIPRO) 500 MG tablet; Take 1 tablet (500 mg total) by mouth 2 (two) times daily.  Dispense: 10 tablet; Refill: 0 - Urine  culture  2. Urgency of urination - Urinalysis, Routine w reflex microscopic (not at [facility]) - Urine culture   Signed, [facility] [place], [place], [facility] 03/05/2015 3:27 PM

## 2015-03-06 ENCOUNTER — Other Ambulatory Visit: Payer: Medicare Other

## 2015-03-07 LAB — URINE CULTURE
Colony Count: NO GROWTH
Organism ID, Bacteria: NO GROWTH

## 2015-03-12 ENCOUNTER — Ambulatory Visit: Payer: Medicare Other | Admitting: Endocrinology

## 2015-03-12 ENCOUNTER — Telehealth: Payer: Self-pay | Admitting: Endocrinology

## 2015-03-12 NOTE — Telephone Encounter (Signed)
Schedule follow-up appointment, first available 

## 2015-03-12 NOTE — Telephone Encounter (Signed)
Patient no showed today's appt. Please advise on how to follow up. °A. No follow up necessary. °B. Follow up urgent. Contact patient immediately. °C. Follow up necessary. Contact patient and schedule visit in ___ days. °D. Follow up advised. Contact patient and schedule visit in ____weeks. ° °

## 2015-03-20 ENCOUNTER — Other Ambulatory Visit: Payer: Self-pay | Admitting: Physician Assistant

## 2015-03-20 NOTE — Telephone Encounter (Signed)
FYI--- I saw the red notice regarding patient being dismissed--- however discussed this with Larene Beach and she says that she actually just put this in the computer as "a hold" but says that she has not sent pt any paperwork yet, etc.  She has NOT been dismissed. Refill approved -- # [id number] + 1 ONE

## 2015-03-20 NOTE — Telephone Encounter (Signed)
LRF 12/30/14 #45 + 2  LOV 03/05/15  OK refill?

## 2015-03-21 NOTE — Telephone Encounter (Signed)
RX called in .

## 2015-04-01 ENCOUNTER — Other Ambulatory Visit: Payer: Self-pay | Admitting: Family Medicine

## 2015-04-01 NOTE — Telephone Encounter (Signed)
ok 

## 2015-04-01 NOTE — Telephone Encounter (Signed)
rx called in

## 2015-04-01 NOTE — Telephone Encounter (Signed)
Ok to refill??  Last office visit 03/05/2015.  Last refill 01/17/2015, #1 refill.

## 2015-04-09 NOTE — Telephone Encounter (Signed)
LM for pt to call back.

## 2015-04-24 NOTE — Telephone Encounter (Signed)
LM for pt to call to schedule.

## 2015-05-13 ENCOUNTER — Other Ambulatory Visit: Payer: Self-pay | Admitting: Family Medicine

## 2015-05-13 NOTE — Telephone Encounter (Signed)
Medication refilled per protocol. 

## 2015-05-19 ENCOUNTER — Telehealth: Payer: Self-pay | Admitting: Family Medicine

## 2015-05-19 NOTE — Telephone Encounter (Signed)
Patient requesting refill on her xanax [facility] [address]  CB# [phone]

## 2015-05-19 NOTE — Telephone Encounter (Signed)
?   OK to Refill  

## 2015-05-20 MED ORDER — ALPRAZOLAM 0.5 MG PO TABS: 0.5000 mg | ORAL_TABLET | Freq: Two times a day (BID) | ORAL | Status: DC | PRN

## 2015-05-20 NOTE — Telephone Encounter (Signed)
Medication called/sent to requested pharmacy  

## 2015-05-20 NOTE — Telephone Encounter (Signed)
ok 

## 2015-05-21 ENCOUNTER — Encounter: Payer: Self-pay | Admitting: Family Medicine

## 2015-05-21 ENCOUNTER — Ambulatory Visit (INDEPENDENT_AMBULATORY_CARE_PROVIDER_SITE_OTHER): Payer: Medicare Other | Admitting: Family Medicine

## 2015-05-21 VITALS — BP 146/82 | HR 84 | Temp 98.4°F | Resp 18 | Ht 68.0 in | Wt 193.0 lb

## 2015-05-21 DIAGNOSIS — F332 Major depressive disorder, recurrent severe without psychotic features: Secondary | ICD-10-CM

## 2015-05-21 DIAGNOSIS — F411 Generalized anxiety disorder: Secondary | ICD-10-CM

## 2015-05-21 DIAGNOSIS — N390 Urinary tract infection, site not specified: Secondary | ICD-10-CM | POA: Diagnosis not present

## 2015-05-21 DIAGNOSIS — G4700 Insomnia, unspecified: Secondary | ICD-10-CM | POA: Diagnosis not present

## 2015-05-21 DIAGNOSIS — R32 Unspecified urinary incontinence: Secondary | ICD-10-CM

## 2015-05-21 LAB — URINALYSIS, MICROSCOPIC ONLY
Casts: NONE SEEN [LPF]
Crystals: NONE SEEN [HPF]
Yeast: NONE SEEN [HPF]

## 2015-05-21 LAB — URINALYSIS, ROUTINE W REFLEX MICROSCOPIC
Bilirubin Urine: NEGATIVE
Glucose, UA: NEGATIVE
Ketones, ur: NEGATIVE
Nitrite: NEGATIVE
Specific Gravity, Urine: 1.025 (ref 1.001–1.035)
pH: 6.5 (ref 5.0–8.0)

## 2015-05-21 MED ORDER — CEPHALEXIN 500 MG PO CAPS: 500.0000 mg | ORAL_CAPSULE | Freq: Four times a day (QID) | ORAL | Status: DC

## 2015-05-21 MED ORDER — FESOTERODINE FUMARATE ER 4 MG PO TB24: 4.0000 mg | ORAL_TABLET | Freq: Every day | ORAL | Status: DC

## 2015-05-21 MED ORDER — TOLTERODINE TARTRATE 1 MG PO TABS: 1.0000 mg | ORAL_TABLET | Freq: Two times a day (BID) | ORAL | Status: DC

## 2015-05-21 MED ORDER — BUPROPION HCL ER (SR) 150 MG PO TB12: 150.0000 mg | ORAL_TABLET | Freq: Two times a day (BID) | ORAL | Status: DC

## 2015-05-21 MED ORDER — ZOLPIDEM TARTRATE 10 MG PO TABS: ORAL_TABLET | ORAL | Status: DC

## 2015-05-21 MED ORDER — ALPRAZOLAM 0.5 MG PO TABS: 0.5000 mg | ORAL_TABLET | Freq: Two times a day (BID) | ORAL | Status: DC | PRN

## 2015-05-21 NOTE — Patient Instructions (Addendum)
Restart xanax Restart with Wellbutrin  Return for cholesterol  F/U 6 weeks

## 2015-05-21 NOTE — Assessment & Plan Note (Signed)
Anxiety with major depression and now marital conflict where she is separated. Think her memory changes all she does have dementia in her father who passed away in his late 51s I think this is due to depression. We'll start her back on the well pretreatment in addition to the alprazolam. She can also continue her Ambien at that time if needed.

## 2015-05-21 NOTE — Assessment & Plan Note (Signed)
Trial of Detrol, Lisbeth Ply not covered

## 2015-05-21 NOTE — Progress Notes (Signed)
Patient ID: [name] [name], female   DOB: 04-18-1950, 65 y.o.   MRN: [id number]   Subjective:    Patient ID: [name] [name], female    DOB: 01/25/1951, 65 y.o.   MRN: [id number]  Patient presents for Medication Management; Medication Refill; and Urinary Tract Infection  patient for follow-up and to discuss her medications. I personally have not seen her in greater than a year. She comes in with recurrent UTI infections. She was also battling Graves' disease which has effects on her eyes. She does follow with endocrinology she has had ablation to the thyroid  In the past. She's had multiple surgical interventions for her eyes.   she is on Lipitor secondary to hyperlipidemia- her last lipid panel was a year ago   She's currently on Toviazfor  urinary frequency/ overactive bladder- due for refill    He is on Ambien for insomnia as well as alprazolam for anxiety    Dysuria  Started 1 week ago. She was concerned that the bladder infection was causing some polyps with her memory. As we felt deeper into this she is actually now separated from her husband for the past 2-1/2 months. Her father also died a couple weeks after separation from her husband. Then her beloved pet died.  She's been taking her Xanax as needed. She feels like she is in a mental fall. She is not sleeping very well. She states that her husband just wants to stay on his computer all the time he placed an avatar like game all day long it seems to have a relationship for some of the other players on the game. Note she was on Zoloft in the past when she was going to her back breast cancer which helped. I also prescribed Wellbutrin but she did not continue this for smoking after a couple weeks she did not notice a change. She still has Wellbutrin at home   Review Of Systems:  GEN- denies fatigue, fever, weight loss,weakness, recent illness HEENT- denies eye drainage, change in vision, nasal discharge, CVS- denies chest pain,  palpitations RESP- denies SOB, cough, wheeze ABD- denies N/V, change in stools, abd pain GU- denies dysuria, hematuria, dribbling, incontinence MSK- denies joint pain, muscle aches, injury Neuro- denies headache, dizziness, syncope, seizure activity       Objective:    BP 146/82 mmHg  Pulse 84  Temp(Src) 98.4 F (36.9 C) (Oral)  Resp 18  Ht 5\' 8"  (1.727 m)  Wt 193 lb (87.544 kg)  BMI 29.35 kg/m2 GEN- NAD, alert and oriented x3 HEENT- PERRL, EOMI, non injected sclera, pink conjunctiva, MMM, oropharynx clear CVS- RRR, no murmur RESP-CTAB ABD-NABS,soft,NT,ND, no CVA tenderness  Psych- tearful, depressed affect, not anxious, no SI  EXT- No edema Pulses- Radial 2+        Assessment & Plan:      Problem List Items Addressed This Visit    Urinary incontinence    Trial of Detrol, toviaz not covered      Relevant Medications   fesoterodine (TOVIAZ) 4 MG TB24 tablet   tolterodine (DETROL) 1 MG tablet   MDD (major depressive disorder) (HCC)    Recheck in 6 weeks on medications      Relevant Medications   buPROPion (WELLBUTRIN SR) 150 MG 12 hr tablet   ALPRAZolam (XANAX) 0.5 MG tablet   Insomnia   GAD (generalized anxiety disorder)     Anxiety with major depression and now marital conflict where she is separated. Think her memory changes  all she does have dementia in her father who passed away in his late 28s I think this is due to depression. We'll start her back on the well pretreatment in addition to the alprazolam. She can also continue her Ambien at that time if needed.       Other Visit Diagnoses    UTI (lower urinary tract infection)    -  Primary    Culture sent, start Keflex QID    Relevant Medications    cephALEXin (KEFLEX) 500 MG capsule    Other Relevant Orders    Urinalysis, Routine w reflex microscopic (not at [facility]) (Completed)    Urine culture       Note: This dictation was prepared with Dragon dictation along with smaller phrase technology. Any  transcriptional errors that result from this process are unintentional.

## 2015-05-21 NOTE — Assessment & Plan Note (Signed)
Recheck in 6 weeks on medications

## 2015-05-22 ENCOUNTER — Telehealth: Payer: Self-pay | Admitting: *Deleted

## 2015-05-22 NOTE — Telephone Encounter (Signed)
Pt calling stating she was prescribed Detrol 1mg , went to pharmacy and now this one is also expensive for her which will cost her around 100 dollars. Wants to know if there is something else you can prescribe to her that she can afford. Pt is on fixed income. Please advise!

## 2015-05-23 NOTE — Telephone Encounter (Signed)
Called pt back informed her on the message given back by provider and pt stated that it is costing to much and would like to try something OTC, I advised pt to look at [facility] and see which ones would benefit her and to speak to Pharmacist.

## 2015-05-23 NOTE — Telephone Encounter (Signed)
Advise this is what was on her insurance as the lowest tier. She can call her insurance company herself but we have looked through already

## 2015-05-24 LAB — URINE CULTURE

## 2015-05-29 ENCOUNTER — Other Ambulatory Visit: Payer: Medicare Other

## 2015-05-29 ENCOUNTER — Encounter: Payer: Self-pay | Admitting: *Deleted

## 2015-05-29 ENCOUNTER — Other Ambulatory Visit: Payer: Self-pay | Admitting: Family Medicine

## 2015-05-29 DIAGNOSIS — J441 Chronic obstructive pulmonary disease with (acute) exacerbation: Secondary | ICD-10-CM

## 2015-05-29 DIAGNOSIS — E785 Hyperlipidemia, unspecified: Secondary | ICD-10-CM

## 2015-05-29 DIAGNOSIS — K219 Gastro-esophageal reflux disease without esophagitis: Secondary | ICD-10-CM

## 2015-05-29 DIAGNOSIS — E0500 Thyrotoxicosis with diffuse goiter without thyrotoxic crisis or storm: Secondary | ICD-10-CM

## 2015-05-29 LAB — LIPID PANEL
CHOLESTEROL: 156 mg/dL (ref 125–200)
HDL: 41 mg/dL — ABNORMAL LOW (ref >=46)
LDL Cholesterol: 96 mg/dL (ref <130)
Total CHOL/HDL Ratio: 3.8 Ratio (ref <=5.0)
Triglycerides: 93 mg/dL (ref <150)
VLDL: 19 mg/dL (ref <30)

## 2015-05-29 LAB — COMPLETE METABOLIC PANEL WITH GFR
ALT: 12 U/L (ref 6–29)
AST: 16 U/L (ref 10–35)
Albumin: 4 g/dL (ref 3.6–5.1)
Alkaline Phosphatase: 106 U/L (ref 33–130)
BUN: 12 mg/dL (ref 7–25)
CHLORIDE: 100 mmol/L (ref 98–110)
CO2: 26 mmol/L (ref 20–31)
CREATININE: 0.62 mg/dL (ref 0.50–0.99)
Calcium: 8.5 mg/dL — ABNORMAL LOW (ref 8.6–10.4)
GFR, Est African American: >89 mL/min (ref >=60)
GFR, Est Non African American: >89 mL/min (ref >=60)
Glucose, Bld: 124 mg/dL — ABNORMAL HIGH (ref 70–99)
POTASSIUM: 3.8 mmol/L (ref 3.5–5.3)
SODIUM: 140 mmol/L (ref 135–146)
Total Bilirubin: 0.4 mg/dL (ref 0.2–1.2)
Total Protein: 6.9 g/dL (ref 6.1–8.1)

## 2015-05-29 LAB — CBC WITH DIFFERENTIAL/PLATELET
BASOS ABS: 0 10*3/uL (ref 0.0–0.1)
BASOS PCT: 0 % (ref 0–1)
EOS ABS: 0 10*3/uL (ref 0.0–0.7)
EOS PCT: 0 % (ref 0–5)
HEMATOCRIT: 41.1 % (ref 36.0–46.0)
Hemoglobin: 14.1 g/dL (ref 12.0–15.0)
LYMPHS PCT: 20 % (ref 12–46)
Lymphs Abs: 1.4 10*3/uL (ref 0.7–4.0)
MCH: 30.9 pg (ref 26.0–34.0)
MCHC: 34.3 g/dL (ref 30.0–36.0)
MCV: 89.9 fL (ref 78.0–100.0)
MPV: 10.8 fL (ref 8.6–12.4)
Monocytes Absolute: 0.6 10*3/uL (ref 0.1–1.0)
Monocytes Relative: 9 % (ref 3–12)
Neutro Abs: 5.1 10*3/uL (ref 1.7–7.7)
Neutrophils Relative %: 71 % (ref 43–77)
PLATELETS: 227 10*3/uL (ref 150–400)
RBC: 4.57 MIL/uL (ref 3.87–5.11)
RDW: 14.6 % (ref 11.5–15.5)
WBC: 7.2 10*3/uL (ref 4.0–10.5)

## 2015-05-29 LAB — TSH: TSH: 1.4 m[IU]/L

## 2015-05-29 NOTE — Telephone Encounter (Signed)
Pt would like a refill on Ambien 

## 2015-05-30 NOTE — Telephone Encounter (Signed)
This encounter was created in error - please disregard.

## 2015-05-31 LAB — HEMOGLOBIN A1C
HEMOGLOBIN A1C: 5.9 % — AB (ref <5.7)
MEAN PLASMA GLUCOSE: 123 mg/dL — AB (ref <117)

## 2015-06-11 ENCOUNTER — Encounter: Payer: Self-pay | Admitting: Family Medicine

## 2015-06-17 ENCOUNTER — Other Ambulatory Visit: Payer: Self-pay | Admitting: Family Medicine

## 2015-06-17 NOTE — Telephone Encounter (Signed)
Refill appropriate and filled per protocol. 

## 2015-06-25 ENCOUNTER — Telehealth: Payer: Self-pay | Admitting: Endocrinology

## 2015-06-25 ENCOUNTER — Other Ambulatory Visit: Payer: Self-pay | Admitting: *Deleted

## 2015-06-25 ENCOUNTER — Other Ambulatory Visit: Payer: Self-pay | Admitting: Endocrinology

## 2015-06-25 MED ORDER — LEVOTHYROXINE SODIUM 25 MCG PO TABS: 25.0000 ug | ORAL_TABLET | Freq: Every day | ORAL | Status: DC

## 2015-06-25 NOTE — Telephone Encounter (Signed)
ok 

## 2015-06-25 NOTE — Telephone Encounter (Signed)
Patient need a refill of levothyroxine (LEVOTHROID) 25 MCG tablet send to   [id number] [place], [place] - [address] [phone] (Phone) [phone] (Fax)

## 2015-06-25 NOTE — Telephone Encounter (Signed)
Patient was last seen in September, she no showed her last appointment, I had denied this earlier. Please advise if you still want it sent in?

## 2015-06-25 NOTE — Telephone Encounter (Signed)
She is scheduled for June which is okay

## 2015-06-25 NOTE — Telephone Encounter (Signed)
Okay, the appointment was not scheduled when it was denied. rx sent

## 2015-07-02 ENCOUNTER — Ambulatory Visit: Payer: Medicare Other | Admitting: Family Medicine

## 2015-07-08 ENCOUNTER — Encounter: Payer: Self-pay | Admitting: Family Medicine

## 2015-07-29 ENCOUNTER — Telehealth: Payer: Self-pay | Admitting: Family Medicine

## 2015-07-29 NOTE — Telephone Encounter (Signed)
Please clarify exactly what patient is requesting.

## 2015-07-29 NOTE — Telephone Encounter (Signed)
Patient would like to talk to you regarding her prescription drug plan, and needs some help with this  [phone]

## 2015-08-13 ENCOUNTER — Encounter: Payer: Self-pay | Admitting: Family Medicine

## 2015-08-18 ENCOUNTER — Other Ambulatory Visit: Payer: Medicare Other

## 2015-08-21 ENCOUNTER — Ambulatory Visit: Payer: Medicare Other | Admitting: Endocrinology

## 2015-08-21 DIAGNOSIS — Z0289 Encounter for other administrative examinations: Secondary | ICD-10-CM

## 2015-09-05 ENCOUNTER — Encounter: Payer: Self-pay | Admitting: Family Medicine

## 2016-07-14 ENCOUNTER — Other Ambulatory Visit: Payer: Self-pay | Admitting: Endocrinology

## 2016-08-02 ENCOUNTER — Encounter: Payer: Self-pay | Admitting: Family Medicine

## 2016-08-02 ENCOUNTER — Ambulatory Visit (INDEPENDENT_AMBULATORY_CARE_PROVIDER_SITE_OTHER): Payer: Medicare PPO | Admitting: Family Medicine

## 2016-08-02 ENCOUNTER — Encounter (INDEPENDENT_AMBULATORY_CARE_PROVIDER_SITE_OTHER): Payer: Self-pay | Admitting: *Deleted

## 2016-08-02 ENCOUNTER — Other Ambulatory Visit: Payer: Self-pay | Admitting: Family Medicine

## 2016-08-02 ENCOUNTER — Ambulatory Visit
Admission: RE | Admit: 2016-08-02 | Discharge: 2016-08-02 | Disposition: A | Discharge status: Home / Self Care | Payer: Medicare PPO | Source: Ambulatory Visit | Attending: Family Medicine | Admitting: Family Medicine

## 2016-08-02 VITALS — BP 142/80 | HR 78 | Temp 97.8°F | Resp 18 | Ht 68.0 in | Wt 204.0 lb

## 2016-08-02 DIAGNOSIS — R7303 Prediabetes: Secondary | ICD-10-CM | POA: Diagnosis not present

## 2016-08-02 DIAGNOSIS — R351 Nocturia: Secondary | ICD-10-CM

## 2016-08-02 DIAGNOSIS — Z1159 Encounter for screening for other viral diseases: Secondary | ICD-10-CM

## 2016-08-02 DIAGNOSIS — Z0000 Encounter for general adult medical examination without abnormal findings: Secondary | ICD-10-CM | POA: Diagnosis not present

## 2016-08-02 DIAGNOSIS — J410 Simple chronic bronchitis: Secondary | ICD-10-CM | POA: Diagnosis not present

## 2016-08-02 DIAGNOSIS — Z1239 Encounter for other screening for malignant neoplasm of breast: Secondary | ICD-10-CM

## 2016-08-02 DIAGNOSIS — F17200 Nicotine dependence, unspecified, uncomplicated: Secondary | ICD-10-CM

## 2016-08-02 DIAGNOSIS — M545 Low back pain, unspecified: Secondary | ICD-10-CM

## 2016-08-02 DIAGNOSIS — E0500 Thyrotoxicosis with diffuse goiter without thyrotoxic crisis or storm: Secondary | ICD-10-CM

## 2016-08-02 DIAGNOSIS — Z780 Asymptomatic menopausal state: Secondary | ICD-10-CM

## 2016-08-02 DIAGNOSIS — Z23 Encounter for immunization: Secondary | ICD-10-CM | POA: Diagnosis not present

## 2016-08-02 DIAGNOSIS — G8929 Other chronic pain: Secondary | ICD-10-CM

## 2016-08-02 DIAGNOSIS — F5101 Primary insomnia: Secondary | ICD-10-CM | POA: Diagnosis not present

## 2016-08-02 DIAGNOSIS — Z96642 Presence of left artificial hip joint: Secondary | ICD-10-CM | POA: Diagnosis not present

## 2016-08-02 DIAGNOSIS — Z1231 Encounter for screening mammogram for malignant neoplasm of breast: Secondary | ICD-10-CM

## 2016-08-02 DIAGNOSIS — Z13820 Encounter for screening for osteoporosis: Secondary | ICD-10-CM

## 2016-08-02 DIAGNOSIS — N3946 Mixed incontinence: Secondary | ICD-10-CM

## 2016-08-02 DIAGNOSIS — E782 Mixed hyperlipidemia: Secondary | ICD-10-CM | POA: Diagnosis not present

## 2016-08-02 DIAGNOSIS — Z1211 Encounter for screening for malignant neoplasm of colon: Secondary | ICD-10-CM

## 2016-08-02 DIAGNOSIS — E2839 Other primary ovarian failure: Secondary | ICD-10-CM

## 2016-08-02 LAB — CBC WITH DIFFERENTIAL/PLATELET
BASOS ABS: 0 {cells}/uL (ref 0–200)
Basophils Relative: 0 %
Eosinophils Absolute: 0 cells/uL — ABNORMAL LOW (ref 15–500)
Eosinophils Relative: 0 %
HEMATOCRIT: 43 % (ref 35.0–45.0)
HEMOGLOBIN: 13.9 g/dL (ref 12.0–15.0)
LYMPHS ABS: 1736 {cells}/uL (ref 850–3900)
LYMPHS PCT: 28 %
MCH: 28.7 pg (ref 27.0–33.0)
MCHC: 32.3 g/dL (ref 32.0–36.0)
MCV: 88.7 fL (ref 80.0–100.0)
MONO ABS: 682 {cells}/uL (ref 200–950)
MPV: 10.5 fL (ref 7.5–12.5)
Monocytes Relative: 11 %
NEUTROS PCT: 61 %
Neutro Abs: 3782 cells/uL (ref 1500–7800)
Platelets: 263 10*3/uL (ref 140–400)
RBC: 4.85 MIL/uL (ref 3.80–5.10)
RDW: 14 % (ref 11.0–15.0)
WBC: 6.2 10*3/uL (ref 3.8–10.8)

## 2016-08-02 LAB — URINALYSIS, ROUTINE W REFLEX MICROSCOPIC
BILIRUBIN URINE: NEGATIVE
GLUCOSE, UA: NEGATIVE
Hgb urine dipstick: NEGATIVE
Ketones, ur: NEGATIVE
NITRITE: POSITIVE — AB
PH: 6 (ref 5.0–8.0)
Protein, ur: NEGATIVE
Specific Gravity, Urine: 1.02 (ref 1.001–1.035)

## 2016-08-02 LAB — URINALYSIS, MICROSCOPIC ONLY
Casts: NONE SEEN [LPF]
Crystals: NONE SEEN [HPF]
RBC / HPF: NONE SEEN RBC/HPF (ref <=2)
Yeast: NONE SEEN [HPF]

## 2016-08-02 MED ORDER — ALBUTEROL SULFATE HFA 108 (90 BASE) MCG/ACT IN AERS: 2.0000 | INHALATION_SPRAY | Freq: Four times a day (QID) | RESPIRATORY_TRACT | 1 refills | Status: DC | PRN

## 2016-08-02 MED ORDER — CYCLOBENZAPRINE HCL 10 MG PO TABS: 10.0000 mg | ORAL_TABLET | Freq: Three times a day (TID) | ORAL | 0 refills | Status: DC | PRN

## 2016-08-02 MED ORDER — FESOTERODINE FUMARATE ER 4 MG PO TB24: 4.0000 mg | ORAL_TABLET | Freq: Every day | ORAL | 4 refills | Status: DC

## 2016-08-02 MED ORDER — DEXLANSOPRAZOLE 60 MG PO CPDR: 60.0000 mg | DELAYED_RELEASE_CAPSULE | Freq: Every day | ORAL | 2 refills | Status: DC

## 2016-08-02 MED ORDER — ZOLPIDEM TARTRATE 10 MG PO TABS: ORAL_TABLET | ORAL | 1 refills | Status: DC

## 2016-08-02 NOTE — Progress Notes (Signed)
Subjective:   Patient presents for Medicare Annual/Subsequent preventive examination.     Lost insurance has only been taking synthroid, last visit March 2017     Borderline DM/Hyperliidemia    has been off medication, eats late at night, eats at dinners once a month, states she has been trying to avoid snacks/carbs. But admits tp drinking SODA for breakfast    Weight gain 11lbs in past year    Back pain- low back pain for  Past few months , when she stands for > " 5 minutes" gets excruiating pain, sitting has no pain. Some pain with walking. Legs feel heavy no tingling an dnumbness Denies any UTI symptoms Has history of Hip replacement- Left   Graves disease- has had 3 eye procedures since our last visit, pain has improved in eye , due for repeat TFT   Review Past Medical/Family/Social: Per EMR   Risk Factors  Current exercise habits: none  Dietary issues discussed:  Yes  Cardiac risk factors: Obesity (BMI >= 30 kg/m2). Hyperlipidemia, borderline DM, obesity   Depression Screen  (Note: if answer to either of the following is "Yes", a more complete depression screening is indicated)  Over the past two weeks, have you felt down, depressed or hopeless? No Over the past two weeks, have you felt little interest or pleasure in doing things? No Have you lost interest or pleasure in daily life? No Do you often feel hopeless? No Do you cry easily over simple problems? No   Activities of Daily Living  In your present state of health, do you have any difficulty performing the following activities?:  Driving? No  Managing money? No  Feeding yourself? No  Getting from bed to chair? No  Climbing a flight of stairs? No  Preparing food and eating?: No  Bathing or showering? No  Getting dressed: No  Getting to the toilet? No  Using the toilet:No  Moving around from place to place: No  In the past year have you fallen or had a near fall?:No  Are you sexually active? No  Do you  have more than one partner? No   Hearing Difficulties: No  Do you often ask people to speak up or repeat themselves? No  Do you experience ringing or noises in your ears? No Do you have difficulty understanding soft or whispered voices? No  Do you feel that you have a problem with memory? No Do you often misplace items? No  Do you feel safe at home? Yes  Cognitive Testing  Alert? Yes Normal Appearance?Yes  Oriented to person? Yes Place? Yes  Time? Yes  Recall of three objects? Yes  Can perform simple calculations? Yes  Displays appropriate judgment?Yes  Can read the correct time from a watch face?Yes   List the Names of Other Physician/Practitioners you currently use:    Indicate any recent Medical Services you may have received from other than [facility] providers in the past year (date may be approximate).   Screening Tests / Date Colonoscopy - Due for colonoscopy- [place]                    Zostavax  Mammogram - Due  Bone Density- Due  Tetanus/tdap- UTD Pneumonia- Due for Prevar    Assessment:    Annual wellness medicare exam   Plan:    During the course of the visit the patient was educated and counseled about appropriate screening and preventive services including:  1. Prevention: Screening mammography  Colorectal  cancer screening  Bone Density screening Prevnar 13 vaccine given  Hep C screening   2. Check her fasting labs for her borderline diabetes as well as hyperlipidemia. Discussed dietary changes acutely made to also help with her weight loss.  3. Screen neg  for depression. PHQ- 9 score of 4. She was on antidepressants in the past as well as alprazolam she states that she is doing well does not want to return to any medications at this time. 4. Back pain- more mSK Pain but pain into her replaced hip as well, obtain xray to make sure stable hardware, given flexeril, can also use tylenol  5. Mixed incontinence and overactive bladder- restart Toviaz   6  Graves disease, thyroid dysfunction- recheck TFT, has not re-established with her endocrinologist   Diet review for nutrition referral? Yes ____ Not Indicated __x__  Patient Instructions (the written plan) was given to the patient.  Medicare Attestation  I have personally reviewed:  The patient's medical and social history  Their use of alcohol, tobacco or illicit drugs  Their current medications and supplements  The patient's functional ability including ADLs,fall risks, home safety risks, cognitive, and hearing and visual impairment  Diet and physical activities  Evidence for depression or mood disorders  The patient's weight, height, BMI, and visual acuity have been recorded in the chart. I have made referrals, counseling, and provided education to the patient based on review of the above and I have provided the patient with a written personalized care plan for preventive services.

## 2016-08-02 NOTE — Patient Instructions (Addendum)
Prevnar 13 given  Restart [employer] the xrays at  [place] FLEXERIL/TYLENOL as needed for back pain Schedule Mammogram/Bone Density  Referral for colonoscopy We will call with results F/U 6 weeks

## 2016-08-02 NOTE — Addendum Note (Signed)
Addended by: Vic Blackbird F on: 08/02/2016 01:51 PM   Modules accepted: Orders

## 2016-08-03 LAB — COMPREHENSIVE METABOLIC PANEL
ALBUMIN: 4.3 g/dL (ref 3.6–5.1)
ALT: 25 U/L (ref 6–29)
AST: 18 U/L (ref 10–35)
Alkaline Phosphatase: 101 U/L (ref 33–130)
BILIRUBIN TOTAL: 0.3 mg/dL (ref 0.2–1.2)
BUN: 12 mg/dL (ref 7–25)
CALCIUM: 9 mg/dL (ref 8.6–10.4)
CHLORIDE: 105 mmol/L (ref 98–110)
CO2: 23 mmol/L (ref 20–31)
Creat: 0.57 mg/dL (ref 0.50–0.99)
GLUCOSE: 95 mg/dL (ref 70–99)
POTASSIUM: 4.3 mmol/L (ref 3.5–5.3)
Sodium: 140 mmol/L (ref 135–146)
Total Protein: 6.9 g/dL (ref 6.1–8.1)

## 2016-08-03 LAB — LIPID PANEL
CHOL/HDL RATIO: 5.8 ratio — AB (ref <5.0)
Cholesterol: 228 mg/dL — ABNORMAL HIGH (ref <200)
HDL: 39 mg/dL — AB (ref >50)
LDL Cholesterol: 152 mg/dL — ABNORMAL HIGH (ref <100)
Triglycerides: 184 mg/dL — ABNORMAL HIGH (ref <150)
VLDL: 37 mg/dL — AB (ref <30)

## 2016-08-03 LAB — HEMOGLOBIN A1C
Hgb A1c MFr Bld: 5.9 % — ABNORMAL HIGH (ref <5.7)
MEAN PLASMA GLUCOSE: 123 mg/dL

## 2016-08-03 LAB — T3, FREE: T3 FREE: 4 pg/mL (ref 2.3–4.2)

## 2016-08-03 LAB — TSH: TSH: 1.38 mIU/L

## 2016-08-03 LAB — T4, FREE: Free T4: 1 ng/dL (ref 0.8–1.8)

## 2016-08-03 LAB — HEPATITIS C ANTIBODY: HCV AB: NEGATIVE

## 2016-08-05 ENCOUNTER — Other Ambulatory Visit: Payer: Self-pay | Admitting: *Deleted

## 2016-08-05 LAB — URINE CULTURE

## 2016-08-05 MED ORDER — CEPHALEXIN 500 MG PO CAPS: 500.0000 mg | ORAL_CAPSULE | Freq: Four times a day (QID) | ORAL | 0 refills | Status: DC

## 2016-08-05 MED ORDER — ATORVASTATIN CALCIUM 20 MG PO TABS: 20.0000 mg | ORAL_TABLET | Freq: Every day | ORAL | 3 refills | Status: DC

## 2016-08-11 ENCOUNTER — Other Ambulatory Visit (INDEPENDENT_AMBULATORY_CARE_PROVIDER_SITE_OTHER): Payer: Self-pay | Admitting: *Deleted

## 2016-08-11 DIAGNOSIS — Z1211 Encounter for screening for malignant neoplasm of colon: Secondary | ICD-10-CM | POA: Insufficient documentation

## 2016-08-17 ENCOUNTER — Ambulatory Visit: Payer: Medicare PPO

## 2016-08-17 ENCOUNTER — Other Ambulatory Visit: Payer: Medicare PPO

## 2016-08-19 ENCOUNTER — Ambulatory Visit
Admission: RE | Admit: 2016-08-19 | Discharge: 2016-08-19 | Disposition: A | Discharge status: Home / Self Care | Payer: Medicare PPO | Source: Ambulatory Visit | Attending: Family Medicine | Admitting: Family Medicine

## 2016-08-19 DIAGNOSIS — Z1239 Encounter for other screening for malignant neoplasm of breast: Secondary | ICD-10-CM

## 2016-08-19 DIAGNOSIS — E2839 Other primary ovarian failure: Secondary | ICD-10-CM

## 2016-08-20 ENCOUNTER — Encounter: Payer: Self-pay | Admitting: *Deleted

## 2016-08-27 ENCOUNTER — Telehealth: Payer: Self-pay | Admitting: Family Medicine

## 2016-08-27 NOTE — Telephone Encounter (Signed)
Spoke with patient about results.

## 2016-08-27 NOTE — Telephone Encounter (Signed)
Patient calling to get her xray results [phone] (H)

## 2016-09-13 ENCOUNTER — Encounter: Payer: Self-pay | Admitting: Family Medicine

## 2016-09-13 ENCOUNTER — Ambulatory Visit (INDEPENDENT_AMBULATORY_CARE_PROVIDER_SITE_OTHER): Payer: Medicare PPO | Admitting: Family Medicine

## 2016-09-13 VITALS — BP 140/78 | HR 90 | Temp 98.3°F | Resp 14 | Ht 68.0 in | Wt 204.0 lb

## 2016-09-13 DIAGNOSIS — F17200 Nicotine dependence, unspecified, uncomplicated: Secondary | ICD-10-CM

## 2016-09-13 DIAGNOSIS — F5101 Primary insomnia: Secondary | ICD-10-CM | POA: Diagnosis not present

## 2016-09-13 DIAGNOSIS — J410 Simple chronic bronchitis: Secondary | ICD-10-CM | POA: Diagnosis not present

## 2016-09-13 DIAGNOSIS — N3946 Mixed incontinence: Secondary | ICD-10-CM

## 2016-09-13 DIAGNOSIS — M1612 Unilateral primary osteoarthritis, left hip: Secondary | ICD-10-CM

## 2016-09-13 DIAGNOSIS — M8589 Other specified disorders of bone density and structure, multiple sites: Secondary | ICD-10-CM

## 2016-09-13 DIAGNOSIS — M8580 Other specified disorders of bone density and structure, unspecified site: Secondary | ICD-10-CM | POA: Insufficient documentation

## 2016-09-13 MED ORDER — DEXLANSOPRAZOLE 60 MG PO CPDR
60.0000 mg | DELAYED_RELEASE_CAPSULE | Freq: Every day | ORAL | 2 refills | Status: DC
Start: 2016-09-13 — End: 2016-12-15

## 2016-09-13 MED ORDER — TIZANIDINE HCL 4 MG PO TABS: 4.0000 mg | ORAL_TABLET | Freq: Four times a day (QID) | ORAL | 0 refills | Status: DC | PRN

## 2016-09-13 MED ORDER — CYCLOBENZAPRINE HCL 10 MG PO TABS: 10.0000 mg | ORAL_TABLET | Freq: Three times a day (TID) | ORAL | 1 refills | Status: DC | PRN

## 2016-09-13 MED ORDER — ZOLPIDEM TARTRATE 10 MG PO TABS: ORAL_TABLET | ORAL | 1 refills | Status: DC

## 2016-09-13 MED ORDER — TRAMADOL HCL 50 MG PO TABS: 50.0000 mg | ORAL_TABLET | Freq: Three times a day (TID) | ORAL | 0 refills | Status: DC | PRN

## 2016-09-13 MED ORDER — VARENICLINE TARTRATE 0.5 MG X 11 & 1 MG X 42 PO MISC: ORAL | 0 refills | Status: DC

## 2016-09-13 MED ORDER — LEVOTHYROXINE SODIUM 25 MCG PO TABS: 25.0000 ug | ORAL_TABLET | Freq: Every day | ORAL | 6 refills | Status: DC

## 2016-09-13 NOTE — Assessment & Plan Note (Signed)
Continue ambien

## 2016-09-13 NOTE — Assessment & Plan Note (Signed)
Continue calcium vitamin-D

## 2016-09-13 NOTE — Assessment & Plan Note (Signed)
Improved on toviaz

## 2016-09-13 NOTE — Progress Notes (Signed)
   Subjective:    Patient ID: [name] [name], female    DOB: 09-11-1950, 66 y.o.   MRN: [id number]  Patient presents for Follow-up (is not fasting)  Patient here for follow-up. She was seen about 6 weeks ago that time for physical exam she had lost her insurance was reestablishing her care getting back to her medications. #1 she has chronic insomnia as well as anxiety and depression she came off her antidepressants she did not want to return to the medications. She was still using Ambien.  Overactive bladder/with mixed incontinence she was restarted on Toviaz   Chronic Hip/Backpain  she was sent for x-ray because of increased back pain her hip showed hte previous arthroplasty.Lumbar spine wasnormal.  She was using acetaminophen for pain as well as Flexeril.   She was also referred for colonoscopy which is supposed to be done this fall.  Hyperthyroidism her thyroid function studies were baseline she was still taking her thyroid medication no dose change was made.  Hyperlipidemia she was started on cholesterol medication Lipitor. Total cholesterol 228 LDL 152 triglycerides also elevated with a low HDL  Bone density was also done which showed osteopenia  Graves disease/lazy eye- has appt with Dr. Colin Mulders, at [facility] for next eye surgery  Review Of Systems:  GEN- denies fatigue, fever, weight loss,weakness, recent illness HEENT- denies eye drainage, change in vision, nasal discharge, CVS- denies chest pain, palpitations RESP- denies SOB, cough, wheeze ABD- denies N/V, change in stools, abd pain GU- denies dysuria, hematuria, dribbling, incontinence MSK- + joint pain, muscle aches, injury Neuro- denies headache, dizziness, syncope, seizure activity       Objective:    BP 140/78   Pulse 90   Temp 98.3 F (36.8 C) (Oral)   Resp 14   Ht 5\' 8"  (1.727 m)   Wt 204 lb (92.5 kg)   SpO2 98%   BMI 31.02 kg/m  GEN- NAD, alert and oriented x3 HEENT- PERRL, EOMI, non injected sclera, pink  conjunctiva, MMM, oropharynx clear Neck- Supple, no thyromegaly CVS- RRR, no murmur RESP-scattered wheeze, normal WOB EXT- No edema Pulses- Radial 2+        Assessment & Plan:      Problem List Items Addressed This Visit    TOBACCO ABUSE   Urinary incontinence    Improved on toviaz      Osteopenia    Continue calcium/vitamin D      OA (osteoarthritis) of hip    Given Ultram, refilled flexeril for her back/leg spasms      Relevant Medications   traMADol (ULTRAM) 50 MG tablet   cyclobenzaprine (FLEXERIL) 10 MG tablet   Insomnia - Primary    Continue ambien      COPD (chronic obstructive pulmonary disease) (HCC)    Discussed need for tobacco cessation Trial of chantix      Relevant Medications   varenicline (CHANTIX STARTING MONTH PAK) 0.5 MG X 11 & 1 MG X 42 tablet      Note: This dictation was prepared with Dragon dictation along with smaller phrase technology. Any transcriptional errors that result from this process are unintentional.

## 2016-09-13 NOTE — Assessment & Plan Note (Signed)
Given Ultram, refilled flexeril for her back/leg spasms

## 2016-09-13 NOTE — Assessment & Plan Note (Signed)
Discussed need for tobacco cessation Trial of chantix

## 2016-09-13 NOTE — Patient Instructions (Addendum)
F/U 4 months  Try chantix for smoking  Ultram for pain

## 2016-09-13 NOTE — Addendum Note (Signed)
Addended by: Sheral Flow on: 09/13/2016 04:38 PM   Modules accepted: Orders

## 2016-10-08 ENCOUNTER — Other Ambulatory Visit: Payer: Self-pay | Admitting: *Deleted

## 2016-10-08 MED ORDER — ATORVASTATIN CALCIUM 20 MG PO TABS
20.0000 mg | ORAL_TABLET | Freq: Every day | ORAL | 3 refills | Status: DC
Start: 2016-10-08 — End: 2016-12-15

## 2016-10-15 ENCOUNTER — Ambulatory Visit (INDEPENDENT_AMBULATORY_CARE_PROVIDER_SITE_OTHER): Payer: Medicare PPO | Admitting: Family Medicine

## 2016-10-15 ENCOUNTER — Encounter: Payer: Self-pay | Admitting: Family Medicine

## 2016-10-15 VITALS — BP 130/86 | HR 92 | Resp 18 | Wt 203.0 lb

## 2016-10-15 DIAGNOSIS — K219 Gastro-esophageal reflux disease without esophagitis: Secondary | ICD-10-CM

## 2016-10-15 DIAGNOSIS — E0500 Thyrotoxicosis with diffuse goiter without thyrotoxic crisis or storm: Secondary | ICD-10-CM

## 2016-10-15 DIAGNOSIS — H05823 Myopathy of extraocular muscles, bilateral: Secondary | ICD-10-CM

## 2016-10-15 MED ORDER — TRAMADOL HCL 50 MG PO TABS: 50.0000 mg | ORAL_TABLET | Freq: Three times a day (TID) | ORAL | 0 refills | Status: DC | PRN

## 2016-10-15 NOTE — Patient Instructions (Signed)
F/U as previous 

## 2016-10-15 NOTE — Progress Notes (Signed)
   Subjective:    Patient ID: [name] [name], female    DOB: March 09, 1951, 66 y.o.   MRN: [id number]  Patient presents for Acute Visit (discuss eye issues)  Has graves disease affecting her eyes, told to come here to get a test by her neurologiost- I did NOT receive any specific orders or correspondence about this   Reviewed Care Everywhere concern for Myasthenia Gravis on top of Graves  Needs Anti-ACH receptor antibody testing- reviewed care everywhere chart   Unable to get the Chantix due to cost Also could not get dexilant but helped for her GERD, think she may be in donut hole but has not checked with insurance or pharmacy yet     Review Of Systems:  GEN- denies fatigue, fever, weight loss,weakness, recent illness HEENT- denies eye drainage, +change in vision, nasal discharge, CVS- denies chest pain, palpitations RESP- denies SOB, cough, wheeze ABD- denies N/V, change in stools, abd pain GU- denies dysuria, hematuria, dribbling, incontinence MSK- denies joint pain, muscle aches, injury Neuro- denies headache, dizziness, syncope, seizure activity       Objective:    BP 130/86   Pulse 92   Resp 18   Wt 203 lb (92.1 kg)   SpO2 97%   BMI 30.87 kg/m  GEN- NAD, alert and oriented x3 HEENT- PERRL, EOMI, mild bulge inbilat eyes(chronic) non injected sclera, pink conjunctiva, MMM, oropharynx clear Neck- Supple,  CVS- RRR, no murmur RESP-CTAB Pulses- Radial- 2+        Assessment & Plan:      Problem List Items Addressed This Visit    Graves' eye disease   Relevant Orders   Myasthenia gravis panel 2   GERD (gastroesophageal reflux disease) - Primary    Check with pharmacy on donut hole situation Given samples today  If needed we can change her to omeprazole which is cheapest OTC       Other Visit Diagnoses    Eye muscle weakness, bilateral       Relevant Orders   Myasthenia gravis panel 2      Note: This dictation was prepared with Dragon dictation along  with smaller phrase technology. Any transcriptional errors that result from this process are unintentional.

## 2016-10-17 ENCOUNTER — Encounter: Payer: Self-pay | Admitting: Family Medicine

## 2016-10-17 NOTE — Assessment & Plan Note (Signed)
Check with pharmacy on donut hole situation Given samples today  If needed we can change her to omeprazole which is cheapest OTC

## 2016-10-21 LAB — MYASTHENIA GRAVIS PANEL 2
ACETYLCHOLINE REC MOD AB: 23 %{inhibition}
Acetylcholine Rec Binding: <0.3 nmol/L
Aceytlcholine Rec Bloc Ab: <15 % Inhibition (ref <15)

## 2016-11-02 ENCOUNTER — Other Ambulatory Visit: Payer: Self-pay | Admitting: Family Medicine

## 2016-11-02 NOTE — Telephone Encounter (Signed)
Ok to refill 

## 2016-11-02 NOTE — Telephone Encounter (Signed)
okay

## 2016-11-03 NOTE — Telephone Encounter (Signed)
Prescription sent to pharmacy.

## 2016-11-18 ENCOUNTER — Encounter (INDEPENDENT_AMBULATORY_CARE_PROVIDER_SITE_OTHER): Payer: Self-pay | Admitting: *Deleted

## 2016-11-18 ENCOUNTER — Telehealth (INDEPENDENT_AMBULATORY_CARE_PROVIDER_SITE_OTHER): Payer: Self-pay | Admitting: *Deleted

## 2016-11-18 MED ORDER — PEG 3350-KCL-NA BICARB-NACL 420 G PO SOLR: 4000.0000 mL | Freq: Once | ORAL | 0 refills | Status: AC

## 2016-11-18 NOTE — Telephone Encounter (Signed)
agree

## 2016-11-18 NOTE — Telephone Encounter (Signed)
Referring MD/PCP: [place]   Procedure: tcs  Reason/Indication:  screening  Has patient had this procedure before?  Yes, more than 10 yrs ago  If so, when, by whom and where?    Is there a family history of colon cancer?  no  Who?  What age when diagnosed?    Is patient diabetic?   no      Does patient have prosthetic heart valve or mechanical valve?  no  Do you have a pacemaker?  no  Has patient ever had endocarditis? no  Has patient had joint replacement within last 12 months?  no  Does patient tend to be constipated or take laxatives? no  Does patient have a history of alcohol/drug use?  no  Is patient on Coumadin, Plavix and/or Aspirin? no  Medications: levothyroxine 25 mg daily, atorvastatin 20 mg daily, ambien 10 mg daily  Allergies: see epic  Medication Adjustment per Dr Laural Golden:   Procedure date & time: 12/15/16 at 930

## 2016-11-18 NOTE — Telephone Encounter (Signed)
Patient needs trilyte 

## 2016-11-30 ENCOUNTER — Other Ambulatory Visit: Payer: Self-pay | Admitting: Family Medicine

## 2016-11-30 NOTE — Telephone Encounter (Signed)
Okay to refill? 

## 2016-11-30 NOTE — Telephone Encounter (Signed)
Ok to refill??  Last office visit 10/15/2016.   Last refill 09/13/2016, #1 refill.

## 2016-12-01 NOTE — Telephone Encounter (Signed)
Medication called to pharmacy. 

## 2016-12-15 ENCOUNTER — Encounter (HOSPITAL_COMMUNITY)
Admission: RE | Disposition: A | Discharge status: Home / Self Care | Payer: Self-pay | Source: Ambulatory Visit | Attending: Internal Medicine

## 2016-12-15 ENCOUNTER — Ambulatory Visit (HOSPITAL_COMMUNITY)
Admission: RE | Admit: 2016-12-15 | Discharge: 2016-12-15 | Disposition: A | Discharge status: Home / Self Care | Payer: Medicare PPO | Source: Ambulatory Visit | Attending: Internal Medicine | Admitting: Internal Medicine

## 2016-12-15 ENCOUNTER — Encounter (HOSPITAL_COMMUNITY): Payer: Self-pay | Admitting: *Deleted

## 2016-12-15 DIAGNOSIS — D127 Benign neoplasm of rectosigmoid junction: Secondary | ICD-10-CM

## 2016-12-15 DIAGNOSIS — Z79899 Other long term (current) drug therapy: Secondary | ICD-10-CM | POA: Diagnosis not present

## 2016-12-15 DIAGNOSIS — Z8541 Personal history of malignant neoplasm of cervix uteri: Secondary | ICD-10-CM | POA: Insufficient documentation

## 2016-12-15 DIAGNOSIS — D120 Benign neoplasm of cecum: Secondary | ICD-10-CM | POA: Insufficient documentation

## 2016-12-15 DIAGNOSIS — D122 Benign neoplasm of ascending colon: Secondary | ICD-10-CM | POA: Insufficient documentation

## 2016-12-15 DIAGNOSIS — F329 Major depressive disorder, single episode, unspecified: Secondary | ICD-10-CM | POA: Diagnosis not present

## 2016-12-15 DIAGNOSIS — J449 Chronic obstructive pulmonary disease, unspecified: Secondary | ICD-10-CM | POA: Insufficient documentation

## 2016-12-15 DIAGNOSIS — K644 Residual hemorrhoidal skin tags: Secondary | ICD-10-CM

## 2016-12-15 DIAGNOSIS — Z1211 Encounter for screening for malignant neoplasm of colon: Secondary | ICD-10-CM | POA: Insufficient documentation

## 2016-12-15 DIAGNOSIS — Z96642 Presence of left artificial hip joint: Secondary | ICD-10-CM | POA: Diagnosis not present

## 2016-12-15 DIAGNOSIS — Z86000 Personal history of in-situ neoplasm of breast: Secondary | ICD-10-CM | POA: Diagnosis not present

## 2016-12-15 DIAGNOSIS — E039 Hypothyroidism, unspecified: Secondary | ICD-10-CM | POA: Diagnosis not present

## 2016-12-15 DIAGNOSIS — D123 Benign neoplasm of transverse colon: Secondary | ICD-10-CM | POA: Insufficient documentation

## 2016-12-15 DIAGNOSIS — K635 Polyp of colon: Secondary | ICD-10-CM | POA: Diagnosis not present

## 2016-12-15 DIAGNOSIS — F419 Anxiety disorder, unspecified: Secondary | ICD-10-CM | POA: Diagnosis not present

## 2016-12-15 DIAGNOSIS — D125 Benign neoplasm of sigmoid colon: Secondary | ICD-10-CM | POA: Diagnosis not present

## 2016-12-15 HISTORY — DX: Hypothyroidism, unspecified: E03.9

## 2016-12-15 HISTORY — PX: POLYPECTOMY: SHX5525

## 2016-12-15 HISTORY — PX: COLONOSCOPY: SHX5424

## 2016-12-15 SURGERY — COLONOSCOPY
Anesthesia: Moderate Sedation

## 2016-12-15 MED ORDER — MEPERIDINE HCL 50 MG/ML IJ SOLN
INTRAMUSCULAR | Status: AC
  Filled 2016-12-15: qty 1

## 2016-12-15 MED ORDER — MIDAZOLAM HCL 5 MG/5ML IJ SOLN
INTRAMUSCULAR | Status: AC
  Filled 2016-12-15: qty 10

## 2016-12-15 MED ORDER — PROMETHAZINE HCL 25 MG/ML IJ SOLN
INTRAMUSCULAR | Status: AC
  Filled 2016-12-15: qty 1

## 2016-12-15 MED ORDER — SODIUM CHLORIDE 0.9 % IV SOLN
INTRAVENOUS | Status: DC
  Administered 2016-12-15: 09:00:00 via INTRAVENOUS

## 2016-12-15 MED ORDER — PROMETHAZINE HCL 25 MG/ML IJ SOLN
INTRAMUSCULAR | Status: DC | PRN
  Administered 2016-12-15 (×2): 6.25 mg via INTRAVENOUS

## 2016-12-15 MED ORDER — SODIUM CHLORIDE 0.9% FLUSH
INTRAVENOUS | Status: AC
  Filled 2016-12-15: qty 10

## 2016-12-15 MED ORDER — MIDAZOLAM HCL 5 MG/5ML IJ SOLN
INTRAMUSCULAR | Status: DC | PRN
  Administered 2016-12-15 (×5): 2 mg via INTRAVENOUS

## 2016-12-15 MED ORDER — STERILE WATER FOR IRRIGATION IR SOLN
Status: DC | PRN
  Administered 2016-12-15: 2.5 mL

## 2016-12-15 MED ORDER — MEPERIDINE HCL 50 MG/ML IJ SOLN
INTRAMUSCULAR | Status: DC | PRN
  Administered 2016-12-15 (×4): 25 mg via INTRAVENOUS

## 2016-12-15 NOTE — Discharge Instructions (Signed)
No Aspirin and NSAIDs for 1 week. Resume usual medications and diet. No driving for 24 hours. Physician will call with biopsy results.        Colonoscopy, Adult, Care After This sheet gives you information about how to care for yourself after your procedure. Your doctor may also give you more specific instructions. If you have problems or questions, call your doctor. Follow these instructions at home: General instructions   For the first 24 hours after the procedure: ? Do not drive or use machinery. ? Do not sign important documents. ? Do not drink alcohol. ? Do your daily activities more slowly than normal. ? Eat foods that are soft and easy to digest. ? Rest often.  Take over-the-counter or prescription medicines only as told by your doctor.  It is up to you to get the results of your procedure. Ask your doctor, or the department performing the procedure, when your results will be ready. To help cramping and bloating:  Try walking around.  Put heat on your belly (abdomen) as told by your doctor. Use a heat source that your doctor recommends, such as a moist heat pack or a heating pad. ? Put a towel between your skin and the heat source. ? Leave the heat on for 20-30 minutes. ? Remove the heat if your skin turns bright red. This is especially important if you cannot feel pain, heat, or cold. You can get burned. Eating and drinking  Drink enough fluid to keep your pee (urine) clear or pale yellow.  Return to your normal diet as told by your doctor. Avoid heavy or fried foods that are hard to digest.  Avoid drinking alcohol for as long as told by your doctor. Contact a doctor if:  You have blood in your poop (stool) 2-3 days after the procedure. Get help right away if:  You have more than a small amount of blood in your poop.  You see large clumps of tissue (blood clots) in your poop.  Your belly is swollen.  You feel sick to your stomach (nauseous).  You throw  up (vomit).  You have a fever.  You have belly pain that gets worse, and medicine does not help your pain. This information is not intended to replace advice given to you by your health care provider. Make sure you discuss any questions you have with your health care provider. Document Released: 04/03/2010 Document Revised: 11/24/2015 Document Reviewed: 11/24/2015 Elsevier Interactive Patient Education  2017 [place].    Colon Polyps Polyps are tissue growths inside the body. Polyps can grow in many places, including the large intestine (colon). A polyp may be a round bump or a mushroom-shaped growth. You could have one polyp or several. Most colon polyps are noncancerous (benign). However, some colon polyps can become cancerous over time. What are the causes? The exact cause of colon polyps is not known. What increases the risk? This condition is more likely to develop in people who:  Have a family history of colon cancer or colon polyps.  Are older than 24 or older than 45 if they are African American.  Have inflammatory bowel disease, such as ulcerative colitis or Crohn disease.  Are overweight.  Smoke cigarettes.  Do not get enough exercise.  Drink too much alcohol.  Eat a diet that is: ? High in fat and red meat. ? Low in fiber.  Had childhood cancer that was treated with abdominal radiation.  What are the signs or symptoms? Most  polyps do not cause symptoms. If you have symptoms, they may include:  Blood coming from your rectum when having a bowel movement.  Blood in your stool.The stool may look dark red or black.  A change in bowel habits, such as constipation or diarrhea.  How is this diagnosed? This condition is diagnosed with a colonoscopy. This is a procedure that uses a lighted, flexible scope to look at the inside of your colon. How is this treated? Treatment for this condition involves removing any polyps that are found. Those polyps will then  be tested for cancer. If cancer is found, your health care provider will talk to you about options for colon cancer treatment. Follow these instructions at home: Diet  Eat plenty of fiber, such as fruits, vegetables, and whole grains.  Eat foods that are high in calcium and vitamin D, such as milk, cheese, yogurt, eggs, liver, fish, and broccoli.  Limit foods high in fat, red meats, and processed meats, such as hot dogs, sausage, bacon, and lunch meats.  Maintain a healthy weight, or lose weight if recommended by your health care provider. General instructions  Do not smoke cigarettes.  Do not drink alcohol excessively.  Keep all follow-up visits as told by your health care provider. This is important. This includes keeping regularly scheduled colonoscopies. Talk to your health care provider about when you need a colonoscopy.  Exercise every day or as told by your health care provider. Contact a health care provider if:  You have new or worsening bleeding during a bowel movement.  You have new or increased blood in your stool.  You have a change in bowel habits.  You unexpectedly lose weight. This information is not intended to replace advice given to you by your health care provider. Make sure you discuss any questions you have with your health care provider. Document Released: 11/26/2003 Document Revised: 08/07/2015 Document Reviewed: 01/20/2015 Elsevier Interactive Patient Education  [employer].

## 2016-12-15 NOTE — H&P (Signed)
[name] [name] is an 66 y.o. female.   Chief Complaint: patient is here for colonoscopy. HPI: patient is 50-old Caucasian female who is here for screening colonoscopy. She denies abdominal pain change in bowel habits or rectal bleeding. She says she had incomplete colonoscopy at age 33 when she was having profuse diarrhea. She wonders if diarrhea was due to thyroid disease. Family history is negative for CRC.  Past Medical History:  Diagnosis Date  . Anxiety   . Arthritis   . 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 Cancer ([place])    cervix  . Chronic sinusitis    [facility]  . COPD (chronic obstructive pulmonary disease) ([place])    PFT 2011  . Depression   . Hypothyroidism   . Keratoconjunctivitis    Right eye  . Thyroid disease    Grave disease    Past Surgical History:  Procedure Laterality Date  . ABDOMINAL HYSTERECTOMY     cervical cancer  . BREAST LUMPECTOMY Right 11/25/2006   malignant  . BREAST SURGERY    . ECTOPIC PREGNANCY SURGERY    . TOTAL HIP ARTHROPLASTY Left     Family History  Problem Relation Age of Onset  . Cancer Mother        lung  . Cancer Sister        ?lymphoma  . Diabetes Maternal Grandmother   . Dementia Father   . Colon cancer Neg Hx    Social History:  reports that she has been smoking.  She has a 60.00 pack-year smoking history. She has never used smokeless tobacco. She reports that she drinks alcohol. She reports that she does not use drugs.  Allergies:  Allergies  Allergen Reactions  . Ibuprofen Hives  . Naproxen Sodium Hives  . Aspirin Hives  . Latex Rash  . Vicodin [Hydrocodone-Acetaminophen] Hives  . Hydrocodeine [Dihydrocodeine] Rash    Medications Prior to Admission  Medication Sig Dispense Refill  . levothyroxine (LEVOTHROID) 25 MCG tablet Take 1 tablet (25 mcg total) by mouth daily before breakfast. 30 tablet 6  . simvastatin (ZOCOR) 20 MG tablet Take 20 mg by mouth daily.    . traMADol (ULTRAM) 50 MG tablet  TAKE 1 TABLET BY MOUTH EVERY 8 HOURS AS NEEDED (Patient taking differently: TAKE 50mg  BY MOUTH EVERY 8 HOURS AS NEEDED) 30 tablet 1  . zolpidem (AMBIEN) 10 MG tablet TAKE 1 TABLET BY MOUTH IN THE EVENING AT BEDTIME AS NEEDED (Patient taking differently: TAKE 10mg  inTHE EVENING AT BEDTIME AS NEEDED) 30 tablet 1  . albuterol (PROVENTIL HFA;VENTOLIN HFA) 108 (90 Base) MCG/ACT inhaler Inhale 2 puffs into the lungs every 6 (six) hours as needed for wheezing or shortness of breath. 1 Inhaler 1  . atorvastatin (LIPITOR) 20 MG tablet Take 1 tablet (20 mg total) by mouth daily. (Patient not taking: Reported on 12/10/2016) 90 tablet 3  . cyclobenzaprine (FLEXERIL) 10 MG tablet TAKE 1 TABLET BY MOUTH THREE TIMES DAILY AS NEEDED FOR MUSCLE SPASM (Patient not taking: Reported on 12/10/2016) 30 tablet 1  . dexlansoprazole (DEXILANT) 60 MG capsule Take 1 capsule (60 mg total) by mouth daily. (Patient not taking: Reported on 12/10/2016) 30 capsule 2  . fesoterodine (TOVIAZ) 4 MG TB24 tablet Take 1 tablet (4 mg total) by mouth daily. (Patient not taking: Reported on 12/10/2016) 30 tablet 4  . tiZANidine (ZANAFLEX) 4 MG tablet Take 1 tablet (4 mg total) by mouth every 6 (six) hours as needed for  muscle spasms. (Patient not taking: Reported on 12/10/2016) 30 tablet 0    No results found for this or any previous visit (from the past 48 hour(s)). No results found.  ROS  Blood pressure (!) 174/69, pulse 98, temperature 97.9 F (36.6 C), temperature source Oral, resp. rate (!) 24, height 5\' 8"  (1.727 m), weight 200 lb (90.7 kg), SpO2 95 %. Physical Exam  Constitutional: She appears well-developed and well-nourished.  HENT:  Mouth/Throat: Oropharynx is clear and moist.  Eyes: Conjunctivae are normal. No scleral icterus.  Neck: No thyromegaly present.  Cardiovascular: Normal rate, regular rhythm and normal heart sounds.   No murmur heard. Respiratory: Effort normal and breath sounds normal.  GI:  Abdomen is full with  lower midline and mid abdominal scars. Abdomen is soft and non-tender without organomegaly or masses.  Musculoskeletal: She exhibits no edema.  Lymphadenopathy:    She has no cervical adenopathy.  Neurological: She is alert.  Skin: Skin is warm.     Assessment/Plan Average risk screening colonoscopy.  Hildred Laser, MD 12/15/2016, 9:21 AM

## 2016-12-15 NOTE — Op Note (Signed)
[facility] Patient Name: [name] [name] Procedure Date: 12/15/2016 8:59 AM MRN: [id number] Date of Birth: [birth year] Attending MD: Hildred Laser , MD CSN: [id number] Age: 66 Admit Type: Outpatient Procedure:                Colonoscopy Indications:              Screening for colorectal malignant neoplasm Providers:                Hildred Laser, MD, Lurline Del, RN, Selena Lesser,                            Aram Candela Referring MD:             Modena Nunnery. [place], MD Medicines:                Promethazine 12.5 mg IV, Meperidine 100 mg IV,                            Midazolam 10 mg IV Complications:            No immediate complications. Estimated Blood Loss:     Estimated blood loss was minimal. Procedure:                Pre-Anesthesia Assessment:                           - Prior to the procedure, a History and Physical                            was performed, and patient medications and                            allergies were reviewed. The patient's tolerance of                            previous anesthesia was also reviewed. The risks                            and benefits of the procedure and the sedation                            options and risks were discussed with the patient.                            All questions were answered, and informed consent                            was obtained. Prior Anticoagulants: The patient has                            taken no previous anticoagulant or antiplatelet                            agents. ASA Grade Assessment: III - A patient with  severe systemic disease. After reviewing the risks                            and benefits, the patient was deemed in                            satisfactory condition to undergo the procedure.                           After obtaining informed consent, the colonoscope                            was passed under direct vision. Throughout the       procedure, the patient's blood pressure, pulse, and                            oxygen saturations were monitored continuously. The                            [id number] ([id number]) was introduced through the                            anus and advanced to the the cecum, identified by                            appendiceal orifice and ileocecal valve. The                            colonoscopy was performed without difficulty. The                            patient tolerated the procedure well. The quality                            of the bowel preparation was good. The ileocecal                            valve, appendiceal orifice, and rectum were                            photographed. Scope In: 9:38:52 AM Scope Out: 10:17:13 AM Scope Withdrawal Time: 0 hours 22 minutes 33 seconds  Total Procedure Duration: 0 hours 38 minutes 21 seconds  Findings:      The perianal and digital rectal examinations were normal.      Three sessile polyps were found in the hepatic flexure, ascending colon       and cecum. The polyps were small in size. These polyps were removed with       a cold snare. Resection and retrieval were complete. The pathology       specimen was placed into Bottle Number 1.      A 10 mm polyp was found in the ascending colon. The polyp was       semi-pedunculated. The polyp was removed with a hot snare. Resection and       retrieval were  complete. The pathology specimen was placed into Bottle       Number 2.      A small polyp was found in the distal sigmoid colon. The polyp was       sessile. The polyp was removed with a cold snare. Resection and       retrieval were complete. The pathology specimen was placed into Bottle       Number 3.      A 7 mm polyp was found in the recto-sigmoid colon. The polyp was       semi-pedunculated. The polyp was removed with a hot snare. Resection and       retrieval were complete. The pathology specimen was placed into Bottle       Number  3.      External hemorrhoids were found during retroflexion. The hemorrhoids       were small. Impression:               - Three small polyps at the hepatic flexure, in the                            ascending colon and in the cecum, removed with a                            cold snare. Resected and retrieved.                           - One 10 mm polyp in the ascending colon, removed                            with a hot snare. Resected and retrieved.                           - One small polyp in the distal sigmoid colon,                            removed with a cold snare. Resected and retrieved.                           - One 7 mm polyp at the recto-sigmoid colon,                            removed with a hot snare. Resected and retrieved.                           - External hemorrhoids. Moderate Sedation:      Moderate (conscious) sedation was administered by the endoscopy nurse       and supervised by the endoscopist. The following parameters were       monitored: oxygen saturation, heart rate, blood pressure, CO2       capnography and response to care. Total physician intraservice time was       51 minutes. Recommendation:           - Patient has a contact number available for  emergencies. The signs and symptoms of potential                            delayed complications were discussed with the                            patient. Return to normal activities tomorrow.                            Written discharge instructions were provided to the                            patient.                           - Resume previous diet today.                           - Continue present medications.                           - No aspirin, ibuprofen, naproxen, or other                            non-steroidal anti-inflammatory drugs for 7 days                            after polyp removal.                           - Await pathology results.                            - Repeat colonoscopy date to be determined after                            pending pathology results are reviewed for                            surveillance. Procedure Code(s):        --- Professional ---                           [phone], Colonoscopy, flexible; with removal of                            tumor(s), polyp(s), or other lesion(s) by snare                            technique                           99152, Moderate sedation services provided by the                            same physician or other qualified health care  professional performing the diagnostic or                            therapeutic service that the sedation supports,                            requiring the presence of an independent trained                            observer to assist in the monitoring of the                            patient's level of consciousness and physiological                            status; initial 15 minutes of intraservice time,                            patient age 45 years or older                           [phone], Moderate sedation services; each additional                            15 minutes intraservice time                           [phone], Moderate sedation services; each additional                            15 minutes intraservice time Diagnosis Code(s):        --- Professional ---                           Z12.11, Encounter for screening for malignant                            neoplasm of colon                           D12.3, Benign neoplasm of transverse colon (hepatic                            flexure or splenic flexure)                           D12.2, Benign neoplasm of ascending colon                           D12.0, Benign neoplasm of cecum                           D12.5, Benign neoplasm of sigmoid colon                           D12.7, Benign neoplasm of rectosigmoid junction  K64.4,  Residual hemorrhoidal skin tags CPT copyright 2016 American Medical Association. All rights reserved. The codes documented in this report are preliminary and upon coder review may  be revised to meet current compliance requirements. Hildred Laser, MD Hildred Laser, MD 12/15/2016 10:29:52 AM This report has been signed electronically. Number of Addenda: 0

## 2016-12-16 ENCOUNTER — Telehealth: Payer: Self-pay | Admitting: Family Medicine

## 2016-12-16 NOTE — Telephone Encounter (Signed)
Call placed to patient.   Reports that she is requesting refill on Xanax. Has had several new medical procedures lately and feels like she may need something for her anxiety.   Ok to refill?

## 2016-12-16 NOTE — Telephone Encounter (Signed)
Xanax has not been filled since 05/2015. Call placed to patient to inquire. No answer. No VM.

## 2016-12-16 NOTE — Telephone Encounter (Signed)
Pt needs refill on xanax sent to [facility] in [place].

## 2016-12-17 MED ORDER — ALPRAZOLAM 0.5 MG PO TABS: 0.5000 mg | ORAL_TABLET | Freq: Two times a day (BID) | ORAL | 0 refills | Status: DC | PRN

## 2016-12-17 NOTE — Telephone Encounter (Signed)
Okay to give Xanax 0.5mg  BID  Prn #30  No refills

## 2016-12-17 NOTE — Telephone Encounter (Signed)
Medication called to pharmacy. 

## 2016-12-20 ENCOUNTER — Encounter (HOSPITAL_COMMUNITY): Payer: Self-pay | Admitting: Internal Medicine

## 2017-01-10 ENCOUNTER — Encounter: Payer: Self-pay | Admitting: Family Medicine

## 2017-01-10 ENCOUNTER — Ambulatory Visit (INDEPENDENT_AMBULATORY_CARE_PROVIDER_SITE_OTHER): Payer: Medicare PPO | Admitting: Family Medicine

## 2017-01-10 VITALS — BP 132/80 | HR 90 | Temp 97.8°F | Resp 14 | Ht 68.0 in | Wt 193.0 lb

## 2017-01-10 DIAGNOSIS — M545 Low back pain, unspecified: Secondary | ICD-10-CM

## 2017-01-10 DIAGNOSIS — Z96642 Presence of left artificial hip joint: Secondary | ICD-10-CM | POA: Diagnosis not present

## 2017-01-10 DIAGNOSIS — N390 Urinary tract infection, site not specified: Secondary | ICD-10-CM | POA: Diagnosis not present

## 2017-01-10 DIAGNOSIS — M1612 Unilateral primary osteoarthritis, left hip: Secondary | ICD-10-CM

## 2017-01-10 DIAGNOSIS — G8929 Other chronic pain: Secondary | ICD-10-CM | POA: Diagnosis not present

## 2017-01-10 DIAGNOSIS — M25552 Pain in left hip: Secondary | ICD-10-CM | POA: Diagnosis not present

## 2017-01-10 LAB — URINALYSIS, ROUTINE W REFLEX MICROSCOPIC
Bilirubin Urine: NEGATIVE
GLUCOSE, UA: NEGATIVE
Hgb urine dipstick: NEGATIVE
Leukocytes, UA: NEGATIVE
Nitrite: POSITIVE — AB
PH: 5.5 (ref 5.0–8.0)
RBC / HPF: NONE SEEN /HPF (ref 0–2)
SPECIFIC GRAVITY, URINE: 1.03 (ref 1.001–1.03)

## 2017-01-10 LAB — MICROSCOPIC MESSAGE

## 2017-01-10 MED ORDER — ALBUTEROL SULFATE HFA 108 (90 BASE) MCG/ACT IN AERS: 2.0000 | INHALATION_SPRAY | Freq: Four times a day (QID) | RESPIRATORY_TRACT | 1 refills | Status: DC | PRN

## 2017-01-10 MED ORDER — METHYLPREDNISOLONE 4 MG PO TBPK: ORAL_TABLET | ORAL | 0 refills | Status: DC

## 2017-01-10 MED ORDER — METHOCARBAMOL 500 MG PO TABS: 500.0000 mg | ORAL_TABLET | Freq: Three times a day (TID) | ORAL | 0 refills | Status: DC | PRN

## 2017-01-10 MED ORDER — CEPHALEXIN 500 MG PO CAPS: 500.0000 mg | ORAL_CAPSULE | Freq: Four times a day (QID) | ORAL | 0 refills | Status: DC

## 2017-01-10 MED ORDER — OXYCODONE-ACETAMINOPHEN 5-325 MG PO TABS: 1.0000 | ORAL_TABLET | Freq: Four times a day (QID) | ORAL | 0 refills | Status: DC | PRN

## 2017-01-10 NOTE — Assessment & Plan Note (Signed)
Known OA hip/ s/p placement.  It is difficult to tell if his pain is more her back or her hip though the spine itself is nontender and her x-ray vertebral wise looks okay I think it is coming more from her hip on exam.  I am to give her Medrol Dosepak if she cannot take the typical anti-inflammatories we will also give her Robaxin she has some spasm which she feels in her groin and her thigh as well as oxycodone short-term.  We will get her appointment with orthopedics to evaluate her hip.

## 2017-01-10 NOTE — Progress Notes (Signed)
Subjective:    Patient ID: [name] [name], female    DOB: [birth year], 66 y.o.   MRN: [id number]  Patient presents for Lumbar Back Pain (x2 weeks- L sided lumbar back pain that radiates down L hip and L leg)   Past 2 weeks- increased pain in left lower back mostly in the left hip and her buttocks.  She is status post hip replacement back in 2010 2011.  She complained of left-sided hip pain and back pain back in May obtain x-ray of the lumbar spine which was normal x-ray of the hip showed her previous replacement but everything appeared to be intact.  She denies any injury no falls recently to trigger her pain.  She now cannot sit or walk any significant period of time without severe pain.  Her husband is having to cook and clean because of her pain. She recently had a colonoscopy but otherwise having some loose stool afterwards no changes.  She does complain of some dysuria for the past few days but no blood in the urine.  No fever. Been taking over-the-counter backache medication which is at minimal improvement  He denies any tingling or numbness in her foot   [employer]-  2010/2011  Review Of Systems:  GEN- denies fatigue, fever, weight loss,weakness, recent illness HEENT- denies eye drainage, change in vision, nasal discharge, CVS- denies chest pain, palpitations RESP- denies SOB, cough, wheeze ABD- denies N/V, change in stools, abd pain GU- + dysuria, hematuria, dribbling, incontinence MSK- +joint pain, muscle aches, injury Neuro- denies headache, dizziness, syncope, seizure activity       Objective:    BP 132/80   Pulse 90   Temp 97.8 F (36.6 C) (Oral)   Resp 14   Ht 5\' 8"  (1.727 m)   Wt 193 lb (87.5 kg)   SpO2 97%   BMI 29.35 kg/m  GEN- NAD, alert and oriented x3,antalgic gait, walking with cane  CVS- RRR, no murmur RESP-CTAB ABD-NABS,soft,NT,ND, no CVA tenderness MSK- Decreased ROM spine, Left hip, pain with IR/ER, Neg SLR, pain with extension and  left lift into left  Hip/groin, Neuro- decreased strength Left 4+/5 compared to Right LE, sensation in tact, DTR symmetric  EXT- No edema Pulses- Radial, DP- 2+        Assessment & Plan:      Problem List Items Addressed This Visit      Unprioritized   OA (osteoarthritis) of hip    Known OA hip/ s/p placement.  It is difficult to tell if his pain is more her back or her hip though the spine itself is nontender and her x-ray vertebral wise looks okay I think it is coming more from her hip on exam.  I am to give her Medrol Dosepak if she cannot take the typical anti-inflammatories we will also give her Robaxin she has some spasm which she feels in her groin and her thigh as well as oxycodone short-term.  We will get her appointment with orthopedics to evaluate her hip.         Relevant Medications   oxyCODONE-acetaminophen (PERCOCET) 5-325 MG tablet   methocarbamol (ROBAXIN) 500 MG tablet   methylPREDNISolone (MEDROL DOSEPAK) 4 MG TBPK tablet   Other Relevant Orders   Ambulatory referral to Orthopedic Surgery    Other Visit Diagnoses    Chronic left hip pain    -  Primary   Relevant Medications   oxyCODONE-acetaminophen (PERCOCET) 5-325 MG tablet   methocarbamol (ROBAXIN) 500 MG tablet  methylPREDNISolone (MEDROL DOSEPAK) 4 MG TBPK tablet   Other Relevant Orders   Ambulatory referral to Orthopedic Surgery   S/P hip replacement, left       Relevant Orders   Ambulatory referral to Orthopedic Surgery   Acute left-sided low back pain without sciatica       Relevant Medications   oxyCODONE-acetaminophen (PERCOCET) 5-325 MG tablet   methocarbamol (ROBAXIN) 500 MG tablet   methylPREDNISolone (MEDROL DOSEPAK) 4 MG TBPK tablet   Urinary tract infection without hematuria, site unspecified       keflex x 5 days, I dont think UTI is cause of pain   Relevant Medications   cephALEXin (KEFLEX) 500 MG capsule   Other Relevant Orders   Urinalysis, Routine w reflex microscopic  (Completed)   Urine Culture      Note: This dictation was prepared with Dragon dictation along with smaller phrase technology. Any transcriptional errors that result from this process are unintentional.

## 2017-01-10 NOTE — Patient Instructions (Addendum)
Take pain medications  Take steroids Take muscle relaxer Referral to othopedics  F/U as previous

## 2017-01-13 LAB — URINE CULTURE
MICRO NUMBER: 81209095
SPECIMEN QUALITY: ADEQUATE

## 2017-01-31 ENCOUNTER — Other Ambulatory Visit: Payer: Self-pay | Admitting: Family Medicine

## 2017-01-31 NOTE — Telephone Encounter (Signed)
Ok to refill??  Last office visit 01/10/2017.  Last refill 12/01/2016, #1 refill.

## 2017-01-31 NOTE — Telephone Encounter (Signed)
Okay to refill? 

## 2017-02-01 NOTE — Telephone Encounter (Signed)
Medication called to pharmacy. 

## 2017-04-01 ENCOUNTER — Other Ambulatory Visit: Payer: Self-pay | Admitting: Family Medicine

## 2017-04-01 NOTE — Telephone Encounter (Signed)
Ok to refill??  Last office visit 01/10/2017.  Last refill 02/01/2017, #1 refill.

## 2017-04-18 ENCOUNTER — Telehealth: Payer: Self-pay | Admitting: *Deleted

## 2017-04-18 NOTE — Telephone Encounter (Signed)
Received PA determination.   PA approved 03/13/2017- 03/14/2018.  Faxed to pharmacy.

## 2017-04-20 DIAGNOSIS — H5210 Myopia, unspecified eye: Secondary | ICD-10-CM | POA: Diagnosis not present

## 2017-04-25 ENCOUNTER — Encounter: Payer: Self-pay | Admitting: Family Medicine

## 2017-05-27 ENCOUNTER — Encounter: Payer: Self-pay | Admitting: Family Medicine

## 2017-05-27 ENCOUNTER — Ambulatory Visit (INDEPENDENT_AMBULATORY_CARE_PROVIDER_SITE_OTHER): Payer: Medicare HMO | Admitting: Family Medicine

## 2017-05-27 ENCOUNTER — Other Ambulatory Visit: Payer: Self-pay

## 2017-05-27 VITALS — BP 130/68 | HR 72 | Temp 97.8°F | Resp 16 | Ht 68.0 in | Wt 195.0 lb

## 2017-05-27 DIAGNOSIS — M1612 Unilateral primary osteoarthritis, left hip: Secondary | ICD-10-CM

## 2017-05-27 DIAGNOSIS — N390 Urinary tract infection, site not specified: Secondary | ICD-10-CM

## 2017-05-27 LAB — URINALYSIS, ROUTINE W REFLEX MICROSCOPIC
Bilirubin Urine: NEGATIVE
Glucose, UA: NEGATIVE
HGB URINE DIPSTICK: NEGATIVE
KETONES UR: NEGATIVE
LEUKOCYTES UA: NEGATIVE
NITRITE: POSITIVE — AB
PH: 5.5 (ref 5.0–8.0)
PROTEIN: NEGATIVE
RBC / HPF: NONE SEEN /HPF (ref 0–2)
Specific Gravity, Urine: 1.02 (ref 1.001–1.03)

## 2017-05-27 LAB — MICROSCOPIC MESSAGE

## 2017-05-27 MED ORDER — CEPHALEXIN 500 MG PO CAPS: 500.0000 mg | ORAL_CAPSULE | Freq: Four times a day (QID) | ORAL | 0 refills | Status: DC

## 2017-05-27 MED ORDER — METHOCARBAMOL 500 MG PO TABS: 500.0000 mg | ORAL_TABLET | Freq: Three times a day (TID) | ORAL | 0 refills | Status: DC | PRN

## 2017-05-27 MED ORDER — TRAMADOL HCL 50 MG PO TABS: 50.0000 mg | ORAL_TABLET | Freq: Three times a day (TID) | ORAL | 0 refills | Status: DC | PRN

## 2017-05-27 MED ORDER — ZOLPIDEM TARTRATE 10 MG PO TABS: ORAL_TABLET | ORAL | 1 refills | Status: DC

## 2017-05-27 NOTE — Progress Notes (Signed)
   Subjective:    Patient ID: [name] [name], female    DOB: Dec 08, 1950, 67 y.o.   MRN: [id number]  Patient presents for Frequent UTI (frequent UTI's with back pain as primary sx) and Lumbar Back Pain (has ortho appt, but would like steroid dose pack to tide her over)   Low back pain, feeling weak and tired, frequency uriantion, dysuria for past 4-5 days. History of UTI, treated twice last year. No fever, no abdominal pain     Chronic back pain/Hip pain - feels it more when she is up and moving around   Request refill on robaxin and ultram    Review Of Systems:  GEN- denies fatigue, fever, weight loss,weakness, recent illness HEENT- denies eye drainage, change in vision, nasal discharge, CVS- denies chest pain, palpitations RESP- denies SOB, cough, wheeze ABD- denies N/V, change in stools, abd pain GU- + dysuria, hematuria, dribbling, incontinence MSK- + joint pain, muscle aches, injury Neuro- denies headache, dizziness, syncope, seizure activity       Objective:    BP 130/68   Pulse 72   Temp 97.8 F (36.6 C) (Oral)   Resp 16   Ht 5\' 8"  (1.727 m)   Wt 195 lb (88.5 kg)   SpO2 100%   BMI 29.65 kg/m  GEN- NAD, alert and oriented x3 HEENT- PERRL, EOMI, non injected sclera, pink conjunctiva, MMM, oropharynx clear Neck- Supple, no thyromegaly CVS- RRR, no murmur RESP-CTAB ABD-NABS,soft,TTP suprapubic region,ND, no CVA tenderness,  EXT- No edema Pulses- Radial, DP- 2+        Assessment & Plan:      Problem List Items Addressed This Visit      Unprioritized   OA (osteoarthritis) of hip   Relevant Medications   methocarbamol (ROBAXIN) 500 MG tablet   traMADol (ULTRAM) 50 MG tablet    Other Visit Diagnoses    Frequent UTI    -  Primary   Urine culture sent, start Keflex QID, push water. For her chronic pain refilled robaxin and ultram    Relevant Medications   cephALEXin (KEFLEX) 500 MG capsule   Other Relevant Orders   Urinalysis, Routine w reflex  microscopic   Urine Culture      Note: This dictation was prepared with Dragon dictation along with smaller phrase technology. Any transcriptional errors that result from this process are unintentional.

## 2017-05-27 NOTE — Patient Instructions (Addendum)
F/U 3- 4 months  Take antibiotics as prescribed

## 2017-05-30 LAB — URINE CULTURE
MICRO NUMBER:: 90332343
SPECIMEN QUALITY: ADEQUATE

## 2017-06-06 ENCOUNTER — Encounter: Payer: Self-pay | Admitting: Family Medicine

## 2017-06-16 ENCOUNTER — Telehealth: Payer: Self-pay | Admitting: *Deleted

## 2017-06-16 NOTE — Telephone Encounter (Signed)
Received fax requesting approval to use different manufacturer for levothyroxine.   MD please advise.

## 2017-06-17 NOTE — Telephone Encounter (Signed)
Okay to change? 

## 2017-06-17 NOTE — Telephone Encounter (Signed)
Call placed to [facility] spoke with Loma Sousa to confirm it was fine per provider to change manufacturer

## 2017-07-05 DIAGNOSIS — H2513 Age-related nuclear cataract, bilateral: Secondary | ICD-10-CM | POA: Diagnosis not present

## 2017-07-05 DIAGNOSIS — H25043 Posterior subcapsular polar age-related cataract, bilateral: Secondary | ICD-10-CM | POA: Diagnosis not present

## 2017-07-05 DIAGNOSIS — H25013 Cortical age-related cataract, bilateral: Secondary | ICD-10-CM | POA: Diagnosis not present

## 2017-07-05 DIAGNOSIS — H2511 Age-related nuclear cataract, right eye: Secondary | ICD-10-CM | POA: Diagnosis not present

## 2017-07-05 DIAGNOSIS — H509 Unspecified strabismus: Secondary | ICD-10-CM | POA: Diagnosis not present

## 2017-07-26 ENCOUNTER — Other Ambulatory Visit: Payer: Self-pay | Admitting: Family Medicine

## 2017-07-29 ENCOUNTER — Telehealth: Payer: Self-pay | Admitting: Family Medicine

## 2017-07-29 ENCOUNTER — Other Ambulatory Visit: Payer: Self-pay | Admitting: Family Medicine

## 2017-07-29 MED ORDER — ZOLPIDEM TARTRATE 10 MG PO TABS: ORAL_TABLET | ORAL | 2 refills | Status: DC

## 2017-07-29 NOTE — Telephone Encounter (Signed)
[place]  PATIENT NEEDS REFILL ON HER AMBIEN  SHE IS OUT, PHARMACY SAID THEY SENT IT OVER ON Tuesday

## 2017-07-29 NOTE — Telephone Encounter (Signed)
Ok to refill??  Last office visit/ refill 05/27/2017, #1 refill.   Of note, prescription was sent over via E-Scripts but Epic denied as she was dismissed due to bill. Patient was made aware and bill was paid.

## 2017-08-29 DIAGNOSIS — H2511 Age-related nuclear cataract, right eye: Secondary | ICD-10-CM | POA: Diagnosis not present

## 2017-08-30 DIAGNOSIS — H2512 Age-related nuclear cataract, left eye: Secondary | ICD-10-CM | POA: Diagnosis not present

## 2017-09-19 DIAGNOSIS — H2512 Age-related nuclear cataract, left eye: Secondary | ICD-10-CM | POA: Diagnosis not present

## 2017-09-27 ENCOUNTER — Other Ambulatory Visit: Payer: Self-pay | Admitting: Family Medicine

## 2017-10-02 DIAGNOSIS — N10 Acute tubulo-interstitial nephritis: Secondary | ICD-10-CM | POA: Diagnosis not present

## 2017-10-02 DIAGNOSIS — N3001 Acute cystitis with hematuria: Secondary | ICD-10-CM | POA: Diagnosis not present

## 2017-10-04 ENCOUNTER — Telehealth: Payer: Self-pay

## 2017-10-04 NOTE — Telephone Encounter (Signed)
Patient called requesting pain medication to help with the UTI symptoms she was diagnosed with at urgent care on  7/21. Patient was prescribed Ciprofloxacin and states she is in a lot of pain.Patient was advised to continue to take the medication and drink plenty of water. Patient scheduled an appointment to be seen on 7/24 she was instructed to go to ED if pain gets worse during the day. Patient verbalizes understanding

## 2017-10-05 ENCOUNTER — Encounter: Payer: Self-pay | Admitting: Physician Assistant

## 2017-10-05 ENCOUNTER — Ambulatory Visit (INDEPENDENT_AMBULATORY_CARE_PROVIDER_SITE_OTHER): Payer: Medicare HMO | Admitting: Physician Assistant

## 2017-10-05 VITALS — BP 142/80 | HR 87 | Temp 97.5°F | Resp 18 | Ht 68.0 in | Wt 191.8 lb

## 2017-10-05 DIAGNOSIS — M545 Low back pain: Secondary | ICD-10-CM

## 2017-10-05 DIAGNOSIS — R300 Dysuria: Secondary | ICD-10-CM

## 2017-10-05 LAB — URINALYSIS, ROUTINE W REFLEX MICROSCOPIC
BILIRUBIN URINE: NEGATIVE
Bacteria, UA: NONE SEEN /HPF
Glucose, UA: NEGATIVE
KETONES UR: NEGATIVE
LEUKOCYTES UA: NEGATIVE
NITRITE: NEGATIVE
Protein, ur: NEGATIVE
SPECIFIC GRAVITY, URINE: 1.015 (ref 1.001–1.03)
WBC UA: NONE SEEN /HPF (ref 0–5)
pH: 6 (ref 5.0–8.0)

## 2017-10-05 LAB — MICROSCOPIC MESSAGE

## 2017-10-05 MED ORDER — METHOCARBAMOL 500 MG PO TABS: 500.0000 mg | ORAL_TABLET | Freq: Three times a day (TID) | ORAL | 1 refills | Status: DC | PRN

## 2017-10-05 MED ORDER — TRAMADOL HCL 50 MG PO TABS: 50.0000 mg | ORAL_TABLET | Freq: Three times a day (TID) | ORAL | 0 refills | Status: DC | PRN

## 2017-10-05 NOTE — Progress Notes (Signed)
Patient ID: DWANA GARIN MRN: [id number], DOB: 25-Mar-1950, 67 y.o. Date of Encounter: @DATE @  Chief Complaint:  Chief Complaint  Patient presents with  . Back Pain  . Dysuria    HPI: 67 y.o. year old female  presents with above.   Prior to me entering the room, nursing staff who roomed patient informed me that pt had recently gone to the urgent care and they prescribed Cipro for UTI.  Also prior to entering the room, I reviewed Dr. Dorian Heckle last office visit note dated 05/27/2017.  That note reported that patient had a history of UTIs but also history of lumbar back pain.   Today patient is accompanied by her husband for visit. They report that this past Sunday is when she went to the urgent care and was diagnosed with UTI and prescribed Cipro.  She has been taking the Cipro as directed since then. Today I asked if she had been seeing Orthopedics.   Dr. Dorian Heckle note 05/27/2017 makes mention of Ortho.   However, patient states that she saw orthopedics for her hip replacement which she says was either 2008, 2009, or 2010.   She says that she has not seen orthopedic since then.   She says she only saw them for the hip replacement surgery.  She points to her left low back down near L5 area as area of pain. Says that  "it hurts all across there"--- starts to point to the area on herself but cannot really reach so then points to the area on me as the area of where she is feeling discomfort.   She rubs her hand across waistline on the left low back and then says that the pain also shoots down the lateral side of her left buttock / upper thigh. She has had no pain further down the lateral aspect of the leg.  Only up in the upper thigh.  Has had no weakness or numbness or tingling down the leg.  Reports that she has been using Tylenol with minimal relief. Reviewed that in the past she had been prescribed Robaxin and tramadol.  She states that she is out of both of these  medications.  She is having no back pain higher up in her back at the levels of kidneys.  She is having no fevers or chills.  Uses a walker.    Past Medical History:  Diagnosis Date  . Anxiety   . Arthritis   . 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 Cancer ([place])    cervix  . Chronic sinusitis    [facility]  . COPD (chronic obstructive pulmonary disease) ([place])    PFT 2011  . Depression   . Hypothyroidism   . Keratoconjunctivitis    Right eye  . Thyroid disease    Grave disease     Home Meds: Outpatient Medications Prior to Visit  Medication Sig Dispense Refill  . albuterol (PROVENTIL HFA;VENTOLIN HFA) 108 (90 Base) MCG/ACT inhaler Inhale 2 puffs into the lungs every 6 (six) hours as needed for wheezing or shortness of breath. 1 Inhaler 1  . ALPRAZolam (XANAX) 0.5 MG tablet Take 1 tablet (0.5 mg total) by mouth 2 (two) times daily as needed. 30 tablet 0  . ciprofloxacin (CIPRO) 500 MG tablet Take 500 mg by mouth 2 (two) times daily. for 10 days  0  . levothyroxine (SYNTHROID, LEVOTHROID) 25 MCG tablet TAKE 1 TABLET BY MOUTH ONCE DAILY BEFORE BREAKFAST 30 tablet  6  . simvastatin (ZOCOR) 20 MG tablet Take 20 mg by mouth daily.    [name] zolpidem (AMBIEN) 10 MG tablet TAKE 1 TABLET BY MOUTH AT BEDTIME AS NEEDED FOR INSOMNIA 30 tablet 2  . methocarbamol (ROBAXIN) 500 MG tablet Take 1 tablet (500 mg total) by mouth every 8 (eight) hours as needed for muscle spasms. 30 tablet 0  . traMADol (ULTRAM) 50 MG tablet Take 1 tablet (50 mg total) by mouth every 8 (eight) hours as needed. 30 tablet 0  . cephALEXin (KEFLEX) 500 MG capsule Take 1 capsule (500 mg total) by mouth 4 (four) times daily. 20 capsule 0   No facility-administered medications prior to visit.     Allergies:  Allergies  Allergen Reactions  . Ibuprofen Hives  . Naproxen Sodium Hives  . Aspirin Hives  . Latex Rash  . Vicodin [Hydrocodone-Acetaminophen] Hives  . Hydrocodeine [Dihydrocodeine] Rash    Social  History   Socioeconomic History  . Marital status: Married    Spouse name: Not on file  . Number of children: Not on file  . Years of education: Not on file  . Highest education level: Not on file  Occupational History  . Not on file  Social Needs  . Financial resource strain: Not on file  . Food insecurity:    Worry: Not on file    Inability: Not on file  . Transportation needs:    Medical: Not on file    Non-medical: Not on file  Tobacco Use  . Smoking status: Current Every Day Smoker    Packs/day: 1.50    Years: 40.00    Pack years: 60.00  . Smokeless tobacco: Never Used  . Tobacco comment: quit 11/2013  Substance and Sexual Activity  . Alcohol use: Yes    Alcohol/week: 0.0 oz    Comment: 1/month  . Drug use: No  . Sexual activity: Not Currently    Birth control/protection: Surgical  Lifestyle  . Physical activity:    Days per week: Not on file    Minutes per session: Not on file  . Stress: Not on file  Relationships  . Social connections:    Talks on phone: Not on file    Gets together: Not on file    Attends religious service: Not on file    Active member of club or organization: Not on file    Attends meetings of clubs or organizations: Not on file    Relationship status: Not on file  . Intimate partner violence:    Fear of current or ex partner: Not on file    Emotionally abused: Not on file    Physically abused: Not on file    Forced sexual activity: Not on file  Other Topics Concern  . Not on file  Social History Narrative  . Not on file    Family History  Problem Relation Age of Onset  . Cancer Mother        lung  . Cancer Sister        ?lymphoma  . Diabetes Maternal Grandmother   . Dementia Father   . Colon cancer Neg Hx      Review of Systems:  See HPI for pertinent ROS. All other ROS negative.    Physical Exam: Blood pressure (!) 142/80, pulse 87, temperature (!) 97.5 F (36.4 C), temperature source Oral, resp. rate 18, height 5\' 8"   (1.727 m), weight 87 kg (191 lb 12.8 oz), SpO2 94 %., Body mass index  is 29.16 kg/m. General: Obese WF. Appears in no acute distress. Neck: Supple. No thyromegaly. No lymphadenopathy. Lungs: Clear bilaterally to auscultation without wheezes, rales, or rhonchi. Breathing is unlabored. Heart: RRR with S1 S2. No murmurs, rubs, or gallops. Musculoskeletal:  Strength and tone normal for age. Left low back at ~ L5 level is area of pain. Extremities/Skin: Warm and dry.  Neuro: Alert and oriented X 3. Moves all extremities spontaneously. Gait is normal. CNII-XII grossly in tact. Psych:  Responds to questions appropriately with a normal affect.     ASSESSMENT AND PLAN:  67 y.o. year old female with   1. Low back pain, unspecified back pain laterality, unspecified chronicity, with sciatica presence unspecified The symptoms that she is reporting today are consistent with musculoskeletal low back pain.  Current symptoms not consistent with UTI. So the UA today as not showing signs of current UTI. He is to complete the Cipro that was started by the urgent care.  She very well could have had significant infection on Sunday which is being treated by current antibiotic and therefore UA today is improved.  She needs to come to complete the course of antibiotic. However I think that her current symptoms are musculoskeletal.  Not secondary to UTI. She had lumbar spine x-ray 08/02/2016.  That showed no significant abnormality of the lumbar spine. At this time I will send in refill on Robaxin and tramadol. This should hopefully control her symptoms.   If symptoms are not controlled with this, then she is to call for follow-up. - Urinalysis, Routine w reflex microscopic  2. Dysuria Results for orders placed or performed in visit on 10/05/17  Urinalysis, Routine w reflex microscopic  Result Value Ref Range   Color, Urine YELLOW YELLOW   APPearance CLEAR CLEAR   Specific Gravity, Urine 1.015 1.001 - 1.03    pH 6.0 5.0 - 8.0   Glucose, UA NEGATIVE NEGATIVE   Bilirubin Urine NEGATIVE NEGATIVE   Ketones, ur NEGATIVE NEGATIVE   Hgb urine dipstick TRACE (A) NEGATIVE   Protein, ur NEGATIVE NEGATIVE   Nitrite NEGATIVE NEGATIVE   Leukocytes, UA NEGATIVE NEGATIVE   WBC, UA NONE SEEN 0 - 5 /HPF   RBC / HPF 0-2 0 - 2 /HPF   Squamous Epithelial / LPF 0-5 < OR = 5 /HPF   Bacteria, UA NONE SEEN NONE SEEN /HPF  Microscopic Message  Result Value Ref Range   Note      - Urinalysis, Routine w reflex microscopic    Signed, [address] [place], [place], [facility] 10/05/2017 12:39 PM

## 2017-10-13 ENCOUNTER — Other Ambulatory Visit: Payer: Self-pay | Admitting: Physician Assistant

## 2017-10-14 NOTE — Telephone Encounter (Signed)
Last OV 10/05/2017 Last refill 10/05/2017 Ok to refill?

## 2017-10-14 NOTE — Telephone Encounter (Signed)
I will increase quantity to # 90 --- since she has already used # 30 since 10/05/2017.

## 2017-10-31 ENCOUNTER — Other Ambulatory Visit: Payer: Self-pay | Admitting: Family Medicine

## 2017-10-31 NOTE — Telephone Encounter (Signed)
Ok to refill??  Last office visit 10/05/2017.  Last refill 07/29/2017, #2 refills.

## 2017-11-16 DIAGNOSIS — Z0100 Encounter for examination of eyes and vision without abnormal findings: Secondary | ICD-10-CM | POA: Diagnosis not present

## 2017-12-01 ENCOUNTER — Other Ambulatory Visit: Payer: Self-pay | Admitting: Family Medicine

## 2017-12-01 DIAGNOSIS — Z1231 Encounter for screening mammogram for malignant neoplasm of breast: Secondary | ICD-10-CM

## 2017-12-28 ENCOUNTER — Ambulatory Visit: Payer: Medicare PPO

## 2018-01-11 DIAGNOSIS — M5416 Radiculopathy, lumbar region: Secondary | ICD-10-CM | POA: Diagnosis not present

## 2018-01-11 DIAGNOSIS — M545 Low back pain: Secondary | ICD-10-CM | POA: Diagnosis not present

## 2018-01-11 DIAGNOSIS — Z96642 Presence of left artificial hip joint: Secondary | ICD-10-CM | POA: Diagnosis not present

## 2018-01-25 ENCOUNTER — Other Ambulatory Visit: Payer: Self-pay | Admitting: Family Medicine

## 2018-01-25 NOTE — Telephone Encounter (Signed)
Ok to refill??  Last office visit 10/05/2017.  Last refill 10/31/2017, #2 refills.

## 2018-02-01 ENCOUNTER — Ambulatory Visit
Admission: RE | Admit: 2018-02-01 | Discharge: 2018-02-01 | Disposition: A | Discharge status: Home / Self Care | Payer: Medicare HMO | Source: Ambulatory Visit | Attending: Family Medicine | Admitting: Family Medicine

## 2018-02-01 DIAGNOSIS — Z1231 Encounter for screening mammogram for malignant neoplasm of breast: Secondary | ICD-10-CM

## 2018-02-07 ENCOUNTER — Other Ambulatory Visit: Payer: Self-pay | Admitting: Family Medicine

## 2018-03-30 ENCOUNTER — Other Ambulatory Visit: Payer: Self-pay | Admitting: Family Medicine

## 2018-03-30 NOTE — Telephone Encounter (Signed)
Ok to refill??  Last office visit 10/05/2017.  Last refill 01/26/2018, #1 refill.

## 2018-03-31 ENCOUNTER — Other Ambulatory Visit: Payer: Self-pay | Admitting: *Deleted

## 2018-03-31 MED ORDER — LEVOTHYROXINE SODIUM 25 MCG PO TABS: ORAL_TABLET | ORAL | 6 refills | Status: DC

## 2018-05-24 ENCOUNTER — Other Ambulatory Visit: Payer: Self-pay | Admitting: Family Medicine

## 2018-05-24 NOTE — Telephone Encounter (Signed)
Ok to refill??  Last office visit 10/05/2017.  Last refill 03/30/2018, #1 refill.

## 2018-05-24 NOTE — Telephone Encounter (Signed)
Will give 1 refill need OV

## 2018-06-26 ENCOUNTER — Other Ambulatory Visit: Payer: Self-pay | Admitting: Family Medicine

## 2018-06-26 NOTE — Telephone Encounter (Signed)
Ok to refill??  Last office visit 05/27/2017.  Last refill 05/24/2018.

## 2018-07-27 ENCOUNTER — Other Ambulatory Visit: Payer: Self-pay | Admitting: *Deleted

## 2018-07-27 NOTE — Telephone Encounter (Signed)
Received fax requesting refill on Ambien.   Ok to refill??  Last office visit 10/05/2017.  Last refill 06/26/2018.

## 2018-07-28 ENCOUNTER — Encounter: Payer: Self-pay | Admitting: *Deleted

## 2018-07-28 MED ORDER — ZOLPIDEM TARTRATE 10 MG PO TABS: 10.0000 mg | ORAL_TABLET | Freq: Every evening | ORAL | 0 refills | Status: DC | PRN

## 2018-07-28 NOTE — Telephone Encounter (Signed)
Letter sent.

## 2018-07-28 NOTE — Telephone Encounter (Signed)
Needs OV, will give 1 month only

## 2018-07-31 ENCOUNTER — Telehealth: Payer: Self-pay | Admitting: *Deleted

## 2018-07-31 NOTE — Telephone Encounter (Signed)
Received PA determination.   PA Case [id number] approved 03/13/2018- 03/15/2019.   Pharmacy made aware.

## 2018-07-31 NOTE — Telephone Encounter (Signed)
Received request from pharmacy for PA on Ambien.  Patient plan allows 90tabs/ 365 days.   QL PA submitted.   Dx: F1.01- chronic insomnia

## 2018-07-31 NOTE — Telephone Encounter (Signed)
[employer] has not yet replied to your PA request. You may close this dialog, return to your dashboard, and perform other tasks.  To check for an update later, open this request again from your dashboard.  If [employer] has not replied to your request within 24 hours please contact [employer] at [phone].

## 2018-08-16 ENCOUNTER — Other Ambulatory Visit: Payer: Self-pay

## 2018-08-16 ENCOUNTER — Ambulatory Visit (INDEPENDENT_AMBULATORY_CARE_PROVIDER_SITE_OTHER): Payer: Medicare HMO | Admitting: Family Medicine

## 2018-08-16 VITALS — BP 124/60 | HR 90 | Temp 98.5°F | Resp 18 | Ht 68.0 in | Wt 187.4 lb

## 2018-08-16 DIAGNOSIS — M67431 Ganglion, right wrist: Secondary | ICD-10-CM | POA: Diagnosis not present

## 2018-08-16 DIAGNOSIS — E782 Mixed hyperlipidemia: Secondary | ICD-10-CM

## 2018-08-16 DIAGNOSIS — E0500 Thyrotoxicosis with diffuse goiter without thyrotoxic crisis or storm: Secondary | ICD-10-CM

## 2018-08-16 DIAGNOSIS — E039 Hypothyroidism, unspecified: Secondary | ICD-10-CM

## 2018-08-16 NOTE — Progress Notes (Signed)
   Subjective:    Patient ID: [name] [name], female    DOB: 1950-04-26, 68 y.o.   MRN: [id number]  Patient presents for Medication Refill (all) and Mass (R wrist, pt states its getting bigger and painful)   Pt here to f/u chronic medical problems. Had cataract surgery since our last visit. Declines having more surgery for Graves disease    Hypothyroidism- taking synthroid 9mcg in the morning     Hyperlipidemia- taking lipitor no difficulty     Chronic insomnia- using ambien regulary      Chronic pain of back, taking tylenol at bedtime as needed / has not needed robaxin    Has not used albuterol     Has knot on rigjt wrist for > 6 mnths enlarging and tender   Review Of Systems:  GEN- denies fatigue, fever, weight loss,weakness, recent illness HEENT- denies eye drainage, change in vision, nasal discharge, CVS- denies chest pain, palpitations RESP- denies SOB, cough, wheeze ABD- denies N/V, change in stools, abd pain GU- denies dysuria, hematuria, dribbling, incontinence MSK- denies joint pain, muscle aches, injury Neuro- denies headache, dizziness, syncope, seizure activity       Objective:    BP 124/60   Pulse 90   Temp 98.5 F (36.9 C)   Resp 18   Ht 5\' 8"  (1.727 m)   Wt 187 lb 6.4 oz (85 kg)   SpO2 95%   BMI 28.49 kg/m  GEN- NAD, alert and oriented x3 HEENT- PERRL, EOMI, non injected sclera, pink conjunctiva, MMM, oropharynx clear Neck- Supple, no thyromegaly CVS- RRR, no murmur RESP-CTAB MSK- Right wrist- nodule dorsum, TTP, no erythema, good ROM wrist, hand EXT- No edema Pulses- Radial, DP- 2+        Assessment & Plan:      Problem List Items Addressed This Visit      Unprioritized   Graves' eye disease    With hypothyroidism, recheck TSH      Relevant Orders   TSH (Completed)   Hyperlipidemia - Primary    Check lipids,continue lipitor       Relevant Orders   CBC with Differential/Platelet (Completed)   Comprehensive metabolic panel  (Completed)   Lipid panel (Completed)   Hypothyroidism    Other Visit Diagnoses    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 Any transcriptional errors that result from this process are unintentional.

## 2018-08-16 NOTE — Patient Instructions (Signed)
Referral to hand surgeon  F/U 6 months for Physical

## 2018-08-17 ENCOUNTER — Encounter: Payer: Self-pay | Admitting: Family Medicine

## 2018-08-17 ENCOUNTER — Other Ambulatory Visit: Payer: Self-pay | Admitting: Family Medicine

## 2018-08-17 DIAGNOSIS — E0590 Thyrotoxicosis, unspecified without thyrotoxic crisis or storm: Secondary | ICD-10-CM

## 2018-08-17 DIAGNOSIS — E039 Hypothyroidism, unspecified: Secondary | ICD-10-CM | POA: Insufficient documentation

## 2018-08-17 LAB — COMPREHENSIVE METABOLIC PANEL
AG Ratio: 1.7 (calc) (ref 1.0–2.5)
ALT: 15 U/L (ref 6–29)
AST: 18 U/L (ref 10–35)
Albumin: 4.3 g/dL (ref 3.6–5.1)
Alkaline phosphatase (APISO): 93 U/L (ref 37–153)
BUN: 20 mg/dL (ref 7–25)
CO2: 27 mmol/L (ref 20–32)
Calcium: 9.4 mg/dL (ref 8.6–10.4)
Chloride: 104 mmol/L (ref 98–110)
Creat: 0.82 mg/dL (ref 0.50–0.99)
Globulin: 2.6 g/dL (calc) (ref 1.9–3.7)
Glucose, Bld: 111 mg/dL — ABNORMAL HIGH (ref 65–99)
Potassium: 4.5 mmol/L (ref 3.5–5.3)
Sodium: 141 mmol/L (ref 135–146)
Total Bilirubin: 0.4 mg/dL (ref 0.2–1.2)
Total Protein: 6.9 g/dL (ref 6.1–8.1)

## 2018-08-17 LAB — CBC WITH DIFFERENTIAL/PLATELET
Absolute Monocytes: 768 cells/uL (ref 200–950)
Basophils Absolute: 7 cells/uL (ref 0–200)
Basophils Relative: 0.1 %
Eosinophils Absolute: 0 cells/uL — ABNORMAL LOW (ref 15–500)
Eosinophils Relative: 0 %
HCT: 41.6 % (ref 35.0–45.0)
Hemoglobin: 14.1 g/dL (ref 11.7–15.5)
Lymphs Abs: 1856 cells/uL (ref 850–3900)
MCH: 30.7 pg (ref 27.0–33.0)
MCHC: 33.9 g/dL (ref 32.0–36.0)
MCV: 90.4 fL (ref 80.0–100.0)
MPV: 11.8 fL (ref 7.5–12.5)
Monocytes Relative: 11.3 %
Neutro Abs: 4168 cells/uL (ref 1500–7800)
Neutrophils Relative %: 61.3 %
Platelets: 240 10*3/uL (ref 140–400)
RBC: 4.6 10*6/uL (ref 3.80–5.10)
RDW: 13 % (ref 11.0–15.0)
Total Lymphocyte: 27.3 %
WBC: 6.8 10*3/uL (ref 3.8–10.8)

## 2018-08-17 LAB — LIPID PANEL
Cholesterol: 191 mg/dL (ref <200)
HDL: 45 mg/dL — ABNORMAL LOW (ref >=50)
LDL Cholesterol (Calc): 113 mg/dL (calc) — ABNORMAL HIGH
Non-HDL Cholesterol (Calc): 146 mg/dL (calc) — ABNORMAL HIGH (ref <130)
Total CHOL/HDL Ratio: 4.2 (calc) (ref <5.0)
Triglycerides: 212 mg/dL — ABNORMAL HIGH (ref <150)

## 2018-08-17 LAB — TSH: TSH: 5.84 mIU/L — ABNORMAL HIGH (ref 0.40–4.50)

## 2018-08-17 MED ORDER — LEVOTHYROXINE SODIUM 50 MCG PO TABS: 50.0000 ug | ORAL_TABLET | Freq: Every day | ORAL | 3 refills | Status: DC

## 2018-08-17 MED ORDER — ATORVASTATIN CALCIUM 40 MG PO TABS: 40.0000 mg | ORAL_TABLET | Freq: Every day | ORAL | 3 refills | Status: DC

## 2018-08-17 NOTE — Assessment & Plan Note (Signed)
With hypothyroidism, recheck TSH

## 2018-08-17 NOTE — Assessment & Plan Note (Signed)
Check lipids, continue lipitor

## 2018-09-06 ENCOUNTER — Telehealth: Payer: Self-pay | Admitting: *Deleted

## 2018-09-06 NOTE — Telephone Encounter (Signed)
Received call from patient. [phone]~ telephone.   Reports that she is attempting to quit smoking. States that her insurance will not cover Chantix. States that she has high anxiety at baseline, and trying to quit smoking is increasing her anxiety. Reports that she used low dose Xanax in the past for anxiety with good results. Requested to go back on Xanax for a little while to decrease anxiety about quitting.   Advised that we can use Wellbutrin for smoking cessation and anxiety, but she states that she has never tried medication in the past. Reports that she would be more comfortable with the Xanax.   MD please advise.

## 2018-09-06 NOTE — Telephone Encounter (Signed)
I do not recommend using xanax for her anxiety trying to quit smoking She has ultram and ambien which increase reactions with that medication  I recommend wellbutrin 150mg  BID for smoking  If she doesn't want to do the wellbutrin, she can take buspar  7.5mg  BID for anxiety

## 2018-09-07 MED ORDER — BUPROPION HCL ER (SR) 150 MG PO TB12: 150.0000 mg | ORAL_TABLET | Freq: Two times a day (BID) | ORAL | 1 refills | Status: DC

## 2018-09-07 NOTE — Telephone Encounter (Signed)
Call placed to patient and patient made aware.   Agreeable to plan.   Prescription sent to pharmacy for Wellbutrin.

## 2018-09-27 ENCOUNTER — Other Ambulatory Visit: Payer: Self-pay | Admitting: Family Medicine

## 2018-09-27 NOTE — Telephone Encounter (Signed)
Ok to refill??  Last office visit 08/16/2018.  Last refill 07/28/2018.

## 2018-09-27 NOTE — Telephone Encounter (Signed)
Requesting refill    Ambien  LOV: 08/16/18  LRF:  07/28/18

## 2018-10-24 DIAGNOSIS — M19039 Primary osteoarthritis, unspecified wrist: Secondary | ICD-10-CM | POA: Diagnosis not present

## 2018-10-24 DIAGNOSIS — M6740 Ganglion, unspecified site: Secondary | ICD-10-CM | POA: Diagnosis not present

## 2018-10-24 DIAGNOSIS — M25531 Pain in right wrist: Secondary | ICD-10-CM | POA: Diagnosis not present

## 2019-01-02 ENCOUNTER — Ambulatory Visit (INDEPENDENT_AMBULATORY_CARE_PROVIDER_SITE_OTHER): Payer: Medicare HMO

## 2019-01-02 ENCOUNTER — Other Ambulatory Visit: Payer: Self-pay | Admitting: Family Medicine

## 2019-01-02 ENCOUNTER — Other Ambulatory Visit: Payer: Medicare HMO

## 2019-01-02 ENCOUNTER — Other Ambulatory Visit: Payer: Self-pay

## 2019-01-02 DIAGNOSIS — Z23 Encounter for immunization: Secondary | ICD-10-CM

## 2019-01-02 DIAGNOSIS — E039 Hypothyroidism, unspecified: Secondary | ICD-10-CM

## 2019-01-03 LAB — TSH: TSH: 2.69 mIU/L (ref 0.40–4.50)

## 2019-01-10 ENCOUNTER — Other Ambulatory Visit: Payer: Self-pay | Admitting: Family Medicine

## 2019-01-10 DIAGNOSIS — Z1231 Encounter for screening mammogram for malignant neoplasm of breast: Secondary | ICD-10-CM

## 2019-01-23 ENCOUNTER — Other Ambulatory Visit: Payer: Self-pay | Admitting: Family Medicine

## 2019-01-23 NOTE — Telephone Encounter (Signed)
Last office visit: 08/16/2018 Last refilled: 09/27/2018

## 2019-02-15 ENCOUNTER — Other Ambulatory Visit: Payer: Self-pay

## 2019-02-16 ENCOUNTER — Telehealth: Payer: Self-pay | Admitting: Family Medicine

## 2019-02-16 ENCOUNTER — Encounter: Payer: Self-pay | Admitting: Family Medicine

## 2019-02-16 ENCOUNTER — Ambulatory Visit (INDEPENDENT_AMBULATORY_CARE_PROVIDER_SITE_OTHER): Payer: Medicare HMO | Admitting: Family Medicine

## 2019-02-16 VITALS — BP 130/60 | HR 86 | Temp 96.5°F | Resp 16 | Ht 68.0 in | Wt 193.0 lb

## 2019-02-16 DIAGNOSIS — E7800 Pure hypercholesterolemia, unspecified: Secondary | ICD-10-CM | POA: Diagnosis not present

## 2019-02-16 DIAGNOSIS — J441 Chronic obstructive pulmonary disease with (acute) exacerbation: Secondary | ICD-10-CM

## 2019-02-16 DIAGNOSIS — Z0001 Encounter for general adult medical examination with abnormal findings: Secondary | ICD-10-CM

## 2019-02-16 DIAGNOSIS — J431 Panlobular emphysema: Secondary | ICD-10-CM

## 2019-02-16 DIAGNOSIS — M8580 Other specified disorders of bone density and structure, unspecified site: Secondary | ICD-10-CM | POA: Diagnosis not present

## 2019-02-16 DIAGNOSIS — E039 Hypothyroidism, unspecified: Secondary | ICD-10-CM

## 2019-02-16 DIAGNOSIS — Z0000 Encounter for general adult medical examination without abnormal findings: Secondary | ICD-10-CM

## 2019-02-16 NOTE — Progress Notes (Signed)
Subjective:    Patient ID: [name] [name], female    DOB: 10/18/1950, 68 y.o.   MRN: [id number]  HPI  Patient is a very pleasant 68 year old Caucasian female who normally sees Dr. Buelah Manis however there was a mistake on the schedule and so today I am seeing her for her physical.  She denies any specific concerns.  She has scheduled her mammogram for later this year. Last mammogram 11/19 Last colonoscopy 2018 (3 polyps) S/P hysterectomy DEXA 2018 (T= -1.5)  Review of her immunizations so that she has had her flu shot this year.  She had Prevnar 13 in 2018 however she is overdue for a booster on Pneumovax 23.  I also recommended the shingles vaccine at her earliest convenience.  Patient has a greater than 30-pack-year history of smoking and therefore she is also due for a CT scan of the lungs to screen for lung cancer.  Her mother died from lung cancer.  The patient continues to smoke.  We had a long discussion today about the pros and cons of screening with a CAT scan.  The patient would like to think about this more prior to scheduling the CAT scan. Past Medical History:  Diagnosis Date  . Anxiety   . Arthritis   . 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 Cancer ([place])    cervix  . Chronic sinusitis    [facility]  . COPD (chronic obstructive pulmonary disease) ([place])    PFT 2011  . Depression   . Hypothyroidism   . Keratoconjunctivitis    Right eye  . Thyroid disease    Grave disease   Past Surgical History:  Procedure Laterality Date  . ABDOMINAL HYSTERECTOMY     cervical cancer  . BREAST LUMPECTOMY Right 11/25/2006   malignant  . BREAST SURGERY    . COLONOSCOPY N/A 12/15/2016   Procedure: COLONOSCOPY;  Surgeon: Rogene Houston, MD;  Location: [facility];  Service: Endoscopy;  Laterality: N/A;  930  . ECTOPIC PREGNANCY SURGERY    . POLYPECTOMY  12/15/2016   Procedure: POLYPECTOMY;  Surgeon: Rogene Houston, MD;  Location: [facility];  Service: Endoscopy;;   ascending, cecal, recto-sigmoid  . TOTAL HIP ARTHROPLASTY Left    Current Outpatient Medications on File Prior to Visit  Medication Sig Dispense Refill  . albuterol (PROVENTIL HFA;VENTOLIN HFA) 108 (90 Base) MCG/ACT inhaler Inhale 2 puffs into the lungs every 6 (six) hours as needed for wheezing or shortness of breath. 1 Inhaler 1  . atorvastatin (LIPITOR) 40 MG tablet Take 1 tablet (40 mg total) by mouth daily. 90 tablet 3  . buPROPion (WELLBUTRIN SR) 150 MG 12 hr tablet Take 1 tablet (150 mg total) by mouth 2 (two) times daily. 60 tablet 1  . levothyroxine (SYNTHROID) 50 MCG tablet Take 1 tablet (50 mcg total) by mouth daily. 90 tablet 3  . traMADol (ULTRAM) 50 MG tablet TAKE 1 TABLET BY MOUTH EVERY 8 HOURS AS NEEDED 90 tablet 0  . zolpidem (AMBIEN) 10 MG tablet TAKE 1 TABLET BY MOUTH AT BEDTIME AS NEEDED FOR SLEEP 30 tablet 2   No current facility-administered medications on file prior to visit.    Allergies  Allergen Reactions  . Ibuprofen Hives  . Naproxen Sodium Hives  . Aspirin Hives  . Latex Rash  . Vicodin [Hydrocodone-Acetaminophen] Hives  . Hydrocodeine [Dihydrocodeine] Rash   Social History   Socioeconomic History  . Marital status: Married  Spouse name: Not on file  . Number of children: Not on file  . Years of education: Not on file  . Highest education level: Not on file  Occupational History  . Not on file  Social Needs  . Financial resource strain: Not on file  . Food insecurity    Worry: Not on file    Inability: Not on file  . Transportation needs    Medical: Not on file    Non-medical: Not on file  Tobacco Use  . Smoking status: Current Every Day Smoker    Packs/day: 1.50    Years: 40.00    Pack years: 60.00  . Smokeless tobacco: Never Used  . Tobacco comment: quit 11/2013  Substance and Sexual Activity  . Alcohol use: Yes    Alcohol/week: 0.0 standard drinks    Comment: 1/month  . Drug use: No  . Sexual activity: Not Currently    Birth  control/protection: Surgical  Lifestyle  . Physical activity    Days per week: Not on file    Minutes per session: Not on file  . Stress: Not on file  Relationships  . Social Herbalist on phone: Not on file    Gets together: Not on file    Attends religious service: Not on file    Active member of club or organization: Not on file    Attends meetings of clubs or organizations: Not on file    Relationship status: Not on file  . Intimate partner violence    Fear of current or ex partner: Not on file    Emotionally abused: Not on file    Physically abused: Not on file    Forced sexual activity: Not on file  Other Topics Concern  . Not on file  Social History Narrative  . Not on file   Family History  Problem Relation Age of Onset  . Cancer Mother        lung  . Cancer Sister        ?lymphoma  . Diabetes Maternal Grandmother   . Dementia Father   . Colon cancer Neg Hx        Review of Systems  All other systems reviewed and are negative.      Objective:   Physical Exam Vitals signs reviewed.  Constitutional:      General: She is not in acute distress.    Appearance: She is obese. She is not ill-appearing, toxic-appearing or diaphoretic.  HENT:     Head: Normocephalic and atraumatic.     Right Ear: Tympanic membrane and ear canal normal. There is no impacted cerumen.     Left Ear: Tympanic membrane and ear canal normal. There is no impacted cerumen.     Nose: Nose normal. No congestion or rhinorrhea.     Mouth/Throat:     Mouth: Mucous membranes are moist.     Pharynx: Oropharynx is clear. No oropharyngeal exudate or posterior oropharyngeal erythema.  Eyes:     Extraocular Movements: Extraocular movements intact.     Conjunctiva/sclera: Conjunctivae normal.     Pupils: Pupils are equal, round, and reactive to light.  Neck:     Musculoskeletal: Normal range of motion and neck supple. No neck rigidity or muscular tenderness.     Vascular: No carotid  bruit.  Cardiovascular:     Rate and Rhythm: Normal rate and regular rhythm.     Pulses: Normal pulses.     Heart sounds: Normal heart  sounds. No murmur. No friction rub. No gallop.   Pulmonary:     Effort: Pulmonary effort is normal. No respiratory distress.     Breath sounds: No stridor. Wheezing present. No rhonchi or rales.  Chest:     Chest wall: No tenderness.  Abdominal:     General: Abdomen is flat. Bowel sounds are normal. There is no distension.     Palpations: Abdomen is soft. There is no mass.     Tenderness: There is no abdominal tenderness. There is no guarding or rebound.     Hernia: No hernia is present.  Musculoskeletal:     Right lower leg: No edema.     Left lower leg: No edema.  Lymphadenopathy:     Cervical: No cervical adenopathy.  Skin:    General: Skin is warm.     Capillary Refill: Capillary refill takes less than 2 seconds.     Coloration: Skin is not jaundiced or pale.     Findings: No bruising, erythema, lesion or rash.  Neurological:     General: No focal deficit present.     Mental Status: She is alert and oriented to person, place, and time.     Cranial Nerves: No cranial nerve deficit.     Sensory: No sensory deficit.     Motor: No weakness.     Coordination: Coordination normal.     Gait: Gait normal.     Deep Tendon Reflexes: Reflexes normal.  Psychiatric:        Mood and Affect: Mood normal.        Behavior: Behavior normal.        Thought Content: Thought content normal.        Judgment: Judgment normal.           Assessment & Plan:  General medical exam  Hypothyroidism, unspecified type  Panlobular emphysema ([place])  Osteopenia, unspecified location  Patient's last bone density was in 2018 and showed osteopenia with a T score of -1.5.  I would recommend repeating the bone density test next year.  I did recommend 1200 mg a day of calcium and 1000 units a day of vitamin D.  Her colonoscopy is up-to-date and she is scheduled her  mammogram for later this year.  We discussed a CT scan of the lungs to screen for lung cancer.  The patient decides that she would like to think about this first.  She will contact my office if she decides she wants [place] to schedule her for the CAT scan.  Her exam today is significant for mild expiratory wheezing and diminished pulses in both feet.  Therefore she clearly has an element of emphysema.  She also may have mild peripheral vascular disease which is asymptomatic.  I strongly encouraged the patient to quit smoking but she is in the precontemplative phase.  Her blood pressure today is excellent.  I would like her to return fasting for CBC, CMP, and fasting lipid panel.

## 2019-02-16 NOTE — Telephone Encounter (Signed)
Patient was here today to see dr pickard for her cpe, however just wanted to talk to you in general about the visit with him [phone]

## 2019-02-19 NOTE — Telephone Encounter (Signed)
Call placed to patient. LMTRC.  

## 2019-02-19 NOTE — Telephone Encounter (Signed)
Please call patient and see what her concern is?

## 2019-02-20 ENCOUNTER — Telehealth: Payer: Self-pay | Admitting: Family Medicine

## 2019-02-20 NOTE — Telephone Encounter (Signed)
Call placed to patient. LMTRC.  

## 2019-02-20 NOTE — Telephone Encounter (Signed)
Call placed to patient.   Patient requires fasting labs to complete CPE. Inquired as to if she can have labs drawn D/T recent prednisone use. MD made aware and recommended getting labs drawn next week.

## 2019-02-20 NOTE — Telephone Encounter (Signed)
Call placed to patient.   Reports that she discussed with MD CT of lungs for cancer screening. Inquired as to if PCP agrees with imaging.   MD made aware and agreeable to imaging.   Orders placed.

## 2019-02-20 NOTE — Telephone Encounter (Addendum)
Patient calling to see if she can do  Her bloodwork while she is taking prednisone  [id number]

## 2019-02-27 ENCOUNTER — Other Ambulatory Visit: Payer: Self-pay | Admitting: Family Medicine

## 2019-02-27 ENCOUNTER — Other Ambulatory Visit: Payer: Self-pay

## 2019-02-27 ENCOUNTER — Other Ambulatory Visit: Payer: Medicare HMO

## 2019-02-27 DIAGNOSIS — E7800 Pure hypercholesterolemia, unspecified: Secondary | ICD-10-CM | POA: Diagnosis not present

## 2019-02-27 DIAGNOSIS — J431 Panlobular emphysema: Secondary | ICD-10-CM | POA: Diagnosis not present

## 2019-02-27 DIAGNOSIS — F17200 Nicotine dependence, unspecified, uncomplicated: Secondary | ICD-10-CM

## 2019-02-27 DIAGNOSIS — E039 Hypothyroidism, unspecified: Secondary | ICD-10-CM | POA: Diagnosis not present

## 2019-02-27 DIAGNOSIS — Z0000 Encounter for general adult medical examination without abnormal findings: Secondary | ICD-10-CM | POA: Diagnosis not present

## 2019-02-27 DIAGNOSIS — M8580 Other specified disorders of bone density and structure, unspecified site: Secondary | ICD-10-CM | POA: Diagnosis not present

## 2019-02-28 LAB — CBC WITH DIFFERENTIAL/PLATELET
Absolute Monocytes: 825 cells/uL (ref 200–950)
Basophils Absolute: 9 cells/uL (ref 0–200)
Basophils Relative: 0.1 %
Eosinophils Absolute: 0 cells/uL — ABNORMAL LOW (ref 15–500)
Eosinophils Relative: 0 %
HCT: 41.5 % (ref 35.0–45.0)
Hemoglobin: 14 g/dL (ref 11.7–15.5)
Lymphs Abs: 2015 cells/uL (ref 850–3900)
MCH: 30.2 pg (ref 27.0–33.0)
MCHC: 33.7 g/dL (ref 32.0–36.0)
MCV: 89.6 fL (ref 80.0–100.0)
MPV: 10.8 fL (ref 7.5–12.5)
Monocytes Relative: 9.7 %
Neutro Abs: 5653 cells/uL (ref 1500–7800)
Neutrophils Relative %: 66.5 %
Platelets: 266 10*3/uL (ref 140–400)
RBC: 4.63 10*6/uL (ref 3.80–5.10)
RDW: 13.3 % (ref 11.0–15.0)
Total Lymphocyte: 23.7 %
WBC: 8.5 10*3/uL (ref 3.8–10.8)

## 2019-02-28 LAB — COMPLETE METABOLIC PANEL WITH GFR
AG Ratio: 1.6 (calc) (ref 1.0–2.5)
ALT: 18 U/L (ref 6–29)
AST: 15 U/L (ref 10–35)
Albumin: 4 g/dL (ref 3.6–5.1)
Alkaline phosphatase (APISO): 80 U/L (ref 37–153)
BUN: 13 mg/dL (ref 7–25)
CO2: 31 mmol/L (ref 20–32)
Calcium: 8.8 mg/dL (ref 8.6–10.4)
Chloride: 104 mmol/L (ref 98–110)
Creat: 0.72 mg/dL (ref 0.50–0.99)
GFR, Est African American: 99 mL/min/{1.73_m2} (ref >=60)
GFR, Est Non African American: 85 mL/min/{1.73_m2} (ref >=60)
Globulin: 2.5 g/dL (calc) (ref 1.9–3.7)
Glucose, Bld: 108 mg/dL — ABNORMAL HIGH (ref 65–99)
Potassium: 4.1 mmol/L (ref 3.5–5.3)
Sodium: 141 mmol/L (ref 135–146)
Total Bilirubin: 0.4 mg/dL (ref 0.2–1.2)
Total Protein: 6.5 g/dL (ref 6.1–8.1)

## 2019-02-28 LAB — LIPID PANEL
Cholesterol: 163 mg/dL (ref <200)
HDL: 45 mg/dL — ABNORMAL LOW (ref >=50)
LDL Cholesterol (Calc): 91 mg/dL (calc)
Non-HDL Cholesterol (Calc): 118 mg/dL (calc) (ref <130)
Total CHOL/HDL Ratio: 3.6 (calc) (ref <5.0)
Triglycerides: 168 mg/dL — ABNORMAL HIGH (ref <150)

## 2019-03-01 ENCOUNTER — Other Ambulatory Visit: Payer: Self-pay

## 2019-03-01 ENCOUNTER — Ambulatory Visit
Admission: RE | Admit: 2019-03-01 | Discharge: 2019-03-01 | Disposition: A | Discharge status: Home / Self Care | Payer: Medicare HMO | Source: Ambulatory Visit | Attending: Family Medicine | Admitting: Family Medicine

## 2019-03-01 ENCOUNTER — Other Ambulatory Visit: Payer: Medicare HMO

## 2019-03-01 DIAGNOSIS — Z1231 Encounter for screening mammogram for malignant neoplasm of breast: Secondary | ICD-10-CM

## 2019-03-02 ENCOUNTER — Ambulatory Visit
Admission: RE | Admit: 2019-03-02 | Discharge: 2019-03-02 | Disposition: A | Discharge status: Home / Self Care | Payer: Medicare HMO | Source: Ambulatory Visit | Attending: Family Medicine | Admitting: Family Medicine

## 2019-03-02 ENCOUNTER — Encounter: Payer: Self-pay | Admitting: Family Medicine

## 2019-03-02 DIAGNOSIS — I700 Atherosclerosis of aorta: Secondary | ICD-10-CM | POA: Insufficient documentation

## 2019-03-02 DIAGNOSIS — Z87891 Personal history of nicotine dependence: Secondary | ICD-10-CM | POA: Diagnosis not present

## 2019-03-02 DIAGNOSIS — F17200 Nicotine dependence, unspecified, uncomplicated: Secondary | ICD-10-CM

## 2019-04-02 DIAGNOSIS — H47233 Glaucomatous optic atrophy, bilateral: Secondary | ICD-10-CM | POA: Diagnosis not present

## 2019-04-02 DIAGNOSIS — H5203 Hypermetropia, bilateral: Secondary | ICD-10-CM | POA: Diagnosis not present

## 2019-04-10 DIAGNOSIS — H11433 Conjunctival hyperemia, bilateral: Secondary | ICD-10-CM | POA: Diagnosis not present

## 2019-04-10 DIAGNOSIS — H05253 Intermittent exophthalmos, bilateral: Secondary | ICD-10-CM | POA: Diagnosis not present

## 2019-04-18 DIAGNOSIS — Z0100 Encounter for examination of eyes and vision without abnormal findings: Secondary | ICD-10-CM | POA: Diagnosis not present

## 2019-04-23 ENCOUNTER — Other Ambulatory Visit: Payer: Self-pay | Admitting: Family Medicine

## 2019-04-23 NOTE — Telephone Encounter (Signed)
Ok to refill??  Last office visit 08/16/2018.  Last refill 01/23/2019, #2 refills.

## 2019-07-17 ENCOUNTER — Other Ambulatory Visit: Payer: Self-pay | Admitting: Family Medicine

## 2019-07-17 NOTE — Telephone Encounter (Signed)
Ok to refill??  Last office visit 09/12/2018.  Last refill 04/23/2019, #2 refills.

## 2019-07-25 ENCOUNTER — Ambulatory Visit (HOSPITAL_COMMUNITY)
Admission: RE | Admit: 2019-07-25 | Discharge: 2019-07-25 | Disposition: A | Discharge status: Home / Self Care | Payer: Medicare HMO | Source: Ambulatory Visit | Attending: Family Medicine | Admitting: Family Medicine

## 2019-07-25 ENCOUNTER — Ambulatory Visit (INDEPENDENT_AMBULATORY_CARE_PROVIDER_SITE_OTHER): Payer: Medicare HMO | Admitting: Family Medicine

## 2019-07-25 ENCOUNTER — Other Ambulatory Visit: Payer: Self-pay

## 2019-07-25 ENCOUNTER — Encounter: Payer: Self-pay | Admitting: Family Medicine

## 2019-07-25 VITALS — BP 148/74 | HR 92 | Temp 98.2°F | Resp 16 | Ht 68.0 in | Wt 204.0 lb

## 2019-07-25 DIAGNOSIS — R1030 Lower abdominal pain, unspecified: Secondary | ICD-10-CM | POA: Diagnosis not present

## 2019-07-25 DIAGNOSIS — R600 Localized edema: Secondary | ICD-10-CM | POA: Diagnosis not present

## 2019-07-25 DIAGNOSIS — M1612 Unilateral primary osteoarthritis, left hip: Secondary | ICD-10-CM | POA: Diagnosis not present

## 2019-07-25 DIAGNOSIS — N390 Urinary tract infection, site not specified: Secondary | ICD-10-CM | POA: Diagnosis not present

## 2019-07-25 DIAGNOSIS — E039 Hypothyroidism, unspecified: Secondary | ICD-10-CM

## 2019-07-25 DIAGNOSIS — M7989 Other specified soft tissue disorders: Secondary | ICD-10-CM

## 2019-07-25 DIAGNOSIS — R2242 Localized swelling, mass and lump, left lower limb: Secondary | ICD-10-CM

## 2019-07-25 LAB — URINALYSIS, ROUTINE W REFLEX MICROSCOPIC
Bilirubin Urine: NEGATIVE
Glucose, UA: NEGATIVE
Hyaline Cast: NONE SEEN /LPF
Ketones, ur: NEGATIVE
Nitrite: NEGATIVE
Protein, ur: NEGATIVE
Specific Gravity, Urine: 1.015 (ref 1.001–1.03)
pH: 6.5 (ref 5.0–8.0)

## 2019-07-25 LAB — MICROSCOPIC MESSAGE

## 2019-07-25 MED ORDER — TRAMADOL HCL 50 MG PO TABS: 50.0000 mg | ORAL_TABLET | Freq: Three times a day (TID) | ORAL | 0 refills | Status: DC | PRN

## 2019-07-25 MED ORDER — FUROSEMIDE 20 MG PO TABS
20.0000 mg | ORAL_TABLET | Freq: Every day | ORAL | 0 refills | Status: DC | PRN
Start: 2019-07-25 — End: 2019-12-24

## 2019-07-25 MED ORDER — NITROFURANTOIN MONOHYD MACRO 100 MG PO CAPS
100.0000 mg | ORAL_CAPSULE | Freq: Two times a day (BID) | ORAL | 0 refills | Status: DC
Start: 2019-07-25 — End: 2019-08-14

## 2019-07-25 NOTE — Assessment & Plan Note (Addendum)
Hypothyroidism we will check thyroid function studies ensure her dose is correct.    I am concerned that she has unilateral leg swelling so we will send her for urgent ultrasound to rule out DVT.  We will also plan to start Lasix 20 mg once a day for fluid retention.  She does have DVT today we will start anticoagulation appropriately.  Abdominal discomfort she is not getting any specific signs of acute infection or vaginitis.  Just feels tightness and pulling in her lower abdomen she had a benign abdominal exam.  Clearly some of the swelling in the leg is causing the discomfort.  Urinalysis obtained to check for infection

## 2019-07-25 NOTE — Assessment & Plan Note (Signed)
Chronic back and hip pain, no OA Refilled ultram

## 2019-07-25 NOTE — Addendum Note (Signed)
Addended by: Vic Blackbird F on: 07/25/2019 04:39 PM   Modules accepted: Orders

## 2019-07-25 NOTE — Patient Instructions (Addendum)
We will call with results Take lasix once a day  Ultrasound to be done

## 2019-07-25 NOTE — Progress Notes (Addendum)
Subjective:    Patient ID: [name] [name], female    DOB: 1950/07/07, 69 y.o.   MRN: [id number]  Patient presents for Edema (x4 weeks- swelling in BLE, tightness in lower abd, pelvis)     Pt here with swelling in left leg for the past 3-4 weeks, no injury. She went to the beach this past weekend but leg was swollen before she went to beach She has tried elevating the legs some but there has been no change in the swelling.  It is painful to touch she feels like the swelling goes all the way up to her groin region.  Has not noted any color change to the skin.  He does have pain when she tries to walk.   In the past week, she has noticed pressure in her lower abdomen and pelvis, she feels like she going to have a baby. Denies any urinary symptoms the discomfort is mostly in the lower abdomen she denies any pelvic pain No vaginal discharge or bleeding, denies any feeling of prolapse  no change in bowels  No vomiting episodes She does not recall any injury to her lower abdomen She has also had increased back pain the past week as well feels more stiffness.  She has been taking tylenol, does not have ultram at home   Weight up 11lbs in the past few months she also admits that she has not been following proper diet.  Hypothyroidism- last lab in Oct due for recheck  Review Of Systems:  GEN- denies fatigue, fever, weight loss,weakness, recent illness HEENT- denies eye drainage, change in vision, nasal discharge, CVS- denies chest pain, palpitations RESP- denies SOB, cough, wheeze ABD- denies N/V, change in stools, abd pain GU- denies dysuria, hematuria, dribbling, incontinence MSK- + joint pain, muscle aches, injury Neuro- denies headache, dizziness, syncope, seizure activity       Objective:    BP (!) 148/74   Pulse 92   Temp 98.2 F (36.8 C) (Temporal)   Resp 16   Ht 5\' 8"  (1.727 m)   Wt 204 lb (92.5 kg)   SpO2 96%   BMI 31.02 kg/m  GEN- NAD, alert and oriented  x3 HEENT- PERRL, EOMI, non injected sclera, pink conjunctiva, MMM, oropharynx clear Neck- Supple,  CVS- RRR, no murmur RESP-CTAB ABD-NABS,soft,NT,ND, no CVA tenderness MSK- fair ROM bilat hips, Spine,  knees, no knee effusion noted  , mild TTP lumbar spine, no spasm noted ,neg SLR EXT- 1+ pitting LLE edema to thigh, TTP palptation, no erythema, ,RLE no edema  Pulses- Radial, DP- palpated         Assessment & Plan:      Problem List Items Addressed This Visit      Unprioritized   Hypothyroidism    Hypothyroidism we will check thyroid function studies ensure her dose is correct.    I am concerned that she has unilateral leg swelling so we will send her for urgent ultrasound to rule out DVT.  We will also plan to start Lasix 20 mg once a day for fluid retention.  She does have DVT today we will start anticoagulation appropriately.  Abdominal discomfort she is not getting any specific signs of acute infection or vaginitis.  Just feels tightness and pulling in her lower abdomen she had a benign abdominal exam.  Clearly some of the swelling in the leg is causing the discomfort.  Urinalysis obtained to check for infection      Relevant Orders   CBC with  Differential/Platelet   Comprehensive metabolic panel   TSH   T3, free   OA (osteoarthritis) of hip    Chronic back and hip pain, no OA Refilled ultram       Relevant Medications   traMADol (ULTRAM) 50 MG tablet    Other Visit Diagnoses    Left leg swelling    -  Primary  Addendum ultrasound does not show DVT but she has a mass in the popliteal region possible hematoma or cyst or other soft tissue mass.  Recommended CT or MRI be done for further evaluation.   Relevant Orders   CBC with Differential/Platelet   Comprehensive metabolic panel   US Venous Img Lower Unilateral Left (Completed)   Lower abdominal pain       Relevant Orders   Urinalysis, Routine w reflex microscopic (Completed)   Urinary tract infection without  hematuria, site unspecified       Macrobid for infection   Relevant Medications   nitrofurantoin, macrocrystal-monohydrate, (MACROBID) 100 MG capsule      Note: This dictation was prepared with Dragon dictation along with smaller phrase technology. Any transcriptional errors that result from this process are unintentional.

## 2019-07-25 NOTE — Addendum Note (Signed)
Addended by: Vic Blackbird F on: 07/25/2019 04:58 PM   Modules accepted: Orders

## 2019-07-26 ENCOUNTER — Other Ambulatory Visit: Payer: Self-pay | Admitting: *Deleted

## 2019-07-26 ENCOUNTER — Other Ambulatory Visit: Payer: Self-pay | Admitting: Family Medicine

## 2019-07-26 DIAGNOSIS — M7989 Other specified soft tissue disorders: Secondary | ICD-10-CM

## 2019-07-26 DIAGNOSIS — E039 Hypothyroidism, unspecified: Secondary | ICD-10-CM

## 2019-07-26 DIAGNOSIS — R2242 Localized swelling, mass and lump, left lower limb: Secondary | ICD-10-CM

## 2019-07-26 LAB — CBC WITH DIFFERENTIAL/PLATELET
Absolute Monocytes: 691 cells/uL (ref 200–950)
Basophils Absolute: 7 cells/uL (ref 0–200)
Basophils Relative: 0.1 %
Eosinophils Absolute: 0 cells/uL — ABNORMAL LOW (ref 15–500)
Eosinophils Relative: 0 %
HCT: 44.6 % (ref 35.0–45.0)
Hemoglobin: 14.9 g/dL (ref 11.7–15.5)
Lymphs Abs: 2124 cells/uL (ref 850–3900)
MCH: 30.6 pg (ref 27.0–33.0)
MCHC: 33.4 g/dL (ref 32.0–36.0)
MCV: 91.6 fL (ref 80.0–100.0)
MPV: 11.6 fL (ref 7.5–12.5)
Monocytes Relative: 9.6 %
Neutro Abs: 4378 cells/uL (ref 1500–7800)
Neutrophils Relative %: 60.8 %
Platelets: 254 10*3/uL (ref 140–400)
RBC: 4.87 10*6/uL (ref 3.80–5.10)
RDW: 12.6 % (ref 11.0–15.0)
Total Lymphocyte: 29.5 %
WBC: 7.2 10*3/uL (ref 3.8–10.8)

## 2019-07-26 LAB — COMPREHENSIVE METABOLIC PANEL
AG Ratio: 1.5 (calc) (ref 1.0–2.5)
ALT: 12 U/L (ref 6–29)
AST: 16 U/L (ref 10–35)
Albumin: 4.4 g/dL (ref 3.6–5.1)
Alkaline phosphatase (APISO): 110 U/L (ref 37–153)
BUN: 10 mg/dL (ref 7–25)
CO2: 30 mmol/L (ref 20–32)
Calcium: 9.3 mg/dL (ref 8.6–10.4)
Chloride: 102 mmol/L (ref 98–110)
Creat: 0.67 mg/dL (ref 0.50–0.99)
Globulin: 2.9 g/dL (calc) (ref 1.9–3.7)
Glucose, Bld: 89 mg/dL (ref 65–99)
Potassium: 3.8 mmol/L (ref 3.5–5.3)
Sodium: 141 mmol/L (ref 135–146)
Total Bilirubin: 0.5 mg/dL (ref 0.2–1.2)
Total Protein: 7.3 g/dL (ref 6.1–8.1)

## 2019-07-26 LAB — T3, FREE: T3, Free: 3.3 pg/mL (ref 2.3–4.2)

## 2019-07-26 LAB — TSH: TSH: 5.65 mIU/L — ABNORMAL HIGH (ref 0.40–4.50)

## 2019-07-26 MED ORDER — LEVOTHYROXINE SODIUM 75 MCG PO TABS: 75.0000 ug | ORAL_TABLET | Freq: Every day | ORAL | 3 refills | Status: DC

## 2019-08-01 ENCOUNTER — Encounter: Payer: Self-pay | Admitting: Family Medicine

## 2019-08-01 NOTE — Progress Notes (Signed)
Pt needs appeal letter to get CT done for leg mass

## 2019-08-08 ENCOUNTER — Other Ambulatory Visit: Payer: Self-pay

## 2019-08-08 ENCOUNTER — Other Ambulatory Visit (HOSPITAL_COMMUNITY): Payer: Self-pay | Admitting: Hematology

## 2019-08-08 ENCOUNTER — Ambulatory Visit (HOSPITAL_COMMUNITY)
Admission: RE | Admit: 2019-08-08 | Discharge: 2019-08-08 | Disposition: A | Discharge status: Home / Self Care | Payer: Medicare HMO | Source: Ambulatory Visit | Attending: Family Medicine | Admitting: Family Medicine

## 2019-08-08 DIAGNOSIS — R2242 Localized swelling, mass and lump, left lower limb: Secondary | ICD-10-CM | POA: Insufficient documentation

## 2019-08-08 DIAGNOSIS — M7989 Other specified soft tissue disorders: Secondary | ICD-10-CM | POA: Insufficient documentation

## 2019-08-08 MED ORDER — IOHEXOL 300 MG/ML  SOLN
75.0000 mL | Freq: Once | INTRAMUSCULAR | Status: AC | PRN
  Administered 2019-08-08: 75 mL via INTRAVENOUS

## 2019-08-10 ENCOUNTER — Other Ambulatory Visit: Payer: Self-pay | Admitting: *Deleted

## 2019-08-10 MED ORDER — DOXYCYCLINE HYCLATE 100 MG PO TABS
100.0000 mg | ORAL_TABLET | Freq: Two times a day (BID) | ORAL | 0 refills | Status: DC
Start: 2019-08-10 — End: 2019-12-24

## 2019-08-14 ENCOUNTER — Encounter: Payer: Self-pay | Admitting: Family Medicine

## 2019-08-14 ENCOUNTER — Other Ambulatory Visit: Payer: Self-pay

## 2019-08-14 ENCOUNTER — Ambulatory Visit (INDEPENDENT_AMBULATORY_CARE_PROVIDER_SITE_OTHER): Payer: Medicare HMO | Admitting: Family Medicine

## 2019-08-14 VITALS — BP 130/64 | HR 80 | Temp 98.7°F | Resp 14 | Ht 68.0 in | Wt 200.0 lb

## 2019-08-14 DIAGNOSIS — L03116 Cellulitis of left lower limb: Secondary | ICD-10-CM

## 2019-08-14 DIAGNOSIS — M7989 Other specified soft tissue disorders: Secondary | ICD-10-CM | POA: Diagnosis not present

## 2019-08-14 DIAGNOSIS — E039 Hypothyroidism, unspecified: Secondary | ICD-10-CM

## 2019-08-14 DIAGNOSIS — Q278 Other specified congenital malformations of peripheral vascular system: Secondary | ICD-10-CM | POA: Diagnosis not present

## 2019-08-14 NOTE — Progress Notes (Signed)
   Subjective:    Patient ID: [name] [name], female    DOB: [birth year], 69 y.o.   MRN: [id number]  Patient presents for Follow-up (LLE Edema)  Pt here to f/u LLE edema Taking doxycycline since Friday and redness has improved and swelling has gone down. Leg pain itself also improved  She has not been taking the lasix at all, she was not sure about taking the medication until CT was obtained however insurance required authorization No DVT was found on [place], but mass in popliteal region was concerning- this would found to be a 87mm venous varix  which I discussed with pt    IMPRESSION: 1. Skin thickening and subcutaneous soft tissue swelling/edema/fluid mainly involving the lower aspect of the left leg suggesting cellulitis or possible venous congestion or CHF. 2. No findings suspicious for myofasciitis or pyomyositis. 3. No soft tissue abscess is identified. 4. No findings for septic arthritis or osteomyelitis. 5. 15 mm venous varix in the popliteal fossa likely accounting for the ultrasound abnormality.  Note UTI symptoms have resolved   Review Of Systems:  GEN- denies fatigue, fever, weight loss,weakness, recent illness HEENT- denies eye drainage, change in vision, nasal discharge, CVS- denies chest pain, palpitations RESP- denies SOB, cough, wheeze ABD- denies N/V, change in stools, abd pain MSK- denies joint pain, muscle aches, injury Neuro- denies headache, dizziness, syncope, seizure activity       Objective:    BP 130/64   Pulse 80   Temp 98.7 F (37.1 C) (Temporal)   Resp 14   Ht 5\' 8"  (1.727 m)   Wt 200 lb (90.7 kg)   SpO2 95%   BMI 30.41 kg/m  GEN- NAD, alert and oriented x3 CVS- RRR, no murmur RESP-CTAB EXT- RLE no edema, LLE mild swelling from ankle to mid shin, mild TTP, mild erythema  Pulses- Radial, DP- 2+        Assessment & Plan:      Problem List Items Addressed This Visit      Unprioritized   RESOLVED: Congenital venous varix   Hypothyroidism    She is taking higher dose of levothyroxine Return in July for recheck on TSH       Other Visit Diagnoses    Cellulitis of left leg    -  Primary   Improved with doxycycline , will continue, for the additional swelling recommend she take lasix for 1 week and elevate leg    Left leg swelling          Note: This dictation was prepared with Dragon dictation along with smaller phrase technology. Any transcriptional errors that result from this process are unintentional.

## 2019-08-14 NOTE — Patient Instructions (Addendum)
Complete all the antibiotics for cellulitis Take lasix in the morning for 1 week Continue levothyroxine 68mcg once a day  F/u 1st week of July for labs F/U 6 months for physical

## 2019-08-15 ENCOUNTER — Encounter: Payer: Self-pay | Admitting: Family Medicine

## 2019-08-15 DIAGNOSIS — Q278 Other specified congenital malformations of peripheral vascular system: Secondary | ICD-10-CM | POA: Insufficient documentation

## 2019-08-15 NOTE — Assessment & Plan Note (Signed)
She is taking higher dose of levothyroxine Return in July for recheck on TSH

## 2019-10-16 ENCOUNTER — Other Ambulatory Visit: Payer: Self-pay | Admitting: Family Medicine

## 2019-10-16 DIAGNOSIS — H47233 Glaucomatous optic atrophy, bilateral: Secondary | ICD-10-CM | POA: Diagnosis not present

## 2019-10-16 DIAGNOSIS — Z961 Presence of intraocular lens: Secondary | ICD-10-CM | POA: Diagnosis not present

## 2019-10-17 NOTE — Telephone Encounter (Signed)
Ok to refill??  Last office visit 08/14/2019.  Last refill 07/17/2019, #2 refills.

## 2019-11-07 ENCOUNTER — Telehealth: Payer: Self-pay | Admitting: Family Medicine

## 2019-11-07 DIAGNOSIS — E039 Hypothyroidism, unspecified: Secondary | ICD-10-CM

## 2019-11-07 MED ORDER — LEVOTHYROXINE SODIUM 75 MCG PO TABS: 75.0000 ug | ORAL_TABLET | Freq: Every day | ORAL | 1 refills | Status: DC

## 2019-11-07 NOTE — Telephone Encounter (Signed)
[facility] not able to fill her Levothyroxine?  would like to know if another medication can prescribe

## 2019-11-07 NOTE — Telephone Encounter (Signed)
Noted  

## 2019-11-07 NOTE — Telephone Encounter (Signed)
Call placed to pharmacy.   Was advised that they are no longer able to obtain [employer]upin manufactured Levothyroxine.   Requested VO to change to euthyrox.   New prescription sent to pharmacy with ok to change brand.   Patient made aware and advised to F/U in 6 weeks for TSH to make sure new medication is working effectively. Future lab orders placed.   Verbalized understanding.

## 2019-12-24 ENCOUNTER — Encounter: Payer: Self-pay | Admitting: Family Medicine

## 2019-12-24 ENCOUNTER — Other Ambulatory Visit: Payer: Self-pay

## 2019-12-24 ENCOUNTER — Ambulatory Visit (INDEPENDENT_AMBULATORY_CARE_PROVIDER_SITE_OTHER): Payer: Medicare HMO | Admitting: Family Medicine

## 2019-12-24 VITALS — BP 134/80 | HR 90 | Temp 98.1°F | Resp 14 | Ht 68.0 in | Wt 201.0 lb

## 2019-12-24 DIAGNOSIS — Q278 Other specified congenital malformations of peripheral vascular system: Secondary | ICD-10-CM

## 2019-12-24 DIAGNOSIS — Z23 Encounter for immunization: Secondary | ICD-10-CM

## 2019-12-24 DIAGNOSIS — E039 Hypothyroidism, unspecified: Secondary | ICD-10-CM | POA: Diagnosis not present

## 2019-12-24 DIAGNOSIS — R600 Localized edema: Secondary | ICD-10-CM

## 2019-12-24 DIAGNOSIS — R609 Edema, unspecified: Secondary | ICD-10-CM

## 2019-12-24 DIAGNOSIS — E0500 Thyrotoxicosis with diffuse goiter without thyrotoxic crisis or storm: Secondary | ICD-10-CM | POA: Diagnosis not present

## 2019-12-24 DIAGNOSIS — M1612 Unilateral primary osteoarthritis, left hip: Secondary | ICD-10-CM

## 2019-12-24 DIAGNOSIS — R7303 Prediabetes: Secondary | ICD-10-CM | POA: Diagnosis not present

## 2019-12-24 DIAGNOSIS — E782 Mixed hyperlipidemia: Secondary | ICD-10-CM

## 2019-12-24 MED ORDER — ZOLPIDEM TARTRATE 10 MG PO TABS: 10.0000 mg | ORAL_TABLET | Freq: Every evening | ORAL | 2 refills | Status: DC | PRN

## 2019-12-24 MED ORDER — TRAMADOL HCL 50 MG PO TABS: 50.0000 mg | ORAL_TABLET | Freq: Three times a day (TID) | ORAL | 0 refills | Status: DC | PRN

## 2019-12-24 MED ORDER — FUROSEMIDE 20 MG PO TABS: 20.0000 mg | ORAL_TABLET | Freq: Every day | ORAL | 1 refills | Status: DC | PRN

## 2019-12-24 NOTE — Patient Instructions (Addendum)
Wear compression hose Restart lasix once a day  Referral to vascular to evaluate your legs Flu shot given  F/U 4 months

## 2019-12-24 NOTE — Assessment & Plan Note (Signed)
Followed by eye doctor.  

## 2019-12-24 NOTE — Progress Notes (Signed)
Subjective:    Patient ID: [name] [name], female    DOB: 09-Oct-1950, 69 y.o.   MRN: [id number]  Patient presents for L LE Edema (swelling and pain in LLE, has hard knot like areas to leg)  Patient here for recurrent left leg swelling.  She was seen back in May she had ultrasound done which was concerning for hypoechoic mass in the popliteal region.  Also has swelling of the leg.  There was no DVT.  CT scan was obtained IMPRESSION: 1. Skin thickening and subcutaneous soft tissue swelling/edema/fluid mainly involving the lower aspect of the left leg suggesting cellulitis or possible venous congestion or CHF. 2. No findings suspicious for myofasciitis or pyomyositis. 3. No soft tissue abscess is identified. 4. No findings for septic arthritis or osteomyelitis. 5. 15 mm venous varix in the popliteal fossa likely accounting for the ultrasound abnormality.   She was given Lasix 20 mg to use as needed.  She was also treated for probable cellulitis based on the CT scan findings with doxycycline. When she stopped the lasix, the nodules came back up with the swelling   Hypothyroidism she is due for recheck her TSH her levothyroxine was increased to 75 mcg back in May.  Review Of Systems:  GEN- denies fatigue, fever, weight loss,weakness, recent illness HEENT- denies eye drainage, change in vision, nasal discharge, CVS- denies chest pain, palpitations RESP- denies SOB, cough, wheeze ABD- denies N/V, change in stools, abd pain GU- denies dysuria, hematuria, dribbling, incontinence MSK- denies joint pain, muscle aches, injury Neuro- denies headache, dizziness, syncope, seizure activity       Objective:    BP 134/80   Pulse 90   Temp 98.1 F (36.7 C) (Temporal)   Resp 14   Ht 5\' 8"  (1.727 m)   Wt 201 lb (91.2 kg)   SpO2 93%   BMI 30.56 kg/m  GEN- NAD, alert and oriented x3 HEENT- PERRL, EOMI, non injected sclera, pink conjunctiva, MMM, oropharynx clear, mild buldge bilat  eyes Neck- Supple, no thyromegaly CVS- RRR, no murmur RESP-CTAB ABD-NABS,soft,NT,ND EXT- Left leg 3-4 scattered pockets of swelling on shin, calf, side of leg, mild TTP, no erythema, some harder than other, neg homans, Walks with cane  Pulses- Radial, DP- 2+        Assessment & Plan:      Problem List Items Addressed This Visit      Unprioritized   Borderline diabetes   Relevant Orders   Hemoglobin A1c   Congenital venous varix    Patient with congenital venous varix but also pockets of fluid which were seen on the CT scan.  She had she probably states she has had recurrence of the fluid pocket.  We will get an appointment with vascular due to the atypical presentation with the fluid pocket.  She does not have gross lymphedema.  We will restart Lasix 20 mg once a day check renal function.  Have also given her prescription for compression hose.      Relevant Medications   furosemide (LASIX) 20 MG tablet   Other Relevant Orders   Ambulatory referral to Vascular Surgery   Graves' eye disease    Followed by eye doctor      Relevant Orders   Comprehensive metabolic panel   CBC with Differential/Platelet   Hyperlipidemia   Relevant Medications   furosemide (LASIX) 20 MG tablet   Other Relevant Orders   Comprehensive metabolic panel   CBC with Differential/Platelet   Lipid panel  Hypothyroidism - Primary    Recheck TFT adjust as needed       Relevant Orders   TSH   T4, free   T3, free   OA (osteoarthritis) of hip    Ultram refilled       Relevant Medications   traMADol (ULTRAM) 50 MG tablet    Other Visit Diagnoses    Peripheral edema       Relevant Orders   Ambulatory referral to Vascular Surgery      Note: This dictation was prepared with Dragon dictation along with smaller phrase technology. Any transcriptional errors that result from this process are unintentional.

## 2019-12-24 NOTE — Assessment & Plan Note (Signed)
Recheck TFT adjust as needed 

## 2019-12-24 NOTE — Assessment & Plan Note (Signed)
Ultram refilled.

## 2019-12-24 NOTE — Assessment & Plan Note (Addendum)
Patient with congenital venous varix but also pockets of fluid which were seen on the CT scan.  She had she probably states she has had recurrence of the fluid pocket.  We will get an appointment with vascular due to the atypical presentation with the fluid pocket.  She does not have gross lymphedema.  We will restart Lasix 20 mg once a day check renal function.  Have also given her prescription for compression hose.

## 2019-12-25 LAB — LIPID PANEL
Cholesterol: 167 mg/dL (ref <200)
HDL: 41 mg/dL — ABNORMAL LOW (ref >=50)
LDL Cholesterol (Calc): 105 mg/dL (calc) — ABNORMAL HIGH
Non-HDL Cholesterol (Calc): 126 mg/dL (calc) (ref <130)
Total CHOL/HDL Ratio: 4.1 (calc) (ref <5.0)
Triglycerides: 116 mg/dL (ref <150)

## 2019-12-25 LAB — COMPREHENSIVE METABOLIC PANEL
AG Ratio: 1.6 (calc) (ref 1.0–2.5)
ALT: 11 U/L (ref 6–29)
AST: 13 U/L (ref 10–35)
Albumin: 4.2 g/dL (ref 3.6–5.1)
Alkaline phosphatase (APISO): 103 U/L (ref 37–153)
BUN: 13 mg/dL (ref 7–25)
CO2: 29 mmol/L (ref 20–32)
Calcium: 9 mg/dL (ref 8.6–10.4)
Chloride: 104 mmol/L (ref 98–110)
Creat: 0.64 mg/dL (ref 0.60–0.93)
Globulin: 2.6 g/dL (calc) (ref 1.9–3.7)
Glucose, Bld: 115 mg/dL — ABNORMAL HIGH (ref 65–99)
Potassium: 3.8 mmol/L (ref 3.5–5.3)
Sodium: 141 mmol/L (ref 135–146)
Total Bilirubin: 0.5 mg/dL (ref 0.2–1.2)
Total Protein: 6.8 g/dL (ref 6.1–8.1)

## 2019-12-25 LAB — T3, FREE: T3, Free: 4.1 pg/mL (ref 2.3–4.2)

## 2019-12-25 LAB — CBC WITH DIFFERENTIAL/PLATELET
Absolute Monocytes: 577 cells/uL (ref 200–950)
Basophils Absolute: 12 cells/uL (ref 0–200)
Basophils Relative: 0.2 %
Eosinophils Absolute: 0 cells/uL — ABNORMAL LOW (ref 15–500)
Eosinophils Relative: 0 %
HCT: 42.4 % (ref 35.0–45.0)
Hemoglobin: 14.3 g/dL (ref 11.7–15.5)
Lymphs Abs: 1581 cells/uL (ref 850–3900)
MCH: 30.8 pg (ref 27.0–33.0)
MCHC: 33.7 g/dL (ref 32.0–36.0)
MCV: 91.4 fL (ref 80.0–100.0)
MPV: 12 fL (ref 7.5–12.5)
Monocytes Relative: 9.3 %
Neutro Abs: 4030 cells/uL (ref 1500–7800)
Neutrophils Relative %: 65 %
Platelets: 211 10*3/uL (ref 140–400)
RBC: 4.64 10*6/uL (ref 3.80–5.10)
RDW: 13.4 % (ref 11.0–15.0)
Total Lymphocyte: 25.5 %
WBC: 6.2 10*3/uL (ref 3.8–10.8)

## 2019-12-25 LAB — HEMOGLOBIN A1C
Hgb A1c MFr Bld: 5.9 % of total Hgb — ABNORMAL HIGH (ref <5.7)
Mean Plasma Glucose: 123 (calc)
eAG (mmol/L): 6.8 (calc)

## 2019-12-25 LAB — TSH: TSH: 0.74 mIU/L (ref 0.40–4.50)

## 2019-12-25 LAB — T4, FREE: Free T4: 1.6 ng/dL (ref 0.8–1.8)

## 2019-12-27 ENCOUNTER — Telehealth: Payer: Self-pay

## 2019-12-27 NOTE — Telephone Encounter (Signed)
Spoke with pt and gave lab results. Pt understood w/o any questions.

## 2020-01-21 ENCOUNTER — Other Ambulatory Visit: Payer: Self-pay | Admitting: Family Medicine

## 2020-01-21 NOTE — Telephone Encounter (Signed)
Please advise 

## 2020-02-01 DIAGNOSIS — E119 Type 2 diabetes mellitus without complications: Secondary | ICD-10-CM | POA: Diagnosis not present

## 2020-02-01 DIAGNOSIS — E669 Obesity, unspecified: Secondary | ICD-10-CM | POA: Diagnosis not present

## 2020-02-01 DIAGNOSIS — G4700 Insomnia, unspecified: Secondary | ICD-10-CM | POA: Diagnosis not present

## 2020-02-01 DIAGNOSIS — G8929 Other chronic pain: Secondary | ICD-10-CM | POA: Diagnosis not present

## 2020-02-01 DIAGNOSIS — M1990 Unspecified osteoarthritis, unspecified site: Secondary | ICD-10-CM | POA: Diagnosis not present

## 2020-02-01 DIAGNOSIS — E039 Hypothyroidism, unspecified: Secondary | ICD-10-CM | POA: Diagnosis not present

## 2020-02-01 DIAGNOSIS — J449 Chronic obstructive pulmonary disease, unspecified: Secondary | ICD-10-CM | POA: Diagnosis not present

## 2020-02-01 DIAGNOSIS — E785 Hyperlipidemia, unspecified: Secondary | ICD-10-CM | POA: Diagnosis not present

## 2020-02-01 DIAGNOSIS — I10 Essential (primary) hypertension: Secondary | ICD-10-CM | POA: Diagnosis not present

## 2020-02-01 DIAGNOSIS — M545 Low back pain, unspecified: Secondary | ICD-10-CM | POA: Diagnosis not present

## 2020-02-04 ENCOUNTER — Other Ambulatory Visit: Payer: Self-pay | Admitting: *Deleted

## 2020-02-04 DIAGNOSIS — Q278 Other specified congenital malformations of peripheral vascular system: Secondary | ICD-10-CM

## 2020-02-11 ENCOUNTER — Other Ambulatory Visit: Payer: Self-pay | Admitting: Family Medicine

## 2020-02-15 ENCOUNTER — Other Ambulatory Visit: Payer: Self-pay

## 2020-02-15 ENCOUNTER — Ambulatory Visit: Payer: Medicare HMO | Admitting: Physician Assistant

## 2020-02-15 ENCOUNTER — Ambulatory Visit (HOSPITAL_COMMUNITY)
Admission: RE | Admit: 2020-02-15 | Discharge: 2020-02-15 | Disposition: A | Discharge status: Home / Self Care | Payer: Medicare HMO | Source: Ambulatory Visit | Attending: Physician Assistant | Admitting: Physician Assistant

## 2020-02-15 VITALS — BP 159/80 | HR 85 | Temp 98.3°F | Resp 20 | Ht 68.0 in | Wt 191.6 lb

## 2020-02-15 DIAGNOSIS — R229 Localized swelling, mass and lump, unspecified: Secondary | ICD-10-CM | POA: Diagnosis not present

## 2020-02-15 DIAGNOSIS — I8312 Varicose veins of left lower extremity with inflammation: Secondary | ICD-10-CM | POA: Diagnosis not present

## 2020-02-15 DIAGNOSIS — Q278 Other specified congenital malformations of peripheral vascular system: Secondary | ICD-10-CM | POA: Diagnosis not present

## 2020-02-15 NOTE — Progress Notes (Signed)
[facility]           OF [place]  History and Physical   [name] [name] is a 69 y.o. female who presents with left leg swelling.  She had venous duplex in May that revealed no DVT.  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  She had skin thinkening and SQ soft tissue swelling/edema/fluid mainly involved in the lower aspect of the left leg suggesting cellulitis or possible venous congestion or CHF.  At that time, she was treated for cellulitis and given lasix.  When she stopped the lasix, the nodules came back up with swelling.    Pt is referred to [facility] for further evaluation. She explains that these nodules and swelling have been present for 3 months and the nodules have decreased in size some what. She was only made aware of her popliteal fullness after her PCP noticed it on exam. She denies any pain in this area. This area has not changed since it was initially found. She does have some pins and needles and tenderness in her left foot intermittently. She also describes tiredness in her legs, left greater than right. She has some swelling but overall this has improved. She does elevate her legs at night. No prior use of compression stockings. She denies any claudication, rest pain or non healing wounds. She has no Hx of DVT. No family hx of DVT or venous disease.  She does have history of Skin Cancer, Breast Cancer and Cervical Cancer  The patient has no history of DVT. Pt does not have a history of varicose vein.   Pt does have history of skin changes in lower legs.   There is no family history of venous disorders.   The patient has  Never used compression stockings in the past.    The pt is on a statin for cholesterol management.  The pt is not on a daily aspirin.   Other AC:  The pt is on Diuretic  for hypertension.   The pt is not diabetic.   Tobacco hx: current smoker, 1.5 ppd  Past Medical History:  Diagnosis Date  . Anxiety   . Arthritis   . 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 Cancer ([place])    cervix  . Chronic sinusitis    [facility]  . COPD (chronic obstructive pulmonary disease) ([place])    PFT 2011  . Depression   . Hypothyroidism   . Keratoconjunctivitis    Right eye  . Thyroid disease    Grave disease    Past Surgical History:  Procedure Laterality Date  . ABDOMINAL HYSTERECTOMY     cervical cancer  . BREAST LUMPECTOMY Right 11/25/2006   malignant  . BREAST SURGERY    . COLONOSCOPY N/A 12/15/2016   Procedure: COLONOSCOPY;  Surgeon: Rogene Houston, MD;  Location[facility];  Service: Endoscopy;  Laterality: N/A;  930  . ECTOPIC PREGNANCY SURGERY    . POLYPECTOMY  12/15/2016   Procedure: POLYPECTOMY;  Surgeon: Rogene Houston, MD;  Location: [facility];  Service: Endoscopy;;  ascending, cecal, recto-sigmoid  . TOTAL HIP ARTHROPLASTY Left     Social History   Socioeconomic History  . Marital status: Married    Spouse name:  Not on file  . Number of children: 3  . Years of education: Not on file  . Highest education level: Not on file  Occupational History  . Not on file  Tobacco Use  . Smoking status: Current Every Day Smoker    Packs/day: 1.50    Years: 40.00    Pack years: 60.00  . Smokeless tobacco: Never Used  . Tobacco comment: quit 11/2013  Vaping Use  . Vaping Use: Some days  Substance and Sexual Activity  . Alcohol use: Yes    Alcohol/week: 0.0 standard drinks    Comment: 1/month  . Drug use: No  . Sexual activity: Not Currently    Birth control/protection: Surgical  Other Topics Concern  . Not on file  Social History Narrative  . Not on file   Social Determinants of Health   Financial Resource Strain:   . Difficulty of Paying Living Expenses: Not on file  Food Insecurity:   . Worried About Sales executive in the Last Year: Not on file  . Ran Out of Food in the Last Year: Not on file  Transportation Needs:   . Lack of Transportation (Medical): Not on file  . Lack of Transportation (Non-Medical): Not on file  Physical Activity:   . Days of Exercise per Week: Not on file  . Minutes of Exercise per Session: Not on file  Stress:   . Feeling of Stress : Not on file  Social Connections:   . Frequency of Communication with Friends and Family: Not on file  . Frequency of Social Gatherings with Friends and Family: Not on file  . Attends Religious Services: Not on file  . Active Member of Clubs or Organizations: Not on file  . Attends Archivist Meetings: Not on file  . Marital Status: Not on file  Intimate Partner Violence:   . Fear of Current or Ex-Partner: Not on file  . Emotionally Abused: Not on file  . Physically Abused: Not on file  . Sexually Abused: Not on file    Family History  Problem Relation Age of Onset  . Cancer Mother        lung  . Cancer Sister        ?lymphoma  . Diabetes Maternal Grandmother   . Dementia Father   . Colon cancer Neg Hx     Current Outpatient Medications  Medication Sig Dispense Refill  . albuterol (PROVENTIL HFA;VENTOLIN HFA) 108 (90 Base) MCG/ACT inhaler Inhale 2 puffs into the lungs every 6 (six) hours as needed for wheezing or shortness of breath. 1 Inhaler 1  . atorvastatin (LIPITOR) 40 MG tablet Take 1 tablet by mouth daily 90 tablet 0  . furosemide (LASIX) 20 MG tablet Take 1 tablet (20 mg total) by mouth daily as needed. 90 tablet 1  . levothyroxine (EUTHYROX) 75 MCG tablet Take 1 tablet (75 mcg total) by mouth daily before breakfast. 90 tablet 1  . traMADol (ULTRAM) 50 MG tablet TAKE 1 TABLET BY MOUTH EVERY 8 HOURS AS NEEDED 60 tablet 1  . zolpidem (AMBIEN) 10 MG tablet Take 1 tablet (10 mg total) by mouth at bedtime as needed. for sleep 30 tablet 2   No current facility-administered medications for this visit.     Allergies  Allergen Reactions  . Ibuprofen Hives  . Naproxen Sodium Hives  . Aspirin Hives  . Latex Rash  . Vicodin [Hydrocodone-Acetaminophen] Hives  . Hydrocodeine [Dihydrocodeine] Rash    REVIEW OF SYSTEMS:  [  X] denotes positive finding, [ ]  denotes negative finding Cardiac  Comments:  Chest pain or chest pressure:    Shortness of breath upon exertion:    Short of breath when lying flat:    Irregular heart rhythm:        Vascular    Pain in calf, thigh, or hip brought on by ambulation:    Pain in feet at night that wakes you up from your sleep:     Blood clot in your veins:    Leg swelling:         Pulmonary    Oxygen at home:    Productive cough:     Wheezing:         Neurologic    Sudden weakness in arms or legs:     Sudden numbness in arms or legs:     Sudden onset of difficulty speaking or slurred speech:    Temporary loss of vision in one eye:     Problems with dizziness:         Gastrointestinal    Blood in stool:     Vomited blood:         Genitourinary    Burning when urinating:     Blood in urine:        Psychiatric    Major depression:         Hematologic    Bleeding problems:    Problems with blood clotting too easily:        Skin    Rashes or ulcers:        Constitutional    Fever or chills:      PHYSICAL EXAMINATION:  Vitals:   02/15/20 1339  BP: (!) 159/80  Pulse: 85  Resp: 20  Temp: 98.3 F (36.8 C)  SpO2: 94%   General:  WDWN in NAD; vital signs documented above Gait: Normal  HENT: WNL, normocephalic Pulmonary: normal non-labored breathing without wheezing Cardiac: regular HR; without carotid bruit Abdomen: soft, NT, no masses; aortic pulse is not palpable Vascular Exam/Pulses:  Right Left  Radial 2+ (normal) 2+ (normal)  DP 2+ (normal) 2+ (normal)  PT 2+ (normal) 2+ (normal)   Extremities: without ischemic changes, without cellulitis; without open wounds; with mild skin color changes of distal left anterior  leg- mild erythema and flesh colored nodules. Nodules are fixed, non tender, non pulsatile, firm masses. Fullness in left popliteal fossa, non pulsatile, non tender, no overlying skin changes    Musculoskeletal: no muscle wasting or atrophy  Neurologic: A&O X 3;  moving all extremities equally Psychiatric:  The pt has Normal affect.   Non-Invasive Vascular Imaging:   Venous duplex on 02/15/2020: Left:  - No evidence of deep vein thrombosis seen in the left lower extremity, from the common femoral through the popliteal veins.  - No evidence of superficial venous thrombosis in the left lower extremity.  - There is no evidence of venous reflux seen in the left lower extremity.  - Hypoechoic dense area in the popliteal fossa measuring 1.39 x 1.78 cm with vascularity. Does not appear to be venous in nature. Etiology unknown.  - Palpable areas at lateral ankle are not vascular/ venous structures  CT scan May 2021 at [facility]: IMPRESSION: 1. Skin thickening and subcutaneous soft tissue swelling/edema/fluid mainly involving the lower aspect of the left leg suggesting cellulitis or possible venous congestion or CHF. 2. No findings suspicious for myofasciitis or pyomyositis. 3. No soft tissue abscess is identified. 4.  No findings for septic arthritis or osteomyelitis. 5. 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 female who presents with left lower extremity swelling and lower extremity nodules. She had CT done by PCP showing possible varix in popliteal fossa as well as soft tissue fluid collections in left lower extremity. Her duplex today shows normal venous flow in left lower extremity, no insufficiency, no DVT or SVT. It does mention a hypoechoic mass in the popliteal fossa with vascularity as well as some structures in the distal leg not felt to be vascular in nature. Clinically patient does not have any  concerning signs or symptoms concerning for malignancy or vascular anomaly. She is overly concerned about it being a cancer with her history. Clinically she has adequate arterial flow with easily palpable pulses in both her legs. Her popliteal fossa mass as well as anterior tibial nodules may or may not be related as I am not entirely sure of their etiology. It is possible that her Nodules are related to her Graves disease and are the start of pretibial mix edema. Discussed with her biopsy vs surgical excision for definitive diagnosis, but I do not recommend this at this time. Patient agrees that she does not want to have any invasive procedures unless necessary. Dr. Donzetta Matters and I provided reassurance that we do not feel that this is anything malignant and that she has both adequate venous and arterial flow in her left leg and no DVT was found on imaging.  - patient was seen and CT imaging reviewed with Dr. Donzetta Matters who agrees with following plan -discussed with pt about wearing her knee high compression stockings and elevating legs as needed  - She will follow up earlier if she has any new or concerning changes in her left lower extremity -pt will f/u in 6 months with repeat duplex to evaluate for any changes in popliteal fossa varix   [facility] 02/15/2020 1:55 PM  Clinic MD:  Donzetta Matters

## 2020-02-19 ENCOUNTER — Other Ambulatory Visit: Payer: Self-pay

## 2020-02-19 DIAGNOSIS — I8312 Varicose veins of left lower extremity with inflammation: Secondary | ICD-10-CM

## 2020-03-19 ENCOUNTER — Other Ambulatory Visit: Payer: Self-pay | Admitting: Family Medicine

## 2020-03-19 DIAGNOSIS — Z0000 Encounter for general adult medical examination without abnormal findings: Secondary | ICD-10-CM

## 2020-03-19 NOTE — Telephone Encounter (Signed)
Ok to refill??  Last office visit 12/24/2019.  Last refill 01/21/2020.

## 2020-04-14 ENCOUNTER — Other Ambulatory Visit: Payer: Self-pay | Admitting: Family Medicine

## 2020-04-14 NOTE — Telephone Encounter (Signed)
Ok to refill??  Last office visit/ refill 12/24/2019, #2 refills.

## 2020-04-25 ENCOUNTER — Other Ambulatory Visit: Payer: Self-pay | Admitting: Family Medicine

## 2020-04-25 NOTE — Telephone Encounter (Signed)
Ok to refill??  Last office visit 12/24/2019.  Last refill 03/19/2020.

## 2020-04-28 ENCOUNTER — Ambulatory Visit
Admission: RE | Admit: 2020-04-28 | Discharge: 2020-04-28 | Disposition: A | Discharge status: Home / Self Care | Payer: Medicare HMO | Source: Ambulatory Visit | Attending: Family Medicine | Admitting: Family Medicine

## 2020-04-28 ENCOUNTER — Other Ambulatory Visit: Payer: Self-pay

## 2020-04-28 DIAGNOSIS — Z0000 Encounter for general adult medical examination without abnormal findings: Secondary | ICD-10-CM

## 2020-04-28 DIAGNOSIS — Z1231 Encounter for screening mammogram for malignant neoplasm of breast: Secondary | ICD-10-CM | POA: Diagnosis not present

## 2020-04-30 ENCOUNTER — Telehealth: Payer: Self-pay | Admitting: *Deleted

## 2020-04-30 MED ORDER — ZOLPIDEM TARTRATE 10 MG PO TABS: 10.0000 mg | ORAL_TABLET | Freq: Every evening | ORAL | 1 refills | Status: DC | PRN

## 2020-04-30 MED ORDER — TRAMADOL HCL 50 MG PO TABS: 50.0000 mg | ORAL_TABLET | Freq: Three times a day (TID) | ORAL | 1 refills | Status: DC | PRN

## 2020-04-30 NOTE — Telephone Encounter (Signed)
Call placed to patient and patient made aware.  

## 2020-04-30 NOTE — Telephone Encounter (Signed)
I have sent over refill on ultram and ambien She can see Janett Billow

## 2020-04-30 NOTE — Telephone Encounter (Signed)
Received call from patient.   Reports that letter from was received from PCP in regards to her leaving office. Inquired as to how they can establish within office. Advised that Dr Dennard Schaumann is unable to accept new patients at this time.   PCP advised to call one of the providers on the list with the letter to establish.   Patient inquired as to medication refills until she is established with another provider. Reports that she is concerned about Ambien and Tramadol.   Please advise.

## 2020-05-21 ENCOUNTER — Other Ambulatory Visit: Payer: Self-pay | Admitting: Family Medicine

## 2020-05-21 NOTE — Telephone Encounter (Signed)
Ok to refill??  Last office visit 12/24/2019  Last refill 04/30/2020.

## 2020-05-27 ENCOUNTER — Other Ambulatory Visit: Payer: Self-pay | Admitting: Family Medicine

## 2020-07-21 ENCOUNTER — Other Ambulatory Visit: Payer: Self-pay | Admitting: Family Medicine

## 2020-07-22 ENCOUNTER — Other Ambulatory Visit: Payer: Self-pay | Admitting: Nurse Practitioner

## 2020-07-22 ENCOUNTER — Other Ambulatory Visit: Payer: Self-pay | Admitting: Family Medicine

## 2020-07-23 NOTE — Telephone Encounter (Signed)
PDMP reviewed and appropriate.  Courtesy refill - will need an appointment prior to any further refills

## 2020-08-09 ENCOUNTER — Other Ambulatory Visit: Payer: Self-pay | Admitting: Family Medicine

## 2020-08-15 ENCOUNTER — Other Ambulatory Visit: Payer: Self-pay | Admitting: Family Medicine

## 2020-08-15 NOTE — Telephone Encounter (Signed)
PDMP reviewed and appropriate.  Will give courtesy refill.  Needs f/u appt with me for further refills.

## 2020-08-15 NOTE — Telephone Encounter (Signed)
Ok to refill??  Last office visit 07/22/2020.  Last refill 04/30/2020, #1 refill.

## 2020-08-15 NOTE — Telephone Encounter (Signed)
Called pt to schedule an appt, lvm, will try to contact pt again

## 2020-08-19 NOTE — Telephone Encounter (Signed)
Follow up scheduled for 6/27.

## 2020-08-25 ENCOUNTER — Other Ambulatory Visit: Payer: Self-pay

## 2020-08-27 MED ORDER — TRAMADOL HCL 50 MG PO TABS: 50.0000 mg | ORAL_TABLET | Freq: Two times a day (BID) | ORAL | 0 refills | Status: DC | PRN

## 2020-08-27 NOTE — Telephone Encounter (Signed)
PDMP reviewed.  F/u has been scheduled with me for later this month - will plan to discuss medication then.

## 2020-09-08 ENCOUNTER — Encounter: Payer: Self-pay | Admitting: Nurse Practitioner

## 2020-09-08 ENCOUNTER — Other Ambulatory Visit: Payer: Self-pay

## 2020-09-08 ENCOUNTER — Ambulatory Visit (INDEPENDENT_AMBULATORY_CARE_PROVIDER_SITE_OTHER): Payer: Medicare HMO | Admitting: Nurse Practitioner

## 2020-09-08 VITALS — BP 158/82 | HR 82 | Ht 68.0 in | Wt 183.0 lb

## 2020-09-08 DIAGNOSIS — M1612 Unilateral primary osteoarthritis, left hip: Secondary | ICD-10-CM

## 2020-09-08 DIAGNOSIS — F5101 Primary insomnia: Secondary | ICD-10-CM

## 2020-09-08 DIAGNOSIS — Z79899 Other long term (current) drug therapy: Secondary | ICD-10-CM

## 2020-09-08 DIAGNOSIS — K219 Gastro-esophageal reflux disease without esophagitis: Secondary | ICD-10-CM | POA: Diagnosis not present

## 2020-09-08 DIAGNOSIS — Z136 Encounter for screening for cardiovascular disorders: Secondary | ICD-10-CM | POA: Diagnosis not present

## 2020-09-08 DIAGNOSIS — F17200 Nicotine dependence, unspecified, uncomplicated: Secondary | ICD-10-CM

## 2020-09-08 DIAGNOSIS — I700 Atherosclerosis of aorta: Secondary | ICD-10-CM | POA: Diagnosis not present

## 2020-09-08 DIAGNOSIS — F411 Generalized anxiety disorder: Secondary | ICD-10-CM | POA: Diagnosis not present

## 2020-09-08 DIAGNOSIS — J431 Panlobular emphysema: Secondary | ICD-10-CM

## 2020-09-08 DIAGNOSIS — E782 Mixed hyperlipidemia: Secondary | ICD-10-CM

## 2020-09-08 DIAGNOSIS — Z131 Encounter for screening for diabetes mellitus: Secondary | ICD-10-CM

## 2020-09-08 DIAGNOSIS — E0500 Thyrotoxicosis with diffuse goiter without thyrotoxic crisis or storm: Secondary | ICD-10-CM

## 2020-09-08 DIAGNOSIS — E039 Hypothyroidism, unspecified: Secondary | ICD-10-CM

## 2020-09-08 DIAGNOSIS — Z13220 Encounter for screening for lipoid disorders: Secondary | ICD-10-CM | POA: Diagnosis not present

## 2020-09-08 DIAGNOSIS — R7309 Other abnormal glucose: Secondary | ICD-10-CM | POA: Diagnosis not present

## 2020-09-08 DIAGNOSIS — R69 Illness, unspecified: Secondary | ICD-10-CM | POA: Diagnosis not present

## 2020-09-08 MED ORDER — ALBUTEROL SULFATE HFA 108 (90 BASE) MCG/ACT IN AERS: 2.0000 | INHALATION_SPRAY | Freq: Four times a day (QID) | RESPIRATORY_TRACT | 1 refills | Status: DC | PRN

## 2020-09-08 NOTE — Progress Notes (Signed)
Subjective:    Patient ID: [name] [name], female    DOB: [birth year], 70 y.o.   MRN: [id number]  HPI: [name] [name] is a 70 y.o. female presenting for follow up and medication refill.  Chief Complaint  Patient presents with   Medication Refill    Inhaler, tramadol and ambien, wants to talk about grave disease as well   Has eye doctor - sees every 6 months to yearly.  Wondering if she needs to see eye doctor specifically for Grave's Disease.  INSOMNIA Ambien helps her sleep 3-4 hours consistently without waking up.  Has been taking Ambien 10 mg daily for many years.  Duration: chronic Satisfied with sleep quality: yes Difficulty falling asleep: no Difficulty staying asleep: yes Waking a few hours after sleep onset: yes Early morning awakenings: no Daytime hypersomnolence: no Wakes feeling refreshed: yes Good sleep hygiene: no Apnea: unknown Snoring: yes Depressed/anxious mood: no Recent stress: no Chronic pain/arthritis: yes History of sleep study: no Treatments attempted: Ambien   CHRONIC PAIN  Reports she has Grave's Disease and takes tramadol for this and arthritis.  She is currently taking 50 mg in the morning and at night time.  Pain control status: controlled Duration: chronic Location: hip, shoulders Quality: constant Current Pain Level: moderate Previous Pain Level: severe Breakthrough pain: yes Benefit from narcotic medications: yes What Activities task can be accomplished with current medication?: ADLs, caring for self Interested in weaning off narcotics:no   Stool softners/OTC fiber: yes  Previous pain specialty evaluation: no Non-narcotic analgesic meds: yes  COPD Had to take inhaler at beginning of June and reports her breathing was "real bad."  Is still trying to quit smoking - tried Wellbutrin in the past without success.  Out of albuterol.  COPD status: controlled Satisfied with current treatment?: no Oxygen use: no Dyspnea frequency:  rarely Cough frequency: rarely Rescue inhaler frequency:  > 1 year Limitation of activity: no Productive cough: yes Last Spirometry: never - years ago Pneumovax: Up to Date Influenza: Up to Date  Takes Lasix 20 mg about 2 times per week for swelling in lower extremities.  HYPOTHYROIDISM Currently taking Euthyrox 75 mcg daily.   Thyroid control status:controlled Satisfied with current treatment? yes Medication side effects: no Medication compliance: excellent compliance Recent dose adjustment:no Fatigue: yes Cold intolerance: no Heat intolerance: no Weight gain: no Weight loss: yes Constipation: no Diarrhea/loose stools: no Palpitations: no Lower extremity edema: no Anxiety/depressed mood: no  Allergies  Allergen Reactions   Ibuprofen Hives   Naproxen Sodium Hives   Aspirin Hives   Latex Rash   Vicodin [Hydrocodone-Acetaminophen] Hives   Hydrocodeine [Dihydrocodeine] Rash    Outpatient Encounter Medications as of 09/08/2020  Medication Sig   atorvastatin (LIPITOR) 40 MG tablet Take 1 tablet by mouth once daily   EUTHYROX 75 MCG tablet Take 1 tablet by mouth once daily   furosemide (LASIX) 20 MG tablet TAKE 1 TABLET BY MOUTH ONCE DAILY AS NEEDED   traMADol (ULTRAM) 50 MG tablet Take 1 tablet (50 mg total) by mouth 2 (two) times daily as needed for severe pain.   [DISCONTINUED] albuterol (PROVENTIL HFA;VENTOLIN HFA) 108 (90 Base) MCG/ACT inhaler Inhale 2 puffs into the lungs every 6 (six) hours as needed for wheezing or shortness of breath.   [DISCONTINUED] zolpidem (AMBIEN) 10 MG tablet TAKE 1 TABLET BY MOUTH AT BEDTIME AS NEEDED FOR SLEEP   albuterol (VENTOLIN HFA) 108 (90 Base) MCG/ACT inhaler Inhale 2 puffs into the lungs every 6 (  six) hours as needed for wheezing or shortness of breath.   [START ON 09/14/2020] zolpidem (AMBIEN) 10 MG tablet Take 1 tablet (10 mg total) by mouth at bedtime as needed for sleep. for sleep   No facility-administered encounter medications  on file as of 09/08/2020.    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   Aortic atherosclerosis ([place]) 03/02/2019   Hypothyroidism 08/17/2018   Osteopenia 09/13/2016   Special screening for malignant neoplasms, colon 08/11/2016   Borderline diabetes 08/02/2016   Insomnia 09/09/2014   Urinary incontinence 09/09/2014   COPD (chronic obstructive pulmonary disease) ([place]) 04/24/2014   Chronic diarrhea 01/16/2014   GERD (gastroesophageal reflux disease) 01/16/2014   GAD (generalized anxiety disorder) 01/16/2014   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/2014   Graves' eye disease 11/22/2013   DCIS (ductal carcinoma in situ) 11/09/2011   TOBACCO ABUSE 02/25/2010   MDD (major depressive disorder) 02/25/2010   Hyperlipidemia 05/20/2009   Anxiety state 05/20/2009   OA (osteoarthritis) of hip 05/20/2009    Past Medical History:  Diagnosis Date   Anxiety    Arthritis    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 Interim medical history since our last visit reviewed.  Review of Systems Per HPI unless specifically indicated above     Objective:    BP (!) 158/82   Pulse 82   Ht 5\' 8"  (1.727 m)   Wt 183 lb (83 kg)   SpO2 96%   BMI 27.83 kg/m   Wt Readings from Last 3 Encounters:  09/08/20 183 lb (83 kg)  02/15/20 191 lb 9.6 oz (86.9 kg)  12/24/19 201 lb (91.2 kg)    Physical Exam Vitals and nursing note reviewed.  Constitutional:      General: She is not in acute distress.    Appearance: Normal appearance. She is obese. She is not toxic-appearing.   HENT:     Head: Normocephalic and atraumatic.  Eyes:     General: No scleral icterus. Neck:     Vascular: No carotid bruit.  Cardiovascular:     Rate and Rhythm: Normal rate and regular rhythm.     Heart sounds: Normal heart sounds. No murmur heard. Pulmonary:     Effort: Pulmonary effort is normal. No respiratory distress.     Breath sounds: Normal breath sounds. No wheezing, rhonchi or rales.  Abdominal:     General: Abdomen is flat. Bowel sounds are normal.     Palpations: Abdomen is soft.     Tenderness: no abdominal tenderness  Musculoskeletal:        General: No swelling. Normal range of motion.     Cervical back: Normal range of motion.     Right lower leg: No edema.     Left lower leg: No edema.  Skin:    General: Skin is warm and dry.     Capillary Refill: Capillary refill takes less than 2 seconds.     Coloration: Skin is not jaundiced.  Findings: No bruising.  Neurological:     General: No focal deficit present.     Mental Status: She is alert and oriented to person, place, and time.     Motor: No weakness.     Gait: Gait normal.  Psychiatric:        Mood and Affect: Mood normal.        Behavior: Behavior normal.        Thought Content: Thought content normal.        Judgment: Judgment normal.      Assessment & Plan:   Problem List Items Addressed This Visit       Cardiovascular and Mediastinum   Aortic atherosclerosis ([place])    Chronic.  Maintains on atorvastatin 40 mg daily well.  Lipids and CMP checked today.  Continue atorvastatin.  Follow up in 6 months.         Respiratory   COPD (chronic obstructive pulmonary disease) ([facility])    Chronic.  Well controlled with rescue inhaler as needed.  Discussed that if using rescue inhaler more than a few times weekly, may need maintenance medication and will need to return to office.  Refill given of albuterol to have on hand.  Patient is interested in smoking cessation; discussed cutting back to quit method  today.  Previously did not tolerate Wellbutrin.  Follow-up in 3 months or sooner if needed.       Relevant Medications   albuterol (VENTOLIN HFA) 108 (90 Base) MCG/ACT inhaler   Other Relevant Orders   COMPLETE METABOLIC PANEL WITH GFR (Completed)   CBC with Differential/Platelet (Completed)     Endocrine   Hypothyroidism    Chronic.  We will check thyroid levels today and adjust medication as needed.  Continue Euthyrox 75 mcg daily for now.  Follow-up in 6 months.       Graves' eye disease    Follows with eye doctor-encourage patient to call back to clinic with eye doctors names we can see if this is an optometrist or ophthalmologist.  Is likely an ophthalmologist which is needed for Graves' disease.       Relevant Orders   TSH (Completed)     Musculoskeletal and Integument   OA (osteoarthritis) of hip    Chronic.  Patient is currently taking tramadol 50 mg in the morning and 50 mg in the evening for chronic pain.  I advised her that I am not comfortable prescribing chronic pain medication for daily use.  She is in agreement to see a pain management provider.  Referral placed today.  I will bridge refills until she can get in with pain provider.  Refill given today.       Relevant Orders   Ambulatory referral to Pain Clinic     Other   TOBACCO ABUSE    Chronic.  She is working on cutting back and is interested in cessation- I advised complete cessation today.  Discussed low-dose CT scan to screen for lung cancer-patient will think about this for now.       Insomnia    Chronic.  Patient reports taking Ambien 10 mg daily for many years.  It helps her get more than 2 hours of sleep per night.  Not interested in counseling at this time. Pt is aware of risks of psychoactive medication use to include increased sedation, respiratory suppression, falls, extrapyramidal movements,  dependence and cardiovascular events.  Pt would like to continue treatment as benefit determined to  outweigh risk.  Refill  given and controlled substance contract was signed today.  Follow-up in 3 months.        Relevant Medications   zolpidem (AMBIEN) 10 MG tablet (Start on 09/14/2020)   Hyperlipidemia - Primary    Chronic.  Maintained on atorvastatin 40 mg daily.  We will recheck lipids today and CMP.  Follow-up in 6 months.       Relevant Orders   Lipid panel (Completed)   COMPLETE METABOLIC PANEL WITH GFR (Completed)   CBC with Differential/Platelet (Completed)   GAD (generalized anxiety disorder)   Controlled substance agreement signed    For Ambien-I agreed to prescribe this with close monitoring for oversedation.       Other Visit Diagnoses     Screening for diabetes mellitus       Relevant Orders   Hemoglobin A1c (Completed)        Follow up plan: Return in about 3 months (around 12/09/2020) for follow up.

## 2020-09-09 ENCOUNTER — Telehealth: Payer: Self-pay | Admitting: Nurse Practitioner

## 2020-09-09 ENCOUNTER — Other Ambulatory Visit: Payer: Self-pay

## 2020-09-09 LAB — COMPLETE METABOLIC PANEL WITH GFR
AG Ratio: 1.2 (calc) (ref 1.0–2.5)
ALT: 21 U/L (ref 6–29)
AST: 21 U/L (ref 10–35)
Albumin: 3.7 g/dL (ref 3.6–5.1)
Alkaline phosphatase (APISO): 87 U/L (ref 37–153)
BUN/Creatinine Ratio: 18 (calc) (ref 6–22)
BUN: 10 mg/dL (ref 7–25)
CO2: 30 mmol/L (ref 20–32)
Calcium: 8.7 mg/dL (ref 8.6–10.4)
Chloride: 102 mmol/L (ref 98–110)
Creat: 0.57 mg/dL — ABNORMAL LOW (ref 0.60–0.93)
GFR, Est African American: 109 mL/min/{1.73_m2} (ref >=60)
GFR, Est Non African American: 94 mL/min/{1.73_m2} (ref >=60)
Globulin: 3 g/dL (calc) (ref 1.9–3.7)
Glucose, Bld: 108 mg/dL — ABNORMAL HIGH (ref 65–99)
Potassium: 4.4 mmol/L (ref 3.5–5.3)
Sodium: 139 mmol/L (ref 135–146)
Total Bilirubin: 0.3 mg/dL (ref 0.2–1.2)
Total Protein: 6.7 g/dL (ref 6.1–8.1)

## 2020-09-09 LAB — CBC WITH DIFFERENTIAL/PLATELET
Absolute Monocytes: 593 cells/uL (ref 200–950)
Basophils Absolute: 8 cells/uL (ref 0–200)
Basophils Relative: 0.1 %
Eosinophils Absolute: 0 cells/uL — ABNORMAL LOW (ref 15–500)
Eosinophils Relative: 0 %
HCT: 40.2 % (ref 35.0–45.0)
Hemoglobin: 13.3 g/dL (ref 11.7–15.5)
Lymphs Abs: 1573 cells/uL (ref 850–3900)
MCH: 29.7 pg (ref 27.0–33.0)
MCHC: 33.1 g/dL (ref 32.0–36.0)
MCV: 89.7 fL (ref 80.0–100.0)
MPV: 10.9 fL (ref 7.5–12.5)
Monocytes Relative: 7.8 %
Neutro Abs: 5426 cells/uL (ref 1500–7800)
Neutrophils Relative %: 71.4 %
Platelets: 382 10*3/uL (ref 140–400)
RBC: 4.48 10*6/uL (ref 3.80–5.10)
RDW: 13.1 % (ref 11.0–15.0)
Total Lymphocyte: 20.7 %
WBC: 7.6 10*3/uL (ref 3.8–10.8)

## 2020-09-09 LAB — LIPID PANEL
Cholesterol: 162 mg/dL (ref <200)
HDL: 34 mg/dL — ABNORMAL LOW (ref >=50)
LDL Cholesterol (Calc): 96 mg/dL (calc)
Non-HDL Cholesterol (Calc): 128 mg/dL (calc) (ref <130)
Total CHOL/HDL Ratio: 4.8 (calc) (ref <5.0)
Triglycerides: 219 mg/dL — ABNORMAL HIGH (ref <150)

## 2020-09-09 LAB — TSH: TSH: 0.66 mIU/L (ref 0.40–4.50)

## 2020-09-09 LAB — HEMOGLOBIN A1C
Hgb A1c MFr Bld: 6.2 % of total Hgb — ABNORMAL HIGH (ref <5.7)
Mean Plasma Glucose: 131 mg/dL
eAG (mmol/L): 7.3 mmol/L

## 2020-09-09 NOTE — Telephone Encounter (Signed)
Patient called in requesting a refill on her [place]. She didn't understand why it wasn't called in yesterday when her other two medications were sent in.  She uses Paediatric nurse at [facility]  [phone]

## 2020-09-10 ENCOUNTER — Other Ambulatory Visit: Payer: Self-pay | Admitting: Nurse Practitioner

## 2020-09-10 MED ORDER — ZOLPIDEM TARTRATE 10 MG PO TABS
10.0000 mg | ORAL_TABLET | Freq: Every evening | ORAL | 2 refills | Status: DC | PRN
Start: 2020-09-14 — End: 2020-12-08

## 2020-09-10 NOTE — Assessment & Plan Note (Signed)
Follows with eye doctor-encourage patient to call back to clinic with eye doctors names we can see if this is an optometrist or ophthalmologist.  Is likely an ophthalmologist which is needed for Graves' disease.

## 2020-09-10 NOTE — Assessment & Plan Note (Signed)
Chronic.  Maintained on atorvastatin 40 mg daily.  We will recheck lipids today and CMP.  Follow-up in 6 months.

## 2020-09-10 NOTE — Assessment & Plan Note (Signed)
For Ambien-I agreed to prescribe this with close monitoring for oversedation.

## 2020-09-10 NOTE — Assessment & Plan Note (Signed)
Chronic.  We will check thyroid levels today and adjust medication as needed.  Continue Euthyrox 75 mcg daily for now.  Follow-up in 6 months.

## 2020-09-10 NOTE — Assessment & Plan Note (Addendum)
Chronic.  Maintains on atorvastatin 40 mg daily well.  Lipids and CMP checked today.  Continue atorvastatin.  Follow up in 6 months.

## 2020-09-10 NOTE — Assessment & Plan Note (Addendum)
Chronic.  She is working on cutting back and is interested in cessation- I advised complete cessation today.  Discussed low-dose CT scan to screen for lung cancer-patient will think about this for now.

## 2020-09-10 NOTE — Assessment & Plan Note (Signed)
Chronic.  Patient is currently taking tramadol 50 mg in the morning and 50 mg in the evening for chronic pain.  I advised her that I am not comfortable prescribing chronic pain medication for daily use.  She is in agreement to see a pain management provider.  Referral placed today.  I will bridge refills until she can get in with pain provider.  Refill given today.

## 2020-09-10 NOTE — Assessment & Plan Note (Addendum)
Chronic.  Well controlled with rescue inhaler as needed.  Discussed that if using rescue inhaler more than a few times weekly, may need maintenance medication and will need to return to office.  Refill given of albuterol to have on hand.  Patient is interested in smoking cessation; discussed cutting back to quit method today.  Previously did not tolerate Wellbutrin.  Follow-up in 3 months or sooner if needed.

## 2020-09-10 NOTE — Assessment & Plan Note (Signed)
Chronic.  Patient reports taking Ambien 10 mg daily for many years.  It helps her get more than 2 hours of sleep per night.  Not interested in counseling at this time. Pt is aware of risks of psychoactive medication use to include increased sedation, respiratory suppression, falls, extrapyramidal movements,  dependence and cardiovascular events.  Pt would like to continue treatment as benefit determined to outweigh risk.  Refill given and controlled substance contract was signed today.  Follow-up in 3 months.

## 2020-09-11 NOTE — Progress Notes (Signed)
Spoke with pt regarding results, understands that she is to better the food choices she's making

## 2020-09-25 ENCOUNTER — Telehealth: Payer: Self-pay | Admitting: Nurse Practitioner

## 2020-09-25 ENCOUNTER — Telehealth: Payer: Self-pay

## 2020-09-25 NOTE — Telephone Encounter (Signed)
Yes, refill on tramadol.

## 2020-09-26 ENCOUNTER — Other Ambulatory Visit: Payer: Self-pay

## 2020-09-26 MED ORDER — TRAMADOL HCL 50 MG PO TABS: 50.0000 mg | ORAL_TABLET | Freq: Two times a day (BID) | ORAL | 0 refills | Status: DC | PRN

## 2020-09-26 NOTE — Telephone Encounter (Signed)
PDMP reviewed.  Patient takes this medication chronically and is agreeable to see pain management - referral is pending.

## 2020-09-29 ENCOUNTER — Ambulatory Visit (INDEPENDENT_AMBULATORY_CARE_PROVIDER_SITE_OTHER): Payer: Medicare HMO

## 2020-09-29 ENCOUNTER — Encounter (HOSPITAL_COMMUNITY): Payer: Self-pay

## 2020-09-29 ENCOUNTER — Other Ambulatory Visit: Payer: Self-pay

## 2020-09-29 ENCOUNTER — Ambulatory Visit (HOSPITAL_COMMUNITY)
Admission: EM | Admit: 2020-09-29 | Discharge: 2020-09-29 | Disposition: A | Discharge status: Home / Self Care | Payer: Medicare HMO | Attending: Physician Assistant | Admitting: Physician Assistant

## 2020-09-29 DIAGNOSIS — M25552 Pain in left hip: Secondary | ICD-10-CM | POA: Diagnosis not present

## 2020-09-29 MED ORDER — PREDNISONE 10 MG (21) PO TBPK: ORAL_TABLET | Freq: Every day | ORAL | 0 refills | Status: DC

## 2020-09-29 NOTE — ED Provider Notes (Addendum)
[place]    CSN: [id number] Arrival date & time: 09/29/20  1435      History   Chief Complaint Chief Complaint  Patient presents with   Leg Pain   Hip Pain    HPI SEBASTIANA WUEST is a 70 y.o. female.   Pt complains of increased left hip pain that started about one week ago.  Denies injury or trauma.  History of left hip replacement about 7 years ago.  She reports some radiation of pain into the groin.  She denies numbness or tingling.  She is currently taking tramadol prescribed by her PCP.  She is also taking tylenol.  She has not seen ortho in several years.  She was recently referred to pain management. She reports pain is waking her up at night.     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   Aortic atherosclerosis ([place]) 03/02/2019   Hypothyroidism 08/17/2018   Osteopenia 09/13/2016   Special screening for malignant neoplasms, colon 08/11/2016   Borderline diabetes 08/02/2016   Insomnia 09/09/2014   Urinary incontinence 09/09/2014   COPD (chronic obstructive pulmonary disease) ([place]) 04/24/2014   Chronic diarrhea 01/16/2014   GERD (gastroesophageal reflux disease) 01/16/2014   GAD (generalized anxiety disorder) 01/16/2014   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/2014   Graves' eye disease 11/22/2013   DCIS (ductal carcinoma in situ) 11/09/2011   TOBACCO ABUSE 02/25/2010   MDD (major depressive disorder) 02/25/2010   Hyperlipidemia 05/20/2009   Anxiety state 05/20/2009   OA (osteoarthritis) of hip 05/20/2009     Past Surgical History:  Procedure Laterality Date   ABDOMINAL HYSTERECTOMY     cervical cancer   BREAST LUMPECTOMY Right 11/25/2006   malignant   BREAST SURGERY     COLONOSCOPY N/A 12/15/2016   Procedure: COLONOSCOPY;  Surgeon: Rogene Houston, MD;  Location: [facility];  Service: Endoscopy;  Laterality: N/A;  930   ECTOPIC PREGNANCY SURGERY     POLYPECTOMY  12/15/2016   Procedure: POLYPECTOMY;  Surgeon: Rogene Houston, MD;  Location: [facility];  Service: Endoscopy;;  ascending, cecal, recto-sigmoid   TOTAL HIP ARTHROPLASTY Left     OB History     Gravida  4   Para  3   Term  3   Preterm      AB  1   Living  3      SAB  1   IAB      Ectopic      Multiple      Live Births               Home Medications    Prior to Admission medications   Medication Sig Start Date End Date Taking? Authorizing Provider  albuterol (VENTOLIN HFA) 108 (90 Base) MCG/ACT inhaler Inhale 2 puffs into the lungs every 6 (six) hours as needed for wheezing or shortness of breath. 09/08/20  Eulogio Bear, NP  atorvastatin (LIPITOR) 40 MG tablet Take 1 tablet by mouth once daily 05/27/20   Eulogio Bear, NP  EUTHYROX 75 MCG tablet Take 1 tablet by mouth once daily 08/12/20   Eulogio Bear, NP  furosemide (LASIX) 20 MG tablet TAKE 1 TABLET BY MOUTH ONCE DAILY AS NEEDED 08/12/20   Eulogio Bear, NP  traMADol (ULTRAM) 50 MG tablet Take 1 tablet (50 mg total) by mouth 2 (two) times daily as needed for severe pain. 10/03/20   Eulogio Bear, NP  zolpidem (AMBIEN) 10 MG tablet Take 1 tablet (10 mg total) by mouth at bedtime as needed for sleep. for sleep 09/14/20   Eulogio Bear, NP    Family History Family History  Problem Relation Age of Onset   Cancer Mother        lung   Cancer Sister        ?lymphoma   Diabetes Maternal Grandmother    Dementia Father    Colon cancer Neg Hx     Social History Social History   Tobacco Use   Smoking  status: Every Day    Packs/day: 1.50    Years: 40.00    Pack years: 60.00    Types: Cigarettes   Smokeless tobacco: Never   Tobacco comments:    quit 11/2013  Vaping Use   Vaping Use: Some days  Substance Use Topics   Alcohol use: Yes    Alcohol/week: 0.0 standard drinks    Comment: 1/month   Drug use: No     Allergies   Ibuprofen, Naproxen sodium, Aspirin, Latex, Vicodin [hydrocodone-acetaminophen], and Hydrocodeine [dihydrocodeine]   Review of Systems Review of Systems  Constitutional:  Negative for chills and fever.  HENT:  Negative for ear pain and sore throat.   Eyes:  Negative for pain and visual disturbance.  Respiratory:  Negative for cough and shortness of breath.   Cardiovascular:  Negative for chest pain and palpitations.  Gastrointestinal:  Negative for abdominal pain and vomiting.  Genitourinary:  Negative for dysuria and hematuria.  Musculoskeletal:  Positive for arthralgias (left hip pain). Negative for back pain.  Skin:  Negative for color change and rash.  Neurological:  Negative for seizures and syncope.  All other systems reviewed and are negative.   Physical Exam Triage Vital Signs ED Triage Vitals  Enc Vitals Group     BP 09/29/20 1701 (!) 146/86     Pulse Rate 09/29/20 1701 82     Resp 09/29/20 1701 17     Temp 09/29/20 1701 98.3 F (36.8 C)     Temp Source 09/29/20 1701 Oral     SpO2 09/29/20 1701 95 %     Weight --      Height --      Head Circumference --      Peak Flow --      Pain Score 09/29/20 1713 9     Pain Loc --      Pain Edu? --      Excl. in [place]? --    No data found.  Updated Vital Signs BP (!) 146/86 (BP Location: Left Arm)   Pulse 82   Temp 98.3 F (36.8 C) (Oral)   Resp 17   SpO2 95%   Visual Acuity Right Eye Distance:   Left Eye Distance:   Bilateral Distance:    Right Eye Near:   Left Eye Near:    Bilateral Near:  Physical Exam Vitals and nursing note reviewed.  Constitutional:      General: She is  not in acute distress.    Appearance: She is well-developed.  HENT:     Head: Normocephalic and atraumatic.  Eyes:     Conjunctiva/sclera: Conjunctivae normal.  Cardiovascular:     Rate and Rhythm: Normal rate and regular rhythm.     Heart sounds: No murmur heard. Pulmonary:     Effort: Pulmonary effort is normal. No respiratory distress.     Breath sounds: Normal breath sounds.  Abdominal:     Palpations: Abdomen is soft.     Tenderness: There is no abdominal tenderness.  Musculoskeletal:     Cervical back: Neck supple.     Comments: TTP right greater troch. Minimal pain with internal and external rotation.  Negative SLR.  Skin:    General: Skin is warm and dry.  Neurological:     Mental Status: She is alert.     UC Treatments / Results  Labs (all labs ordered are listed, but only abnormal results are displayed) Labs Reviewed - No data to display  EKG   Radiology No results found.  Procedures Procedures (including critical care time)  Medications Ordered in UC Medications - No data to display  Initial Impression / Assessment and Plan / UC Course  I have reviewed the triage vital signs and the nursing notes.  Pertinent labs & imaging results that were available during my care of the patient were reviewed by me and considered in my medical decision making (see chart for details).    Left hip pain, possible GT bursitis. Left hip imaging negative. Prednisone prescribed.  She reports she cannot fill this until Wednesday due to finances.  She will follow up with orthopedics. Advised to apply ice to affected area.  Recommend stretching.    Final Clinical Impressions(s) / UC Diagnoses   Final diagnoses:  None   Discharge Instructions   None    ED Prescriptions   None    PDMP not reviewed this encounter.   Konrad Felix, PA-C 09/29/20 1811    Konrad Felix, PA-C 09/29/20 1817

## 2020-09-29 NOTE — ED Triage Notes (Signed)
Pt presents with left hip pain that radiates down in to upper left leg X 1 week, pt states it is unrelieved with OTC pain medication.  Pt had hip replacement on that side X 7 years ago.

## 2020-09-29 NOTE — Discharge Instructions (Addendum)
Apply ice to affected area Take prednisone as prescribed Can continue with Tramadol and Tylenol.  Follow up with orthopedics

## 2020-10-14 DIAGNOSIS — G8929 Other chronic pain: Secondary | ICD-10-CM | POA: Diagnosis not present

## 2020-10-14 DIAGNOSIS — M542 Cervicalgia: Secondary | ICD-10-CM | POA: Diagnosis not present

## 2020-10-14 DIAGNOSIS — M545 Low back pain, unspecified: Secondary | ICD-10-CM | POA: Diagnosis not present

## 2020-10-14 DIAGNOSIS — M25552 Pain in left hip: Secondary | ICD-10-CM | POA: Diagnosis not present

## 2020-10-14 DIAGNOSIS — M25511 Pain in right shoulder: Secondary | ICD-10-CM | POA: Diagnosis not present

## 2020-10-14 DIAGNOSIS — F1120 Opioid dependence, uncomplicated: Secondary | ICD-10-CM | POA: Diagnosis not present

## 2020-10-14 DIAGNOSIS — G894 Chronic pain syndrome: Secondary | ICD-10-CM | POA: Diagnosis not present

## 2020-10-14 DIAGNOSIS — Z79891 Long term (current) use of opiate analgesic: Secondary | ICD-10-CM | POA: Diagnosis not present

## 2020-10-14 DIAGNOSIS — M25512 Pain in left shoulder: Secondary | ICD-10-CM | POA: Diagnosis not present

## 2020-11-08 ENCOUNTER — Other Ambulatory Visit: Payer: Self-pay | Admitting: Nurse Practitioner

## 2020-12-01 ENCOUNTER — Encounter (HOSPITAL_COMMUNITY): Payer: Self-pay

## 2020-12-07 NOTE — Progress Notes (Signed)
Subjective:    Patient ID: [name] [name], female    DOB: 07-14-1950, 70 y.o.   MRN: [id number]  HPI: [name] [name] is a 70 y.o. female presenting for follow up.  Chief Complaint  Patient presents with   Insomnia    Follow up    COPD Reports COPD is controlled with rescue inhaler only.  Has not needed to use much in past few months.  COPD status: controlled Satisfied with current treatment?: yes Oxygen use: no Dyspnea frequency: a few times per month Cough frequency: daily Rescue inhaler frequency:  a few times per month Limitation of activity: no Productive cough: yes Last Spirometry: ?2015 at [facility] Pneumovax: Up to Date Influenza: Not up to Date  TOBACCO USER Smoking Status: current smoker Smoking Amount: 1.5 ppd Smoking Onset:  age 13 Smoking Quit Date: not interested in quitting Smoking triggers: stress Type of tobacco use:  cigarettes  INSOMNIA Currently taking Ambien 10 mg at night time and this allows her to sleep for about 3 hours.  She says her mind races when she tries to fall asleep. Duration: chronic Satisfied with sleep quality: no Difficulty falling asleep: yes Difficulty staying asleep: yes Waking a few hours after sleep onset: yes Early morning awakenings: yes Daytime hypersomnolence: no Wakes feeling refreshed: no Good sleep hygiene: no Depressed/anxious mood: yes Recent stress: yes Restless legs/nocturnal leg cramps: no Chronic pain/arthritis: yes History of sleep study: no  CHRONIC PAIN  She tells me today she is no longer taking the tramadol and wants to have good pain control without tramadol..  Her pain is well controlled with Tylenol.  She did recently finish a prednisone taper pack for acute left hip pain.  She tells me her low back pain today is a 2 out of 10.  However, she is interested to know what is causing her back pain.  She has never had imaging of her back.  She tells me she saw the pain management specialist and they  prescribed a "pain patch that did not have anything in it." Pain control status: controlled Duration: chronic Location: across low back, L>R Quality: pulling Current Pain Level: 2 out of 10 Previous Pain Level: Moderate to severe Breakthrough pain: no Benefit from narcotic medications: no What Activities task can be accomplished with current medication?:  Patient is tearful today, feels worried and stressed about her son who is incarcerated in [place].  She tells me recently, he has called her and has said some hurtful things that bother her and keep her up at night.  It sounds like she has 3 children, 2 of whom live close by and who she feels close with.  Patient does also report decreased appetite and not feeling hungry as a result of this.  She is not interested in starting medication or talking with a counselor at this time.  Allergies  Allergen Reactions   Ibuprofen Hives   Naproxen Sodium Hives   Aspirin Hives   Latex Rash   Vicodin [Hydrocodone-Acetaminophen] Hives   Hydrocodeine [Dihydrocodeine] Rash    Outpatient Encounter Medications as of 12/08/2020  Medication Sig   acetaminophen (TYLENOL) 500 MG tablet Take 500 mg by mouth every 6 (six) hours as needed.   omeprazole (PRILOSEC OTC) 20 MG tablet Take 20 mg by mouth daily.   albuterol (VENTOLIN HFA) 108 (90 Base) MCG/ACT inhaler Inhale 2 puffs into the lungs every 6 (six) hours as needed for wheezing or shortness of breath.   atorvastatin (LIPITOR) 40  MG tablet Take 1 tablet by mouth once daily   EUTHYROX 75 MCG tablet Take 1 tablet by mouth once daily   furosemide (LASIX) 20 MG tablet TAKE 1 TABLET BY MOUTH ONCE DAILY AS NEEDED   [START ON 12/12/2020] zolpidem (AMBIEN) 10 MG tablet Take 1 tablet (10 mg total) by mouth at bedtime as needed for sleep. for sleep   [DISCONTINUED] predniSONE (STERAPRED UNI-PAK 21 TAB) 10 MG (21) TBPK tablet Take by mouth daily. Take 6 tabs by mouth daily  for 2 days, then 5 tabs for 2 days, then  4 tabs for 2 days, then 3 tabs for 2 days, 2 tabs for 2 days, then 1 tab by mouth daily for 2 days   [DISCONTINUED] traMADol (ULTRAM) 50 MG tablet Take 1 tablet (50 mg total) by mouth 2 (two) times daily as needed for severe pain.   [DISCONTINUED] zolpidem (AMBIEN) 10 MG tablet Take 1 tablet (10 mg total) by mouth at bedtime as needed for sleep. for sleep   No facility-administered encounter medications on file as of 12/08/2020.    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   Aortic atherosclerosis ([place]) 03/02/2019   Hypothyroidism 08/17/2018   Osteopenia 09/13/2016   Special screening for malignant neoplasms, colon 08/11/2016   Borderline diabetes 08/02/2016   Insomnia 09/09/2014   Urinary incontinence 09/09/2014   Chronic diarrhea 01/16/2014   GERD (gastroesophageal reflux disease) 01/16/2014   GAD (generalized anxiety disorder) 01/16/2014   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/2014   Graves' eye disease 11/22/2013   DCIS (ductal carcinoma in situ) 11/09/2011   TOBACCO ABUSE 02/25/2010   MDD (major depressive disorder) 02/25/2010   Hyperlipidemia 05/20/2009   Anxiety state 05/20/2009   OA (osteoarthritis) of hip 05/20/2009   Chronic obstructive pulmonary disease with acute exacerbation ([place]) 05/20/2009    Past Medical History:  Diagnosis Date   Anxiety    Arthritis    Breast cancer ([place])    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 Interim medical history since our last visit reviewed.  Review of Systems Per HPI unless specifically indicated above     Objective:    BP (!) 178/82   Pulse 93   Temp 98.2 F (36.8 C) (Oral)   Ht 5\' 8"  (1.727 m)   Wt 178 lb 6.4 oz (80.9 kg)   SpO2 95%   BMI 27.13 kg/m   Wt Readings from Last 3 Encounters:  12/08/20 178 lb 6.4 oz (80.9 kg)  09/08/20 183 lb (83 kg)  02/15/20 191 lb 9.6 oz (86.9 kg)    Physical Exam Vitals and nursing note reviewed.  Constitutional:      General: She is not in acute distress.    Appearance: Normal appearance. She is obese. She is not toxic-appearing.  HENT:     Head: Normocephalic and atraumatic.  Eyes:     General: No scleral icterus.    Comments: Exophthalmus of left eye  Neck:     Vascular: No carotid bruit.  Cardiovascular:     Rate and Rhythm: Normal rate and regular rhythm.     Heart sounds: Normal heart sounds. No murmur heard. Pulmonary:     Effort: Pulmonary effort is normal. No respiratory distress.     Breath sounds: Normal breath sounds. No wheezing, rhonchi or rales.  Abdominal:     General: Abdomen is flat. Bowel sounds are normal.     Palpations: Abdomen is soft.     Tenderness: There is no abdominal tenderness.  Musculoskeletal:        General: No swelling. Normal range of motion.     Cervical back: Normal range of motion.     Right lower leg: No edema.     Left lower leg: No edema.  Skin:    General: Skin is warm and dry.     Capillary Refill: Capillary refill takes less than 2 seconds.     Coloration: Skin is not jaundiced.     Findings: No bruising.  Neurological:     General: No focal deficit present.     Mental Status: She is alert and oriented to person, place, and time.     Motor: No weakness.     Gait: Gait normal.  Psychiatric:        Mood and Affect: Mood normal.        Behavior: Behavior normal.        Thought Content: Thought  content normal.        Judgment: Judgment normal.      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  Plan to continue albuterol inhaler as needed.  Patient will let us know with any acute onset of worsening shortness of breath.  Follow-up in 3 months.        Nervous and Auditory   Acute bilateral low back pain with left-sided sciatica    Acute on chronic.  Pain is stable today.  However, will obtain lumbar x-ray to evaluate for possible degenerative disc disease versus compression fracture.  If pain acutely worsens, may benefit from neurosurgery consultation.      Relevant Medications   zolpidem (AMBIEN) 10 MG tablet (Start on 12/12/2020)   acetaminophen (TYLENOL) 500 MG tablet   Other Relevant Orders   DG Lumbar Spine Complete     Other   Severe episode of recurrent major depressive disorder, without psychotic features ([place])    Chronic.  Patient appears to be extremely worried and tearful on examination today.  I also worried because her appetite is decreased, she is only eating snacks at nighttime.  I recommended starting on medication to help with her depression, I also recommended seeing a therapist.  Patient declines both of these today.  Reports she has taken Zoloft in the past and did not like how it made her feel.  She denies any suicidal ideation today.  We will be available to patient if she changes her mind.  Follow-up in 3 months.      Insomnia    Chronic.  Plan to continue Ambien 10 mg daily.  Pt is aware of risks of psychoactive medication use to include increased sedation, respiratory suppression, falls, extrapyramidal movements,  dependence and cardiovascular events.  Pt would  like to continue treatment as benefit determined to outweigh risk.  PDMP reviewed and appropriate.  Controlled substance agreement has been signed at last visit.  We will check urine drug screen today.   Follow-up in 3 months.       Relevant Medications   zolpidem (AMBIEN) 10 MG tablet (Start on 12/12/2020)   Other Relevant Orders   DRUG MONITOR, PANEL 1, W/CONF, URINE   Other Visit Diagnoses     Current smoker       Relevant Orders   CT CHEST LUNG CA SCREEN LOW DOSE W/O CM   Encounter for screening for malignant neoplasm of lung in current smoker with 30 pack year history or greater       Relevant Orders   CT CHEST LUNG CA SCREEN LOW DOSE W/O CM   Weight loss       Ongoing.  Suspect related to stress.  She is overdue for lung cancer screening, ordered today.  No B symptoms.  She is UTD on other cancer screenings.        Follow up plan: Return in about 3 months (around 03/09/2021).

## 2020-12-08 ENCOUNTER — Other Ambulatory Visit: Payer: Self-pay

## 2020-12-08 ENCOUNTER — Encounter: Payer: Self-pay | Admitting: Nurse Practitioner

## 2020-12-08 ENCOUNTER — Ambulatory Visit (INDEPENDENT_AMBULATORY_CARE_PROVIDER_SITE_OTHER): Payer: Medicare HMO | Admitting: Nurse Practitioner

## 2020-12-08 VITALS — BP 178/82 | HR 93 | Temp 98.2°F | Ht 68.0 in | Wt 178.4 lb

## 2020-12-08 DIAGNOSIS — F17200 Nicotine dependence, unspecified, uncomplicated: Secondary | ICD-10-CM | POA: Diagnosis not present

## 2020-12-08 DIAGNOSIS — Z5181 Encounter for therapeutic drug level monitoring: Secondary | ICD-10-CM | POA: Diagnosis not present

## 2020-12-08 DIAGNOSIS — J431 Panlobular emphysema: Secondary | ICD-10-CM

## 2020-12-08 DIAGNOSIS — R634 Abnormal weight loss: Secondary | ICD-10-CM

## 2020-12-08 DIAGNOSIS — F5101 Primary insomnia: Secondary | ICD-10-CM

## 2020-12-08 DIAGNOSIS — Z122 Encounter for screening for malignant neoplasm of respiratory organs: Secondary | ICD-10-CM

## 2020-12-08 DIAGNOSIS — F332 Major depressive disorder, recurrent severe without psychotic features: Secondary | ICD-10-CM

## 2020-12-08 DIAGNOSIS — R69 Illness, unspecified: Secondary | ICD-10-CM | POA: Diagnosis not present

## 2020-12-08 DIAGNOSIS — M5442 Lumbago with sciatica, left side: Secondary | ICD-10-CM | POA: Diagnosis not present

## 2020-12-08 MED ORDER — ZOLPIDEM TARTRATE 10 MG PO TABS: 10.0000 mg | ORAL_TABLET | Freq: Every evening | ORAL | 2 refills | Status: DC | PRN

## 2020-12-08 NOTE — Patient Instructions (Addendum)
Lidocaine patches   Call me in 2 weeks with blood pressure readings.  Goal is less than 140/90.

## 2020-12-09 DIAGNOSIS — J431 Panlobular emphysema: Secondary | ICD-10-CM | POA: Insufficient documentation

## 2020-12-09 DIAGNOSIS — F332 Major depressive disorder, recurrent severe without psychotic features: Secondary | ICD-10-CM | POA: Insufficient documentation

## 2020-12-09 DIAGNOSIS — M5442 Lumbago with sciatica, left side: Secondary | ICD-10-CM | POA: Insufficient documentation

## 2020-12-09 LAB — DRUG MONITOR, PANEL 1, W/CONF, URINE
Amphetamines: NEGATIVE ng/mL (ref <500)
Barbiturates: NEGATIVE ng/mL (ref <300)
Benzodiazepines: NEGATIVE ng/mL (ref <100)
Cocaine Metabolite: NEGATIVE ng/mL (ref <150)
Creatinine: 40.4 mg/dL (ref >=20.0)
Marijuana Metabolite: NEGATIVE ng/mL (ref <20)
Methadone Metabolite: NEGATIVE ng/mL (ref <100)
Opiates: NEGATIVE ng/mL (ref <100)
Oxidant: NEGATIVE ug/mL (ref <200)
Oxycodone: NEGATIVE ng/mL (ref <100)
Phencyclidine: NEGATIVE ng/mL (ref <25)
pH: 7 (ref 4.5–9.0)

## 2020-12-09 LAB — DM TEMPLATE

## 2020-12-09 NOTE — Assessment & Plan Note (Signed)
Chronic.  Patient appears to be extremely worried and tearful on examination today.  I also worried because her appetite is decreased, she is only eating snacks at nighttime.  I recommended starting on medication to help with her depression, I also recommended seeing a therapist.  Patient declines both of these today.  Reports she has taken Zoloft in the past and did not like how it made her feel.  She denies any suicidal ideation today.  We will be available to patient if she changes her mind.  Follow-up in 3 months.

## 2020-12-09 NOTE — Assessment & Plan Note (Signed)
Chronic, stable.  Plan to continue albuterol inhaler as needed.  Patient will let us know with any acute onset of worsening shortness of breath.  Follow-up in 3 months.

## 2020-12-09 NOTE — Assessment & Plan Note (Signed)
Acute on chronic.  Pain is stable today.  However, will obtain lumbar x-ray to evaluate for possible degenerative disc disease versus compression fracture.  If pain acutely worsens, may benefit from neurosurgery consultation.

## 2020-12-09 NOTE — Assessment & Plan Note (Signed)
Chronic.  Plan to continue Ambien 10 mg daily.  Pt is aware of risks of psychoactive medication use to include increased sedation, respiratory suppression, falls, extrapyramidal movements,  dependence and cardiovascular events.  Pt would like to continue treatment as benefit determined to outweigh risk.  PDMP reviewed and appropriate.  Controlled substance agreement has been signed at last visit.  We will check urine drug screen today.  Follow-up in 3 months.

## 2020-12-10 ENCOUNTER — Other Ambulatory Visit: Payer: Self-pay

## 2020-12-10 ENCOUNTER — Ambulatory Visit
Admission: RE | Admit: 2020-12-10 | Discharge: 2020-12-10 | Disposition: A | Discharge status: Home / Self Care | Payer: Medicare HMO | Source: Ambulatory Visit | Attending: Nurse Practitioner | Admitting: Nurse Practitioner

## 2020-12-10 ENCOUNTER — Ambulatory Visit: Payer: Medicare HMO | Admitting: Nurse Practitioner

## 2020-12-10 DIAGNOSIS — M5442 Lumbago with sciatica, left side: Secondary | ICD-10-CM

## 2020-12-10 DIAGNOSIS — M545 Low back pain, unspecified: Secondary | ICD-10-CM | POA: Diagnosis not present

## 2020-12-15 ENCOUNTER — Telehealth: Payer: Self-pay

## 2020-12-15 NOTE — Telephone Encounter (Signed)
Please refer to lab/ imaging results for further documentation.

## 2020-12-15 NOTE — Telephone Encounter (Signed)
Pt called in wanting to discuss her xray results. Pt would like to get a call back if possible about these results. Please advise.  Cb#: [phone]

## 2020-12-24 ENCOUNTER — Ambulatory Visit
Admission: RE | Admit: 2020-12-24 | Discharge: 2020-12-24 | Disposition: A | Discharge status: Home / Self Care | Payer: Medicare HMO | Source: Ambulatory Visit | Attending: Nurse Practitioner | Admitting: Nurse Practitioner

## 2020-12-24 DIAGNOSIS — R69 Illness, unspecified: Secondary | ICD-10-CM | POA: Diagnosis not present

## 2020-12-24 DIAGNOSIS — F17200 Nicotine dependence, unspecified, uncomplicated: Secondary | ICD-10-CM

## 2020-12-24 DIAGNOSIS — F17210 Nicotine dependence, cigarettes, uncomplicated: Secondary | ICD-10-CM | POA: Diagnosis not present

## 2021-01-19 ENCOUNTER — Ambulatory Visit: Payer: Medicare HMO | Admitting: Physician Assistant

## 2021-01-19 ENCOUNTER — Ambulatory Visit (HOSPITAL_COMMUNITY)
Admission: RE | Admit: 2021-01-19 | Discharge: 2021-01-19 | Disposition: A | Discharge status: Home / Self Care | Payer: Medicare HMO | Source: Ambulatory Visit | Attending: Surgery | Admitting: Surgery

## 2021-01-19 ENCOUNTER — Other Ambulatory Visit: Payer: Self-pay

## 2021-01-19 VITALS — BP 178/77 | HR 80 | Temp 98.1°F | Resp 20 | Ht 68.0 in | Wt 178.0 lb

## 2021-01-19 DIAGNOSIS — I8312 Varicose veins of left lower extremity with inflammation: Secondary | ICD-10-CM

## 2021-01-19 DIAGNOSIS — Q278 Other specified congenital malformations of peripheral vascular system: Secondary | ICD-10-CM | POA: Diagnosis not present

## 2021-01-19 NOTE — Progress Notes (Signed)
VASCULAR & VEIN SPECIALISTS           OF [place]  History and Physical   [name] [name] is a 70 y.o. female who was seen 02/15/2020 with leg swelling.  In May 2021, she had venous duplex in May that revealed no DVT.  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  She had skin thinkening and SQ soft tissue swelling/edema/fluid mainly involved in the lower aspect of the left leg suggesting cellulitis or possible venous congestion or CHF.  At that time, she was treated for cellulitis and given lasix.  When she stopped the lasix, the nodules came back up with swelling.  she was not having any pain in the area.    She does have history of Skin Cancer, Breast Cancer and Cervical Cancer  It was felt that the nodules may be related to her Graves Disease and biopsy was discussed but did not recommend it at the time as she was not having any issues from this.  It was not felt it was anything malignant.  The pt was scheduled for return visit for follow up and she is here today.    She states she does not have any leg swelling.  She does not have any pain in the left popliteal fossa.  She does have pain in her back and has a kidney stone that she feels is causing her pain.  The patient has no history of DVT. Pt does not have history of varicose vein.   Pt does not have history of skin changes in lower legs.     The pt is on a statin for cholesterol management.  The pt is not on a daily aspirin.   Other AC:  none The pt is not on medication for hypertension.   The pt is not diabetic.   Tobacco hx:  current-she states she has stressors with her son being in jail for drugs.     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   Cancer [facility])    cervix   Chronic sinusitis     [facility] ENT   COPD (chronic obstructive pulmonary disease) ([place])    PFT 2011   Depression    Hypothyroidism    Keratoconjunctivitis    Right eye   Thyroid disease    Grave disease    Past Surgical History:  Procedure Laterality Date   ABDOMINAL HYSTERECTOMY     cervical cancer   BREAST LUMPECTOMY Right 11/25/2006   malignant   BREAST SURGERY     COLONOSCOPY N/A 12/15/2016   Procedure: COLONOSCOPY;  Surgeon: Rogene Houston, MD;  Location: [facility];  Service: Endoscopy;  Laterality: N/A;  930   ECTOPIC PREGNANCY SURGERY     POLYPECTOMY  12/15/2016   Procedure: POLYPECTOMY;  Surgeon: Rogene Houston, MD;  Location: [facility];  Service: Endoscopy;;  ascending, cecal, recto-sigmoid   TOTAL HIP ARTHROPLASTY Left     Social History   Socioeconomic History   Marital status: Married    Spouse name: Not on file   Number of children: 3   Years of education: Not on file   Highest education level: Not on  file  Occupational History   Not on file  Tobacco Use   Smoking status: Every Day    Packs/day: 1.50    Years: 40.00    Pack years: 60.00    Types: Cigarettes   Smokeless tobacco: Never   Tobacco comments:    quit 11/2013  Vaping Use   Vaping Use: Some days  Substance and Sexual Activity   Alcohol use: Yes    Alcohol/week: 0.0 standard drinks    Comment: 1/month   Drug use: No   Sexual activity: Not Currently    Birth control/protection: Surgical  Other Topics Concern   Not on file  Social History Narrative   Not on file   Social Determinants of Health   Financial Resource Strain: Not on file  Food Insecurity: Not on file  Transportation Needs: Not on file  Physical Activity: Not on file  Stress: Not on file  Social Connections: Not on file  Intimate Partner Violence: Not on file    Family History  Problem Relation Age of Onset   Cancer Mother        lung   Cancer Sister        ?lymphoma   Diabetes Maternal Grandmother    Dementia  Father    Colon cancer Neg Hx     Current Outpatient Medications  Medication Sig Dispense Refill   acetaminophen (TYLENOL) 500 MG tablet Take 500 mg by mouth every 6 (six) hours as needed.     albuterol (VENTOLIN HFA) 108 (90 Base) MCG/ACT inhaler Inhale 2 puffs into the lungs every 6 (six) hours as needed for wheezing or shortness of breath. 1 each 1   atorvastatin (LIPITOR) 40 MG tablet Take 1 tablet by mouth once daily 90 tablet 3   EUTHYROX 75 MCG tablet Take 1 tablet by mouth once daily 90 tablet 0   furosemide (LASIX) 20 MG tablet TAKE 1 TABLET BY MOUTH ONCE DAILY AS NEEDED 90 tablet 0   omeprazole (PRILOSEC OTC) 20 MG tablet Take 20 mg by mouth daily.     zolpidem (AMBIEN) 10 MG tablet Take 1 tablet (10 mg total) by mouth at bedtime as needed for sleep. for sleep 30 tablet 2   No current facility-administered medications for this visit.    Allergies  Allergen Reactions   Ibuprofen Hives   Naproxen Sodium Hives   Aspirin Hives   Latex Rash   Vicodin [Hydrocodone-Acetaminophen] Hives   Hydrocodeine [Dihydrocodeine] Rash    REVIEW OF SYSTEMS:   [X]  denotes positive finding, [ ]  denotes negative finding Cardiac  Comments:  Chest pain or chest pressure:    Shortness of breath upon exertion:    Short of breath when lying flat:    Irregular heart rhythm:        Vascular    Pain in calf, thigh, or hip brought on by ambulation:    Pain in feet at night that wakes you up from your sleep:     Blood clot in your veins:    Leg swelling:         Pulmonary    Oxygen at home:    Productive cough:     Wheezing:         Neurologic    Sudden weakness in arms or legs:     Sudden numbness in arms or legs:     Sudden onset of difficulty speaking or slurred speech:    Temporary loss of vision in one eye:  Problems with dizziness:         Gastrointestinal    Blood in stool:     Vomited blood:         Genitourinary    Burning when urinating:     Blood in urine:         Psychiatric    Major depression:         Hematologic    Bleeding problems:    Problems with blood clotting too easily:        Skin    Rashes or ulcers:        Constitutional    Fever or chills:      PHYSICAL EXAMINATION:  Today's Vitals   01/19/21 1421  BP: (!) 178/77  Pulse: 80  Resp: 20  Temp: 98.1 F (36.7 C)  TempSrc: Temporal  SpO2: 97%  Weight: 178 lb (80.7 kg)  Height: 5\' 8"  (1.727 m)  PainSc: 3   PainLoc: Leg   Body mass index is 27.06 kg/m.   General:  WDWN in NAD; vital signs documented above Gait: Not observed HENT: WNL, normocephalic Pulmonary: normal non-labored breathing without wheezing Cardiac: regular HR; without carotid bruits Abdomen: soft, NT, no masses; aortic pulse is not palpable Skin: without rashes Vascular Exam/Pulses:  Right Left  Radial 2+ (normal) 2+ (normal)  DP 2+ (normal) 2+ (normal)   Extremities: no swelling in either leg.  The area in the left popliteal fossa is prominent.    Neurologic: A&O X 3;  moving all extremities equally Psychiatric:  The pt has Normal affect.   Non-Invasive Vascular Imaging:   Venous duplex on 01/19/2021: +--------------+---------+------+-----------+------------+--------+  LEFT          Reflux NoRefluxReflux TimeDiameter cmsComments                          Yes                                   +--------------+---------+------+-----------+------------+--------+  CFV           no                                              +--------------+---------+------+-----------+------------+--------+  FV mid        no                                              +--------------+---------+------+-----------+------------+--------+  Popliteal     no                                              +--------------+---------+------+-----------+------------+--------+  GSV at SFJ              yes    >500 ms      0.52               +--------------+---------+------+-----------+------------+--------+  GSV prox thighno  0.33              +--------------+---------+------+-----------+------------+--------+  GSV mid thigh no                            0.35              +--------------+---------+------+-----------+------------+--------+  GSV dist thighno                            0.30              +--------------+---------+------+-----------+------------+--------+  GSV at knee   no                            0.28              +--------------+---------+------+-----------+------------+--------+  GSV prox calf           yes    >500 ms      0.22              +--------------+---------+------+-----------+------------+--------+  SSV Pop Fossa no                            0.20              +--------------+---------+------+-----------+------------+--------+  SSV prox calf no                            0.22              +--------------+---------+------+-----------+------------+--------+  SSV mid calf  no                            0.27              +--------------+---------+------+-----------+------------+--------+   Summary:  Left:  - No evidence of deep vein thrombosis seen in the left lower extremity,  from the common femoral through the popliteal veins.  - No evidence of superficial venous thrombosis in the left lower  extremity.     - Venous reflux is noted in the left sapheno-femoral junction.  - Venous reflux is noted in the left greater saphenous vein in the  proximal calf.     - Vascularized structure in the popliteal fossa measuring 1.42 x 1.73 cm  in the sagittal plane. Etiology unknown.    BIBIANA GILLEAN is a 70 y.o. female who presents with: hx of LLE swelling and possible  popliteal fossa varix  -pt has easily palpable pedal pulses -pt does not have evidence of DVT.  Pt does have have venous reflux at the [facility] at the [facility] and  in the proximal calf.   She is currently not having any swelling.  -the structure in the left popliteal fossa is essentially unchanged and is not bothersome to her.  Discussed with her that we will see her in a year with reflux study but will see her sooner if she has any issues or if this area becomes bothersome.   She is in agreement with this plan. -discussed the importance of smoking cessation with patient.     Leontine Locket, [facility] Vein Specialists 01/19/2021 3:43 PM  Clinic MD:  Trula Slade

## 2021-02-05 ENCOUNTER — Other Ambulatory Visit: Payer: Self-pay | Admitting: Nurse Practitioner

## 2021-03-11 ENCOUNTER — Ambulatory Visit: Payer: Medicare HMO | Admitting: Nurse Practitioner

## 2021-03-18 ENCOUNTER — Other Ambulatory Visit: Payer: Self-pay

## 2021-03-18 ENCOUNTER — Encounter: Payer: Self-pay | Admitting: Nurse Practitioner

## 2021-03-18 ENCOUNTER — Other Ambulatory Visit: Payer: Self-pay | Admitting: Nurse Practitioner

## 2021-03-18 ENCOUNTER — Ambulatory Visit (INDEPENDENT_AMBULATORY_CARE_PROVIDER_SITE_OTHER): Payer: Medicare HMO | Admitting: Nurse Practitioner

## 2021-03-18 VITALS — BP 162/82 | HR 86 | Ht 68.0 in | Wt 183.0 lb

## 2021-03-18 DIAGNOSIS — E039 Hypothyroidism, unspecified: Secondary | ICD-10-CM | POA: Diagnosis not present

## 2021-03-18 DIAGNOSIS — E782 Mixed hyperlipidemia: Secondary | ICD-10-CM | POA: Diagnosis not present

## 2021-03-18 DIAGNOSIS — F5101 Primary insomnia: Secondary | ICD-10-CM

## 2021-03-18 DIAGNOSIS — R69 Illness, unspecified: Secondary | ICD-10-CM | POA: Diagnosis not present

## 2021-03-18 DIAGNOSIS — R7303 Prediabetes: Secondary | ICD-10-CM

## 2021-03-18 DIAGNOSIS — I10 Essential (primary) hypertension: Secondary | ICD-10-CM

## 2021-03-18 DIAGNOSIS — E0500 Thyrotoxicosis with diffuse goiter without thyrotoxic crisis or storm: Secondary | ICD-10-CM

## 2021-03-18 DIAGNOSIS — J410 Simple chronic bronchitis: Secondary | ICD-10-CM | POA: Diagnosis not present

## 2021-03-18 DIAGNOSIS — I700 Atherosclerosis of aorta: Secondary | ICD-10-CM | POA: Diagnosis not present

## 2021-03-18 MED ORDER — AMLODIPINE BESYLATE 5 MG PO TABS: 5.0000 mg | ORAL_TABLET | Freq: Every day | ORAL | 1 refills | Status: DC

## 2021-03-18 NOTE — Progress Notes (Signed)
Subjective:    Patient ID: [name] [name], female    DOB: Jan 19, 1951, 71 y.o.   MRN: [id number]  HPI: [name] [name] is a 71 y.o. female presenting for follow up.  Chief Complaint  Patient presents with   Hyperlipidemia   Emphysema   COPD Patient reports her breathing feels stable.  Has not had to use rescue inhaler.   COPD status: stable Satisfied with current treatment?: yes Oxygen use: no Dyspnea frequency: rarely Cough frequency: daily, productive sounding, not bothersome Rescue inhaler frequency: rarely   Limitation of activity: no Productive cough: yes Last Spirometry: n/a Pneumovax: Not up to Date Influenza: Not up to Date  Reports she has had multiple losses in the past few months.  This has been a trying few months for her.   HYPERTENSION/HYPERLIPIDEMIA She is currently taking amlodipine 5 mg daily for blood pressure.  She reports she smoked a cigarette on her way to the office today. She is interested in quitting smoking but does not think that is feasible with the stress in her life currently. Hypertension status: uncontrolled  BP monitoring frequency:  not checking Medication compliance: excellent Aspirin: no Recurrent headaches: no Visual changes: no Palpitations: no Dyspnea: no Chest pain: no Lower extremity edema: no Dizzy/lightheaded: no LDL goal: less than 70  HYPOTHYROIDISM She is currently taking levothyroxine 75 mcg daily and reports she is tolerating this well. Thyroid control status:stable Satisfied with current treatment? yes Medication side effects: no Medication compliance: excellent compliance Recent dose adjustment:no Fatigue: yes Cold intolerance: no Heat intolerance: no Weight gain: no Weight loss: no Constipation: no Diarrhea/loose stools: no Palpitations: no Lower extremity edema: no Anxiety/depressed mood: yes  Patient is also requesting a refill of the Ambien 10 mg daily.  She takes this every night for sleep.  She  has not had any falls and reports she has been on this medication for years without issue.  She is hesitant to adjusting the dose of this medication.   PRE-DIABETES Last A1c had increased to 6.2% about 6 months ago. Hypoglycemic episodes:no Polydipsia/polyuria: no Visual disturbance: no Chest pain: no Paresthesias: no Glucose Monitoring: no  Allergies  Allergen Reactions   Ibuprofen Hives   Naproxen Sodium Hives   Aspirin Hives   Latex Rash   Vicodin [Hydrocodone-Acetaminophen] Hives   Hydrocodeine [Dihydrocodeine] Rash    Outpatient Encounter Medications as of 03/18/2021  Medication Sig   acetaminophen (TYLENOL) 500 MG tablet Take 500 mg by mouth every 6 (six) hours as needed.   albuterol (VENTOLIN HFA) 108 (90 Base) MCG/ACT inhaler Inhale 2 puffs into the lungs every 6 (six) hours as needed for wheezing or shortness of breath.   amLODipine (NORVASC) 5 MG tablet Take 1 tablet (5 mg total) by mouth daily.   atorvastatin (LIPITOR) 40 MG tablet Take 1 tablet by mouth once daily   furosemide (LASIX) 20 MG tablet TAKE 1 TABLET BY MOUTH ONCE DAILY AS NEEDED   omeprazole (PRILOSEC OTC) 20 MG tablet Take 20 mg by mouth daily.   [DISCONTINUED] levothyroxine (SYNTHROID) 75 MCG tablet Take 1 tablet by mouth once daily   [DISCONTINUED] zolpidem (AMBIEN) 10 MG tablet Take 1 tablet (10 mg total) by mouth at bedtime as needed for sleep. for sleep   levothyroxine (SYNTHROID) 50 MCG tablet Take 1 tablet (50 mcg total) by mouth daily.   zolpidem (AMBIEN) 10 MG tablet Take 1 tablet (10 mg total) by mouth at bedtime as needed for sleep. for sleep   No  facility-administered encounter medications on file as of 03/18/2021.    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   Aortic atherosclerosis ([place]) 03/02/2019   Hypothyroidism 08/17/2018   Osteopenia 09/13/2016   Special screening for malignant neoplasms, colon 08/11/2016   Borderline diabetes 08/02/2016   Insomnia 09/09/2014   Urinary incontinence 09/09/2014   Chronic diarrhea 01/16/2014   GERD (gastroesophageal reflux disease) 01/16/2014   GAD (generalized anxiety disorder) 01/16/2014   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/2014   Systemic lupus erythematosus (SLE) inhibitor ([place]) 01/16/2014   Graves' eye disease 11/22/2013   DCIS (ductal carcinoma in situ) 11/09/2011   TOBACCO ABUSE 02/25/2010   MDD (major depressive disorder) 02/25/2010   Hyperlipidemia 05/20/2009   Anxiety state 05/20/2009   OA (osteoarthritis) of hip 05/20/2009   Chronic obstructive pulmonary disease with acute exacerbation ([place]) 05/20/2009    Past Medical History:  Diagnosis Date   Anxiety    Arthritis    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 Interim medical history since our last visit reviewed.  Review of Systems Per HPI unless specifically indicated above     Objective:    BP (!) 162/82    Pulse 86    Ht 5\' 8"  (1.727 m)    Wt 183 lb (83 kg)    SpO2 95%    BMI 27.83 kg/m   Wt Readings from Last 3 Encounters:  03/18/21 183 lb (83 kg)  01/19/21 178 lb (80.7 kg)  12/08/20 178 lb 6.4 oz (80.9 kg)    Physical Exam Vitals and nursing note reviewed.  Constitutional:      General: She is not in acute distress.    Appearance: Normal  appearance. She is obese. She is not toxic-appearing.  HENT:     Head: Normocephalic and atraumatic.  Eyes:     General: No scleral icterus.    Comments: Exophthalmus bilaterally  Neck:     Vascular: No carotid bruit.  Cardiovascular:     Rate and Rhythm: Normal rate and regular rhythm.     Heart sounds: Normal heart sounds. No murmur heard. Pulmonary:     Effort: Pulmonary effort is normal. No respiratory distress.     Breath sounds: Wheezing present. No rhonchi or rales.  Musculoskeletal:     Cervical back: Normal range of motion.     Right lower leg: No edema.     Left lower leg: No edema.  Skin:    General: Skin is warm and dry.     Capillary Refill: Capillary refill takes less than 2 seconds.     Coloration: Skin is not jaundiced.     Findings: No bruising.  Neurological:     Mental Status: She is alert and oriented to person, place,  and time.     Motor: No weakness.     Gait: Gait normal.      Assessment & Plan:   Problem List Items Addressed This Visit       Cardiovascular and Mediastinum   Primary hypertension    BP remains elevated today.  I have asked the patient to log her blood pressure at home and she has been unable to consistently.  We will start amlodipine 5 mg daily and follow up in 1 month.       Relevant Medications   amLODipine (NORVASC) 5 MG tablet   Aortic atherosclerosis (HCC)    Chronic.  Check fasting lipids with CMP today.  Continue atorvastatin 40 mg daily.  Follow up 3 months.       Relevant Medications   amLODipine (NORVASC) 5 MG tablet   Other Relevant Orders   Lipid panel (Completed)     Respiratory   Chronic obstructive pulmonary disease with acute exacerbation (HCC) - Primary    Chronic.  Well controlled with as needed rescue inhaler.  We discussed that if she is using more than 1-2 times weekly, we may need to add in maintenance inhaler.  She is audibly wheezing today and denies difficulty breathing or increased work of breathing.   Discussed continued smoking cessation however patient is in pre contemplative phase currently.         Endocrine   Hypothyroidism    Chronic.  Check TSH today.  Plan to continue levothyroxine at current dosing as long as TSH stable.      Relevant Medications   levothyroxine (SYNTHROID) 50 MCG tablet   Other Relevant Orders   TSH (Completed)   Graves' eye disease   Relevant Medications   levothyroxine (SYNTHROID) 50 MCG tablet   Other Relevant Orders   TSH (Completed)     Other   Insomnia    Chronic.  We discussed use of Ambien at length again today.  Again, I informed her doses higher than 5 mg are not recommended for her age group.  She is extremely resistant to cutting back on the medication.  Pt is aware of risks of psychoactive medication use to include increased sedation, respiratory suppression, falls, extrapyramidal movements,  dependence and cardiovascular events.  Pt would like to continue treatment as benefit determined to outweigh risk.  PDMP reviewed and appropriate and refill given for Ambien 10 mg daily as needed today.  Controlled substance agreement has been signed in the past and UDS is up to date.        Relevant Medications   zolpidem (AMBIEN) 10 MG tablet   Hyperlipidemia    Chronic.  Check fasting lipids with CMP today.  Continue atorvastatin 40 mg daily.  Follow up 3 months.       Relevant Medications   amLODipine (NORVASC) 5 MG tablet   Other Relevant Orders   COMPLETE METABOLIC PANEL WITH GFR (Completed)   Borderline diabetes    Check Hgba1c today.      Relevant Orders   Hemoglobin A1c (Completed)     Follow up plan: Return in about 4 weeks (around 04/15/2021) for BP follow up.

## 2021-03-19 ENCOUNTER — Encounter: Payer: Self-pay | Admitting: Nurse Practitioner

## 2021-03-19 DIAGNOSIS — I10 Essential (primary) hypertension: Secondary | ICD-10-CM | POA: Insufficient documentation

## 2021-03-19 HISTORY — DX: Essential (primary) hypertension: I10

## 2021-03-19 LAB — COMPLETE METABOLIC PANEL WITH GFR
AG Ratio: 1.6 (calc) (ref 1.0–2.5)
ALT: 12 U/L (ref 6–29)
AST: 15 U/L (ref 10–35)
Albumin: 4.4 g/dL (ref 3.6–5.1)
Alkaline phosphatase (APISO): 110 U/L (ref 37–153)
BUN: 11 mg/dL (ref 7–25)
CO2: 31 mmol/L (ref 20–32)
Calcium: 9.9 mg/dL (ref 8.6–10.4)
Chloride: 103 mmol/L (ref 98–110)
Creat: 0.62 mg/dL (ref 0.60–1.00)
Globulin: 2.8 g/dL (calc) (ref 1.9–3.7)
Glucose, Bld: 103 mg/dL — ABNORMAL HIGH (ref 65–99)
Potassium: 5.2 mmol/L (ref 3.5–5.3)
Sodium: 142 mmol/L (ref 135–146)
Total Bilirubin: 0.4 mg/dL (ref 0.2–1.2)
Total Protein: 7.2 g/dL (ref 6.1–8.1)
eGFR: 95 mL/min/{1.73_m2} (ref >=60)

## 2021-03-19 LAB — LIPID PANEL
Cholesterol: 180 mg/dL (ref <200)
HDL: 41 mg/dL — ABNORMAL LOW (ref >=50)
LDL Cholesterol (Calc): 109 mg/dL (calc) — ABNORMAL HIGH
Non-HDL Cholesterol (Calc): 139 mg/dL (calc) — ABNORMAL HIGH (ref <130)
Total CHOL/HDL Ratio: 4.4 (calc) (ref <5.0)
Triglycerides: 183 mg/dL — ABNORMAL HIGH (ref <150)

## 2021-03-19 LAB — HEMOGLOBIN A1C
Hgb A1c MFr Bld: 6 % of total Hgb — ABNORMAL HIGH (ref <5.7)
Mean Plasma Glucose: 126 mg/dL
eAG (mmol/L): 7 mmol/L

## 2021-03-19 LAB — TSH: TSH: 0.31 mIU/L — ABNORMAL LOW (ref 0.40–4.50)

## 2021-03-19 MED ORDER — ZOLPIDEM TARTRATE 10 MG PO TABS: 10.0000 mg | ORAL_TABLET | Freq: Every evening | ORAL | 2 refills | Status: DC | PRN

## 2021-03-19 MED ORDER — LEVOTHYROXINE SODIUM 50 MCG PO TABS: 50.0000 ug | ORAL_TABLET | Freq: Every day | ORAL | 1 refills | Status: DC

## 2021-03-19 NOTE — Assessment & Plan Note (Signed)
Chronic.  Check fasting lipids with CMP today.  Continue atorvastatin 40 mg daily.  Follow up 3 months.

## 2021-03-19 NOTE — Assessment & Plan Note (Signed)
Chronic.  Check TSH today.  Plan to continue levothyroxine at current dosing as long as TSH stable.

## 2021-03-19 NOTE — Assessment & Plan Note (Addendum)
Chronic.  We discussed use of Ambien at length again today.  Again, I informed her doses higher than 5 mg are not recommended for her age group.  She is extremely resistant to cutting back on the medication.  Pt is aware of risks of psychoactive medication use to include increased sedation, respiratory suppression, falls, extrapyramidal movements,  dependence and cardiovascular events.  Pt would like to continue treatment as benefit determined to outweigh risk.  PDMP reviewed and appropriate and refill given for Ambien 10 mg daily as needed today.  Controlled substance agreement has been signed in the past and UDS is up to date.

## 2021-03-19 NOTE — Assessment & Plan Note (Signed)
BP remains elevated today.  I have asked the patient to log her blood pressure at home and she has been unable to consistently.  We will start amlodipine 5 mg daily and follow up in 1 month.

## 2021-03-19 NOTE — Assessment & Plan Note (Signed)
Chronic.  Well controlled with as needed rescue inhaler.  We discussed that if she is using more than 1-2 times weekly, we may need to add in maintenance inhaler.  She is audibly wheezing today and denies difficulty breathing or increased work of breathing.  Discussed continued smoking cessation however patient is in pre contemplative phase currently.

## 2021-03-19 NOTE — Telephone Encounter (Signed)
Pt has called in 3x's to inquire about this refill being sent to pharmacy for zolpidem (AMBIEN) 10 MG tablet. Pt received her bp meds, and wants to know why other med has not been refilled yet. Please advise.  Cb#: [phone]

## 2021-03-19 NOTE — Assessment & Plan Note (Signed)
Check Hgba1c today.

## 2021-03-19 NOTE — Telephone Encounter (Signed)
Informed patient [name] [name] rx was sent to pharmacy.

## 2021-03-20 ENCOUNTER — Telehealth: Payer: Self-pay

## 2021-03-20 NOTE — Telephone Encounter (Signed)
Left message for patient to review results and recommendations.

## 2021-03-20 NOTE — Telephone Encounter (Signed)
-----   Message from Eulogio Bear, NP sent at 03/19/2021  3:58 PM EST ----- Please notify with labs.   - TSH shows that we are overtreating her thyroid.  I have sent in updated prescription, we need to decrease levothyroxine to 50 mcg daily. - cholesterol levels remain slightly elevated despite atorvastatin 40 mg daily.  Please have her increase to 80 mg daily.  - Hgba1c is 6.0% which has improved from last time.  Please continue watching intake of simple sugars and carbohydrates.  - Electrolytes, kidney function, and liver enzymes look excellent.

## 2021-03-23 NOTE — Telephone Encounter (Signed)
Spoke with pt and gave results and recommendations. Pt voiced understanding. Nothing further needed at this time.

## 2021-04-20 ENCOUNTER — Other Ambulatory Visit: Payer: Self-pay

## 2021-04-20 ENCOUNTER — Encounter: Payer: Self-pay | Admitting: Nurse Practitioner

## 2021-04-20 ENCOUNTER — Ambulatory Visit (INDEPENDENT_AMBULATORY_CARE_PROVIDER_SITE_OTHER): Payer: Medicare HMO | Admitting: Nurse Practitioner

## 2021-04-20 VITALS — BP 148/68 | HR 100 | Ht 68.0 in | Wt 183.0 lb

## 2021-04-20 DIAGNOSIS — I10 Essential (primary) hypertension: Secondary | ICD-10-CM

## 2021-04-20 DIAGNOSIS — I700 Atherosclerosis of aorta: Secondary | ICD-10-CM | POA: Diagnosis not present

## 2021-04-20 DIAGNOSIS — E782 Mixed hyperlipidemia: Secondary | ICD-10-CM | POA: Diagnosis not present

## 2021-04-20 MED ORDER — ATORVASTATIN CALCIUM 80 MG PO TABS: 80.0000 mg | ORAL_TABLET | Freq: Every day | ORAL | 1 refills | Status: AC

## 2021-04-20 MED ORDER — AMLODIPINE BESYLATE 5 MG PO TABS: 5.0000 mg | ORAL_TABLET | Freq: Every day | ORAL | 1 refills | Status: AC

## 2021-04-20 NOTE — Assessment & Plan Note (Signed)
Refills sent in for atorvastatin 80 mg daily.

## 2021-04-20 NOTE — Assessment & Plan Note (Signed)
Chronic.  BP much improved today in clinic.  Continue amlodipine 5 mg daily, refills sent into pharmacy.  Will defer further titration to new PCP.

## 2021-04-20 NOTE — Progress Notes (Signed)
Subjective:    Patient ID: [name] [name], female    DOB: 1950/10/23, 71 y.o.   MRN: [id number]  HPI: [name] [name] is a 71 y.o. female presenting for blood pressure follow up.  Chief Complaint  Patient presents with   Hypertension   HYPERTENSION Patient has been tolerating amlodipine 5 mg daily well.  She has not been checking BP at home.  She reports the headaches in the morning are better.  Hypertension status: better  BP monitoring frequency:  not checking Medication compliance: excellent Aspirin: no Recurrent headaches: no Visual changes: no Palpitations: no Dyspnea: no Chest pain: no Lower extremity edema: no Dizzy/lightheaded: no  She is tolerating increase in atorvastatin to 80 mg daily well.  We also decreased levothyroxine to 50 mcg daily after last visit; due for recheck later this month/early March.  Allergies  Allergen Reactions   Ibuprofen Hives   Naproxen Sodium Hives   Aspirin Hives   Latex Rash   Vicodin [Hydrocodone-Acetaminophen] Hives   Hydrocodeine [Dihydrocodeine] Rash    Outpatient Encounter Medications as of 04/20/2021  Medication Sig   acetaminophen (TYLENOL) 500 MG tablet Take 500 mg by mouth every 6 (six) hours as needed.   albuterol (VENTOLIN HFA) 108 (90 Base) MCG/ACT inhaler Inhale 2 puffs into the lungs every 6 (six) hours as needed for wheezing or shortness of breath.   furosemide (LASIX) 20 MG tablet TAKE 1 TABLET BY MOUTH ONCE DAILY AS NEEDED   levothyroxine (SYNTHROID) 50 MCG tablet Take 1 tablet (50 mcg total) by mouth daily.   omeprazole (PRILOSEC OTC) 20 MG tablet Take 20 mg by mouth daily.   zolpidem (AMBIEN) 10 MG tablet Take 1 tablet (10 mg total) by mouth at bedtime as needed for sleep. for sleep   [DISCONTINUED] amLODipine (NORVASC) 5 MG tablet Take 1 tablet (5 mg total) by mouth daily.   [DISCONTINUED] atorvastatin (LIPITOR) 40 MG tablet Take 80 mg by mouth daily.   amLODipine (NORVASC) 5 MG tablet Take 1 tablet (5 mg  total) by mouth daily.   atorvastatin (LIPITOR) 80 MG tablet Take 1 tablet (80 mg total) by mouth daily.   [DISCONTINUED] atorvastatin (LIPITOR) 40 MG tablet Take 1 tablet by mouth once daily (Patient taking differently: 80 mg.)   No facility-administered encounter medications on file as of 04/20/2021.    Patient Active Problem List   Diagnosis Date Noted   Primary hypertension 03/19/2021   Panlobular emphysema ([place]) 12/09/2020   Acute bilateral low back pain with left-sided sciatica 12/09/2020   Severe episode of recurrent major depressive disorder, without psychotic features ([place]) 12/09/2020   Controlled substance agreement signed 09/08/2020   Congenital venous varix 08/15/2019   Aortic atherosclerosis ([place]) 03/02/2019   Hypothyroidism 08/17/2018   Osteopenia 09/13/2016   Special screening for malignant neoplasms, colon 08/11/2016   Borderline diabetes 08/02/2016   Insomnia 09/09/2014   Urinary incontinence 09/09/2014   Chronic diarrhea 01/16/2014   GERD (gastroesophageal reflux disease) 01/16/2014   GAD (generalized anxiety disorder) 01/16/2014   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/2014   Systemic lupus erythematosus (SLE) inhibitor ([place]) 01/16/2014   Graves' eye disease 11/22/2013   DCIS (ductal carcinoma in situ) 11/09/2011   TOBACCO ABUSE 02/25/2010   MDD (major depressive disorder) 02/25/2010   Hyperlipidemia 05/20/2009   Anxiety state 05/20/2009   OA (osteoarthritis) of hip 05/20/2009   Chronic obstructive pulmonary disease with acute exacerbation ([place]) 05/20/2009    Past Medical History:  Diagnosis Date   Anxiety  Arthritis    Breast cancer [facility])    DCIS 2008- right   Cancer [facility])    cervix   Chronic sinusitis    [facility]   COPD (chronic obstructive pulmonary disease) ([place])    PFT 2011   Depression    Hypothyroidism    Keratoconjunctivitis    Right eye   Primary hypertension 03/19/2021   Thyroid disease    Grave disease    Relevant past medical,  surgical, family and social history reviewed and updated as indicated. Interim medical history since our last visit reviewed.  Review of Systems Per HPI unless specifically indicated above     Objective:    BP (!) 148/68    Pulse 100    Ht 5\' 8"  (1.727 m)    Wt 183 lb (83 kg)    SpO2 95%    BMI 27.83 kg/m   Wt Readings from Last 3 Encounters:  04/20/21 183 lb (83 kg)  03/18/21 183 lb (83 kg)  01/19/21 178 lb (80.7 kg)    Physical Exam Vitals and nursing note reviewed.  Constitutional:      General: She is not in acute distress.    Appearance: Normal appearance. She is not toxic-appearing.  Cardiovascular:     Rate and Rhythm: Normal rate and regular rhythm.     Heart sounds: Normal heart sounds. No murmur heard. Pulmonary:     Effort: Pulmonary effort is normal. No respiratory distress.     Breath sounds: Normal breath sounds. No wheezing, rhonchi or rales.  Skin:    General: Skin is warm and dry.     Coloration: Skin is not jaundiced or pale.     Findings: No erythema.  Neurological:     Mental Status: She is alert and oriented to person, place, and time.     Motor: No weakness.     Gait: Gait normal.       Assessment & Plan:   Problem List Items Addressed This Visit       Cardiovascular and Mediastinum   Primary hypertension    Chronic.  BP much improved today in clinic.  Continue amlodipine 5 mg daily, refills sent into pharmacy.  Will defer further titration to new PCP.        Relevant Medications   atorvastatin (LIPITOR) 80 MG tablet   amLODipine (NORVASC) 5 MG tablet   Aortic atherosclerosis ([facility]) - Primary    Refills sent in for atorvastatin 80 mg daily.        Relevant Medications   atorvastatin (LIPITOR) 80 MG tablet   amLODipine (NORVASC) 5 MG tablet     Other   Hyperlipidemia    Refills sent in for atorvastatin 80 mg daily.       Relevant Medications   atorvastatin (LIPITOR) 80 MG tablet   amLODipine (NORVASC) 5 MG tablet     Follow up  plan: Return for with new PCP.

## 2021-04-23 NOTE — Telephone Encounter (Signed)
Erroneous encounter. Please disregard.

## 2021-04-27 DIAGNOSIS — R7303 Prediabetes: Secondary | ICD-10-CM | POA: Diagnosis not present

## 2021-04-27 DIAGNOSIS — I10 Essential (primary) hypertension: Secondary | ICD-10-CM | POA: Diagnosis not present

## 2021-04-27 DIAGNOSIS — G4700 Insomnia, unspecified: Secondary | ICD-10-CM | POA: Diagnosis not present

## 2021-04-27 DIAGNOSIS — J439 Emphysema, unspecified: Secondary | ICD-10-CM | POA: Diagnosis not present

## 2021-04-27 DIAGNOSIS — Z720 Tobacco use: Secondary | ICD-10-CM | POA: Diagnosis not present

## 2021-04-27 DIAGNOSIS — E039 Hypothyroidism, unspecified: Secondary | ICD-10-CM | POA: Diagnosis not present

## 2021-04-27 DIAGNOSIS — I868 Varicose veins of other specified sites: Secondary | ICD-10-CM | POA: Diagnosis not present

## 2021-04-27 DIAGNOSIS — E785 Hyperlipidemia, unspecified: Secondary | ICD-10-CM | POA: Diagnosis not present

## 2021-05-06 ENCOUNTER — Other Ambulatory Visit: Payer: Self-pay | Admitting: Nurse Practitioner

## 2021-05-19 ENCOUNTER — Ambulatory Visit: Payer: Medicare HMO | Admitting: Nurse Practitioner

## 2021-05-26 DIAGNOSIS — I10 Essential (primary) hypertension: Secondary | ICD-10-CM | POA: Diagnosis not present

## 2021-05-26 DIAGNOSIS — M329 Systemic lupus erythematosus, unspecified: Secondary | ICD-10-CM | POA: Diagnosis not present

## 2021-05-26 DIAGNOSIS — E039 Hypothyroidism, unspecified: Secondary | ICD-10-CM | POA: Diagnosis not present

## 2021-05-26 DIAGNOSIS — R69 Illness, unspecified: Secondary | ICD-10-CM | POA: Diagnosis not present

## 2021-05-26 DIAGNOSIS — M25552 Pain in left hip: Secondary | ICD-10-CM | POA: Diagnosis not present

## 2021-05-26 DIAGNOSIS — Z1211 Encounter for screening for malignant neoplasm of colon: Secondary | ICD-10-CM | POA: Diagnosis not present

## 2021-05-26 DIAGNOSIS — E785 Hyperlipidemia, unspecified: Secondary | ICD-10-CM | POA: Diagnosis not present

## 2021-05-26 DIAGNOSIS — I700 Atherosclerosis of aorta: Secondary | ICD-10-CM | POA: Diagnosis not present

## 2021-05-26 DIAGNOSIS — Z0000 Encounter for general adult medical examination without abnormal findings: Secondary | ICD-10-CM | POA: Diagnosis not present

## 2021-05-26 DIAGNOSIS — J439 Emphysema, unspecified: Secondary | ICD-10-CM | POA: Diagnosis not present

## 2021-05-27 ENCOUNTER — Other Ambulatory Visit: Payer: Self-pay | Admitting: Family Medicine

## 2021-05-27 DIAGNOSIS — M8580 Other specified disorders of bone density and structure, unspecified site: Secondary | ICD-10-CM

## 2021-05-27 DIAGNOSIS — Z1231 Encounter for screening mammogram for malignant neoplasm of breast: Secondary | ICD-10-CM

## 2021-06-18 DIAGNOSIS — T1490XA Injury, unspecified, initial encounter: Secondary | ICD-10-CM | POA: Diagnosis not present

## 2021-06-18 DIAGNOSIS — R5381 Other malaise: Secondary | ICD-10-CM | POA: Diagnosis not present

## 2021-06-23 DIAGNOSIS — M25552 Pain in left hip: Secondary | ICD-10-CM | POA: Diagnosis not present

## 2021-06-23 DIAGNOSIS — M1612 Unilateral primary osteoarthritis, left hip: Secondary | ICD-10-CM | POA: Diagnosis not present

## 2021-06-23 DIAGNOSIS — Z720 Tobacco use: Secondary | ICD-10-CM | POA: Diagnosis not present

## 2021-06-24 ENCOUNTER — Ambulatory Visit
Admission: RE | Admit: 2021-06-24 | Discharge: 2021-06-24 | Disposition: A | Discharge status: Home / Self Care | Payer: Medicare HMO | Source: Ambulatory Visit | Attending: Family Medicine | Admitting: Family Medicine

## 2021-06-24 DIAGNOSIS — Z1231 Encounter for screening mammogram for malignant neoplasm of breast: Secondary | ICD-10-CM

## 2021-07-08 DIAGNOSIS — M25552 Pain in left hip: Secondary | ICD-10-CM | POA: Diagnosis not present

## 2021-07-08 DIAGNOSIS — Z96642 Presence of left artificial hip joint: Secondary | ICD-10-CM | POA: Diagnosis not present

## 2021-07-08 DIAGNOSIS — M7062 Trochanteric bursitis, left hip: Secondary | ICD-10-CM | POA: Diagnosis not present

## 2021-08-19 DIAGNOSIS — M48062 Spinal stenosis, lumbar region with neurogenic claudication: Secondary | ICD-10-CM | POA: Diagnosis not present

## 2021-08-19 DIAGNOSIS — M5136 Other intervertebral disc degeneration, lumbar region: Secondary | ICD-10-CM | POA: Diagnosis not present

## 2021-08-19 DIAGNOSIS — Z96642 Presence of left artificial hip joint: Secondary | ICD-10-CM | POA: Diagnosis not present

## 2021-08-19 DIAGNOSIS — M7062 Trochanteric bursitis, left hip: Secondary | ICD-10-CM | POA: Diagnosis not present

## 2021-09-14 DIAGNOSIS — R195 Other fecal abnormalities: Secondary | ICD-10-CM | POA: Diagnosis not present

## 2021-09-14 DIAGNOSIS — M25552 Pain in left hip: Secondary | ICD-10-CM | POA: Diagnosis not present

## 2021-09-14 DIAGNOSIS — E0500 Thyrotoxicosis with diffuse goiter without thyrotoxic crisis or storm: Secondary | ICD-10-CM | POA: Diagnosis not present

## 2021-09-14 DIAGNOSIS — R252 Cramp and spasm: Secondary | ICD-10-CM | POA: Diagnosis not present

## 2021-09-14 DIAGNOSIS — Z720 Tobacco use: Secondary | ICD-10-CM | POA: Diagnosis not present

## 2021-09-14 DIAGNOSIS — G4700 Insomnia, unspecified: Secondary | ICD-10-CM | POA: Diagnosis not present

## 2021-09-25 DIAGNOSIS — H47233 Glaucomatous optic atrophy, bilateral: Secondary | ICD-10-CM | POA: Diagnosis not present

## 2021-09-25 DIAGNOSIS — H5330 Unspecified disorder of binocular vision: Secondary | ICD-10-CM | POA: Diagnosis not present

## 2021-09-25 DIAGNOSIS — H52223 Regular astigmatism, bilateral: Secondary | ICD-10-CM | POA: Diagnosis not present

## 2021-09-25 DIAGNOSIS — H5203 Hypermetropia, bilateral: Secondary | ICD-10-CM | POA: Diagnosis not present

## 2021-09-25 DIAGNOSIS — Z0100 Encounter for examination of eyes and vision without abnormal findings: Secondary | ICD-10-CM | POA: Diagnosis not present

## 2021-09-25 DIAGNOSIS — Z961 Presence of intraocular lens: Secondary | ICD-10-CM | POA: Diagnosis not present

## 2021-10-21 DIAGNOSIS — M5410 Radiculopathy, site unspecified: Secondary | ICD-10-CM | POA: Diagnosis not present

## 2021-10-21 DIAGNOSIS — M5459 Other low back pain: Secondary | ICD-10-CM | POA: Diagnosis not present

## 2021-10-21 DIAGNOSIS — M25552 Pain in left hip: Secondary | ICD-10-CM | POA: Diagnosis not present

## 2021-10-21 DIAGNOSIS — Z96642 Presence of left artificial hip joint: Secondary | ICD-10-CM | POA: Diagnosis not present

## 2021-11-12 ENCOUNTER — Telehealth: Payer: Self-pay | Admitting: Surgery

## 2021-11-12 NOTE — Telephone Encounter (Signed)
I called patient. She sees Dr. Alvan Dame and had dialed the wrong number.

## 2021-11-12 NOTE — Telephone Encounter (Signed)
Patient called. She would like some pain medication called in for her. Her call back number is [phone]

## 2021-11-18 DIAGNOSIS — M5416 Radiculopathy, lumbar region: Secondary | ICD-10-CM | POA: Diagnosis not present

## 2021-11-25 DIAGNOSIS — M5136 Other intervertebral disc degeneration, lumbar region: Secondary | ICD-10-CM | POA: Diagnosis not present

## 2021-11-25 DIAGNOSIS — M48062 Spinal stenosis, lumbar region with neurogenic claudication: Secondary | ICD-10-CM | POA: Diagnosis not present

## 2021-12-07 ENCOUNTER — Encounter (INDEPENDENT_AMBULATORY_CARE_PROVIDER_SITE_OTHER): Payer: Self-pay | Admitting: *Deleted

## 2021-12-08 ENCOUNTER — Other Ambulatory Visit: Payer: Medicare HMO

## 2021-12-09 ENCOUNTER — Other Ambulatory Visit: Payer: Self-pay | Admitting: Family Medicine

## 2021-12-09 DIAGNOSIS — Z122 Encounter for screening for malignant neoplasm of respiratory organs: Secondary | ICD-10-CM

## 2021-12-15 DIAGNOSIS — K2970 Gastritis, unspecified, without bleeding: Secondary | ICD-10-CM | POA: Diagnosis not present

## 2021-12-15 DIAGNOSIS — Z720 Tobacco use: Secondary | ICD-10-CM | POA: Diagnosis not present

## 2021-12-15 DIAGNOSIS — R69 Illness, unspecified: Secondary | ICD-10-CM | POA: Diagnosis not present

## 2021-12-15 DIAGNOSIS — G4700 Insomnia, unspecified: Secondary | ICD-10-CM | POA: Diagnosis not present

## 2021-12-15 DIAGNOSIS — Z1159 Encounter for screening for other viral diseases: Secondary | ICD-10-CM | POA: Diagnosis not present

## 2022-01-12 ENCOUNTER — Ambulatory Visit
Admission: RE | Admit: 2022-01-12 | Discharge: 2022-01-12 | Disposition: A | Discharge status: Home / Self Care | Payer: Medicare HMO | Source: Ambulatory Visit | Attending: Family Medicine | Admitting: Family Medicine

## 2022-01-12 DIAGNOSIS — F17210 Nicotine dependence, cigarettes, uncomplicated: Secondary | ICD-10-CM | POA: Diagnosis not present

## 2022-01-12 DIAGNOSIS — Z122 Encounter for screening for malignant neoplasm of respiratory organs: Secondary | ICD-10-CM

## 2022-01-12 DIAGNOSIS — R69 Illness, unspecified: Secondary | ICD-10-CM | POA: Diagnosis not present

## 2022-03-17 ENCOUNTER — Other Ambulatory Visit: Payer: Self-pay | Admitting: Family Medicine

## 2022-03-17 ENCOUNTER — Ambulatory Visit
Admission: RE | Admit: 2022-03-17 | Discharge: 2022-03-17 | Disposition: A | Discharge status: Home / Self Care | Payer: Medicare HMO | Source: Ambulatory Visit | Attending: Family Medicine | Admitting: Family Medicine

## 2022-03-17 DIAGNOSIS — M79605 Pain in left leg: Secondary | ICD-10-CM

## 2022-03-17 DIAGNOSIS — I10 Essential (primary) hypertension: Secondary | ICD-10-CM | POA: Diagnosis not present

## 2022-03-23 ENCOUNTER — Other Ambulatory Visit: Payer: Medicare HMO

## 2022-05-03 DIAGNOSIS — M47816 Spondylosis without myelopathy or radiculopathy, lumbar region: Secondary | ICD-10-CM | POA: Diagnosis not present

## 2022-05-13 DIAGNOSIS — R600 Localized edema: Secondary | ICD-10-CM | POA: Diagnosis not present

## 2022-05-13 DIAGNOSIS — I700 Atherosclerosis of aorta: Secondary | ICD-10-CM | POA: Diagnosis not present

## 2022-05-13 DIAGNOSIS — R229 Localized swelling, mass and lump, unspecified: Secondary | ICD-10-CM | POA: Diagnosis not present

## 2022-05-13 DIAGNOSIS — R7303 Prediabetes: Secondary | ICD-10-CM | POA: Diagnosis not present

## 2022-05-13 DIAGNOSIS — J439 Emphysema, unspecified: Secondary | ICD-10-CM | POA: Diagnosis not present

## 2022-05-13 DIAGNOSIS — R69 Illness, unspecified: Secondary | ICD-10-CM | POA: Diagnosis not present

## 2022-05-13 DIAGNOSIS — E0500 Thyrotoxicosis with diffuse goiter without thyrotoxic crisis or storm: Secondary | ICD-10-CM | POA: Diagnosis not present

## 2022-05-13 DIAGNOSIS — E039 Hypothyroidism, unspecified: Secondary | ICD-10-CM | POA: Diagnosis not present

## 2022-05-14 ENCOUNTER — Other Ambulatory Visit: Payer: Self-pay | Admitting: Family Medicine

## 2022-05-14 DIAGNOSIS — R229 Localized swelling, mass and lump, unspecified: Secondary | ICD-10-CM

## 2022-05-21 DIAGNOSIS — M47896 Other spondylosis, lumbar region: Secondary | ICD-10-CM | POA: Diagnosis not present

## 2022-05-26 ENCOUNTER — Other Ambulatory Visit: Payer: Self-pay | Admitting: Family Medicine

## 2022-05-26 DIAGNOSIS — Z1231 Encounter for screening mammogram for malignant neoplasm of breast: Secondary | ICD-10-CM

## 2022-05-31 DIAGNOSIS — M47896 Other spondylosis, lumbar region: Secondary | ICD-10-CM | POA: Diagnosis not present

## 2022-06-03 ENCOUNTER — Ambulatory Visit
Admission: RE | Admit: 2022-06-03 | Discharge: 2022-06-03 | Disposition: A | Discharge status: Home / Self Care | Payer: Medicare HMO | Source: Ambulatory Visit | Attending: Family Medicine | Admitting: Family Medicine

## 2022-06-03 DIAGNOSIS — M23322 Other meniscus derangements, posterior horn of medial meniscus, left knee: Secondary | ICD-10-CM | POA: Diagnosis not present

## 2022-06-03 DIAGNOSIS — R229 Localized swelling, mass and lump, unspecified: Secondary | ICD-10-CM

## 2022-06-03 MED ORDER — GADOPICLENOL 0.5 MMOL/ML IV SOLN
10.0000 mL | Freq: Once | INTRAVENOUS | Status: AC | PRN
  Administered 2022-06-03: 10 mL via INTRAVENOUS

## 2022-06-04 DIAGNOSIS — E876 Hypokalemia: Secondary | ICD-10-CM | POA: Diagnosis not present

## 2022-06-04 DIAGNOSIS — M8588 Other specified disorders of bone density and structure, other site: Secondary | ICD-10-CM | POA: Diagnosis not present

## 2022-06-04 DIAGNOSIS — E0500 Thyrotoxicosis with diffuse goiter without thyrotoxic crisis or storm: Secondary | ICD-10-CM | POA: Diagnosis not present

## 2022-06-04 DIAGNOSIS — R69 Illness, unspecified: Secondary | ICD-10-CM | POA: Diagnosis not present

## 2022-06-04 DIAGNOSIS — Z0000 Encounter for general adult medical examination without abnormal findings: Secondary | ICD-10-CM | POA: Diagnosis not present

## 2022-06-04 DIAGNOSIS — I700 Atherosclerosis of aorta: Secondary | ICD-10-CM | POA: Diagnosis not present

## 2022-06-04 DIAGNOSIS — J439 Emphysema, unspecified: Secondary | ICD-10-CM | POA: Diagnosis not present

## 2022-06-04 DIAGNOSIS — E785 Hyperlipidemia, unspecified: Secondary | ICD-10-CM | POA: Diagnosis not present

## 2022-06-04 DIAGNOSIS — R7303 Prediabetes: Secondary | ICD-10-CM | POA: Diagnosis not present

## 2022-06-04 DIAGNOSIS — E039 Hypothyroidism, unspecified: Secondary | ICD-10-CM | POA: Diagnosis not present

## 2022-06-04 DIAGNOSIS — I10 Essential (primary) hypertension: Secondary | ICD-10-CM | POA: Diagnosis not present

## 2022-06-07 ENCOUNTER — Other Ambulatory Visit: Payer: Self-pay | Admitting: Family Medicine

## 2022-06-07 DIAGNOSIS — M8580 Other specified disorders of bone density and structure, unspecified site: Secondary | ICD-10-CM

## 2022-06-17 DIAGNOSIS — M47816 Spondylosis without myelopathy or radiculopathy, lumbar region: Secondary | ICD-10-CM | POA: Diagnosis not present

## 2022-06-28 DIAGNOSIS — E039 Hypothyroidism, unspecified: Secondary | ICD-10-CM | POA: Diagnosis not present

## 2022-06-28 DIAGNOSIS — E876 Hypokalemia: Secondary | ICD-10-CM | POA: Diagnosis not present

## 2022-06-30 DIAGNOSIS — D492 Neoplasm of unspecified behavior of bone, soft tissue, and skin: Secondary | ICD-10-CM | POA: Diagnosis not present

## 2022-06-30 DIAGNOSIS — Z6829 Body mass index (BMI) 29.0-29.9, adult: Secondary | ICD-10-CM | POA: Diagnosis not present

## 2022-07-06 ENCOUNTER — Encounter (INDEPENDENT_AMBULATORY_CARE_PROVIDER_SITE_OTHER): Payer: Self-pay | Admitting: *Deleted

## 2022-07-06 ENCOUNTER — Ambulatory Visit
Admission: RE | Admit: 2022-07-06 | Discharge: 2022-07-06 | Disposition: A | Discharge status: Home / Self Care | Payer: Medicare HMO | Source: Ambulatory Visit | Attending: Family Medicine | Admitting: Family Medicine

## 2022-07-06 DIAGNOSIS — M8580 Other specified disorders of bone density and structure, unspecified site: Secondary | ICD-10-CM

## 2022-07-06 DIAGNOSIS — Z780 Asymptomatic menopausal state: Secondary | ICD-10-CM | POA: Diagnosis not present

## 2022-07-06 DIAGNOSIS — M8589 Other specified disorders of bone density and structure, multiple sites: Secondary | ICD-10-CM | POA: Diagnosis not present

## 2022-07-08 DIAGNOSIS — M47816 Spondylosis without myelopathy or radiculopathy, lumbar region: Secondary | ICD-10-CM | POA: Diagnosis not present

## 2022-07-12 ENCOUNTER — Ambulatory Visit
Admission: RE | Admit: 2022-07-12 | Discharge: 2022-07-12 | Disposition: A | Discharge status: Home / Self Care | Payer: Medicare HMO | Source: Ambulatory Visit | Attending: Family Medicine | Admitting: Family Medicine

## 2022-07-12 DIAGNOSIS — Z1231 Encounter for screening mammogram for malignant neoplasm of breast: Secondary | ICD-10-CM

## 2022-07-12 HISTORY — DX: Personal history of irradiation: Z92.3

## 2022-07-13 ENCOUNTER — Other Ambulatory Visit: Payer: Self-pay | Admitting: *Deleted

## 2022-07-13 DIAGNOSIS — I8312 Varicose veins of left lower extremity with inflammation: Secondary | ICD-10-CM

## 2022-07-20 ENCOUNTER — Ambulatory Visit: Payer: Medicare HMO

## 2022-07-20 ENCOUNTER — Ambulatory Visit (HOSPITAL_COMMUNITY): Payer: Medicare HMO

## 2022-07-22 DIAGNOSIS — Z79899 Other long term (current) drug therapy: Secondary | ICD-10-CM | POA: Diagnosis not present

## 2022-07-22 DIAGNOSIS — M5136 Other intervertebral disc degeneration, lumbar region: Secondary | ICD-10-CM | POA: Diagnosis not present

## 2022-07-22 DIAGNOSIS — Z5181 Encounter for therapeutic drug level monitoring: Secondary | ICD-10-CM | POA: Diagnosis not present

## 2022-08-06 ENCOUNTER — Telehealth (INDEPENDENT_AMBULATORY_CARE_PROVIDER_SITE_OTHER): Payer: Self-pay | Admitting: Gastroenterology

## 2022-08-06 DIAGNOSIS — Z1211 Encounter for screening for malignant neoplasm of colon: Secondary | ICD-10-CM

## 2022-08-06 NOTE — Telephone Encounter (Signed)
Any room Thanks 

## 2022-08-06 NOTE — Telephone Encounter (Signed)
Who is your primary care physician: Dr.Eli Hammer  Reasons for the colonoscopy: screening  Have you had a colonoscopy before?  Yes; 2018  Do you have family history of colon cancer? no  Previous colonoscopy with polyps removed?   Do you have a history colorectal cancer?   no  Are you diabetic? If yes, Type 1 or Type 2?    no  Do you have a prosthetic or mechanical heart valve? no  Do you have a pacemaker/defibrillator?   no  Have you had endocarditis/atrial fibrillation? no  Have you had joint replacement within the last 12 months?    Do you tend to be constipated or have to use laxatives? no  Do you have any history of drugs or alchohol?  no  Do you use supplemental oxygen?  no  Have you had a stroke or heart attack within the last 6 months? no  Do you take weight loss medication?  no  For female patients: have you had a hysterectomy?  yes                                     are you post menopausal?                                                   do you still have your menstrual cycle? no      Do you take any blood-thinning medications such as: (aspirin, warfarin, Plavix, Aggrenox)  no  If yes we need the name, milligram, dosage and who is prescribing doctor  Current Outpatient Medications on File Prior to Visit  Medication Sig Dispense Refill   acetaminophen (TYLENOL) 500 MG tablet Take 500 mg by mouth every 6 (six) hours as needed.     albuterol (VENTOLIN HFA) 108 (90 Base) MCG/ACT inhaler Inhale 2 puffs into the lungs every 6 (six) hours as needed for wheezing or shortness of breath. 1 each 1   amLODipine (NORVASC) 5 MG tablet Take 1 tablet (5 mg total) by mouth daily. 90 tablet 1   atorvastatin (LIPITOR) 80 MG tablet Take 1 tablet (80 mg total) by mouth daily. 90 tablet 1   furosemide (LASIX) 20 MG tablet TAKE 1 TABLET BY MOUTH ONCE DAILY AS NEEDED 90 tablet 0   levothyroxine (SYNTHROID) 50 MCG tablet Take 1 tablet (50 mcg total) by mouth daily. 90 tablet 1    omeprazole (PRILOSEC OTC) 20 MG tablet Take 20 mg by mouth daily.     zolpidem (AMBIEN) 10 MG tablet Take 1 tablet (10 mg total) by mouth at bedtime as needed for sleep. for sleep 30 tablet 2   No current facility-administered medications on file prior to visit.    Allergies  Allergen Reactions   Ibuprofen Hives   Naproxen Sodium Hives   Aspirin Hives   Latex Rash   Vicodin [Hydrocodone-Acetaminophen] Hives   Hydrocodeine [Dihydrocodeine] Rash     Pharmacy: [facility]  Primary Insurance Name: [employer]  Best number where you can be reached: [phone]

## 2022-08-10 NOTE — Telephone Encounter (Signed)
Pt returned call. Pt has scheduled husband Iantha Fallen) for June. They would like separate dates. Will need to call pt back when I get July schedule.

## 2022-08-10 NOTE — Telephone Encounter (Signed)
Left message to return call 

## 2022-08-16 MED ORDER — PEG 3350-KCL-NA BICARB-NACL 420 G PO SOLR: 4000.0000 mL | Freq: Once | ORAL | 0 refills | Status: AC

## 2022-08-16 NOTE — Addendum Note (Signed)
Addended by: Marlowe Shores on: 08/16/2022 12:56 PM   Modules accepted: Orders

## 2022-08-16 NOTE — Telephone Encounter (Signed)
Pt returned call and is scheduled for 09/29/22 at 8:15 am. Instructions will be mailed to patient. Prep sent to pharmacy. Lab order also in.

## 2022-08-16 NOTE — Telephone Encounter (Signed)
Left message to return call 

## 2022-09-07 DIAGNOSIS — I10 Essential (primary) hypertension: Secondary | ICD-10-CM | POA: Diagnosis not present

## 2022-09-07 DIAGNOSIS — R7303 Prediabetes: Secondary | ICD-10-CM | POA: Diagnosis not present

## 2022-09-07 DIAGNOSIS — E890 Postprocedural hypothyroidism: Secondary | ICD-10-CM | POA: Diagnosis not present

## 2022-09-07 DIAGNOSIS — E0500 Thyrotoxicosis with diffuse goiter without thyrotoxic crisis or storm: Secondary | ICD-10-CM | POA: Diagnosis not present

## 2022-09-07 DIAGNOSIS — Z8639 Personal history of other endocrine, nutritional and metabolic disease: Secondary | ICD-10-CM | POA: Diagnosis not present

## 2022-09-07 DIAGNOSIS — F17200 Nicotine dependence, unspecified, uncomplicated: Secondary | ICD-10-CM | POA: Diagnosis not present

## 2022-09-17 DIAGNOSIS — M47896 Other spondylosis, lumbar region: Secondary | ICD-10-CM | POA: Diagnosis not present

## 2022-09-17 DIAGNOSIS — M47816 Spondylosis without myelopathy or radiculopathy, lumbar region: Secondary | ICD-10-CM | POA: Diagnosis not present

## 2022-09-27 ENCOUNTER — Telehealth (INDEPENDENT_AMBULATORY_CARE_PROVIDER_SITE_OTHER): Payer: Self-pay | Admitting: Gastroenterology

## 2022-09-27 NOTE — Telephone Encounter (Signed)
Pt left message in regards to losing instructions for TCS on Wednesday.  Returned call to patient and went through instructions with her. Pt verbalized understanding.

## 2022-09-28 ENCOUNTER — Telehealth (INDEPENDENT_AMBULATORY_CARE_PROVIDER_SITE_OTHER): Payer: Self-pay | Admitting: Gastroenterology

## 2022-09-28 NOTE — OR Nursing (Signed)
Patient states she does not have transportation due to car trouble for her procedure tomorrow

## 2022-09-28 NOTE — Telephone Encounter (Signed)
Pt left message needing to cancel colonoscopy (ASA any) for 09/29/22 due to care trouble. Contacted pt and informed her it would be August before I can reschedule her, pt verbalized understanding. Will place not in August folder to get pt rescheduled.

## 2022-09-29 ENCOUNTER — Encounter (INDEPENDENT_AMBULATORY_CARE_PROVIDER_SITE_OTHER): Payer: Self-pay | Admitting: *Deleted

## 2022-09-29 LAB — HM COLONOSCOPY

## 2022-10-01 DIAGNOSIS — L03116 Cellulitis of left lower limb: Secondary | ICD-10-CM | POA: Diagnosis not present

## 2022-10-05 ENCOUNTER — Encounter (INDEPENDENT_AMBULATORY_CARE_PROVIDER_SITE_OTHER): Payer: Self-pay

## 2022-10-27 ENCOUNTER — Ambulatory Visit (HOSPITAL_COMMUNITY): Payer: Medicare HMO | Admitting: Anesthesiology

## 2022-10-27 ENCOUNTER — Other Ambulatory Visit: Payer: Self-pay

## 2022-10-27 ENCOUNTER — Encounter (HOSPITAL_COMMUNITY)
Admission: RE | Disposition: A | Discharge status: Home / Self Care | Payer: Self-pay | Source: Home / Self Care | Attending: Gastroenterology

## 2022-10-27 ENCOUNTER — Ambulatory Visit (HOSPITAL_BASED_OUTPATIENT_CLINIC_OR_DEPARTMENT_OTHER): Payer: Medicare HMO | Admitting: Anesthesiology

## 2022-10-27 ENCOUNTER — Ambulatory Visit (HOSPITAL_COMMUNITY)
Admission: RE | Admit: 2022-10-27 | Discharge: 2022-10-27 | Disposition: A | Discharge status: Home / Self Care | Payer: Medicare HMO | Attending: Gastroenterology | Admitting: Gastroenterology

## 2022-10-27 ENCOUNTER — Encounter (HOSPITAL_COMMUNITY): Payer: Self-pay | Admitting: Gastroenterology

## 2022-10-27 DIAGNOSIS — D126 Benign neoplasm of colon, unspecified: Secondary | ICD-10-CM | POA: Diagnosis not present

## 2022-10-27 DIAGNOSIS — Z1211 Encounter for screening for malignant neoplasm of colon: Secondary | ICD-10-CM

## 2022-10-27 DIAGNOSIS — F419 Anxiety disorder, unspecified: Secondary | ICD-10-CM | POA: Insufficient documentation

## 2022-10-27 DIAGNOSIS — F32A Depression, unspecified: Secondary | ICD-10-CM | POA: Insufficient documentation

## 2022-10-27 DIAGNOSIS — Z09 Encounter for follow-up examination after completed treatment for conditions other than malignant neoplasm: Secondary | ICD-10-CM | POA: Insufficient documentation

## 2022-10-27 DIAGNOSIS — K635 Polyp of colon: Secondary | ICD-10-CM | POA: Diagnosis not present

## 2022-10-27 DIAGNOSIS — K648 Other hemorrhoids: Secondary | ICD-10-CM

## 2022-10-27 DIAGNOSIS — E039 Hypothyroidism, unspecified: Secondary | ICD-10-CM | POA: Diagnosis not present

## 2022-10-27 DIAGNOSIS — F17210 Nicotine dependence, cigarettes, uncomplicated: Secondary | ICD-10-CM | POA: Diagnosis not present

## 2022-10-27 DIAGNOSIS — K5730 Diverticulosis of large intestine without perforation or abscess without bleeding: Secondary | ICD-10-CM | POA: Insufficient documentation

## 2022-10-27 DIAGNOSIS — F17200 Nicotine dependence, unspecified, uncomplicated: Secondary | ICD-10-CM | POA: Diagnosis not present

## 2022-10-27 DIAGNOSIS — Z853 Personal history of malignant neoplasm of breast: Secondary | ICD-10-CM | POA: Insufficient documentation

## 2022-10-27 DIAGNOSIS — E0500 Thyrotoxicosis with diffuse goiter without thyrotoxic crisis or storm: Secondary | ICD-10-CM | POA: Diagnosis not present

## 2022-10-27 DIAGNOSIS — Z139 Encounter for screening, unspecified: Secondary | ICD-10-CM | POA: Diagnosis not present

## 2022-10-27 DIAGNOSIS — I10 Essential (primary) hypertension: Secondary | ICD-10-CM | POA: Diagnosis not present

## 2022-10-27 DIAGNOSIS — D124 Benign neoplasm of descending colon: Secondary | ICD-10-CM | POA: Diagnosis not present

## 2022-10-27 DIAGNOSIS — D123 Benign neoplasm of transverse colon: Secondary | ICD-10-CM | POA: Insufficient documentation

## 2022-10-27 DIAGNOSIS — Z8541 Personal history of malignant neoplasm of cervix uteri: Secondary | ICD-10-CM | POA: Diagnosis not present

## 2022-10-27 DIAGNOSIS — Z79899 Other long term (current) drug therapy: Secondary | ICD-10-CM | POA: Insufficient documentation

## 2022-10-27 DIAGNOSIS — J449 Chronic obstructive pulmonary disease, unspecified: Secondary | ICD-10-CM | POA: Diagnosis not present

## 2022-10-27 DIAGNOSIS — Z86010 Personal history of colonic polyps: Secondary | ICD-10-CM | POA: Insufficient documentation

## 2022-10-27 DIAGNOSIS — D122 Benign neoplasm of ascending colon: Secondary | ICD-10-CM | POA: Diagnosis not present

## 2022-10-27 HISTORY — PX: COLONOSCOPY WITH PROPOFOL: SHX5780

## 2022-10-27 HISTORY — PX: POLYPECTOMY: SHX149

## 2022-10-27 LAB — HM COLONOSCOPY

## 2022-10-27 SURGERY — COLONOSCOPY WITH PROPOFOL
Anesthesia: General

## 2022-10-27 MED ORDER — PROPOFOL 10 MG/ML IV BOLUS
INTRAVENOUS | Status: DC | PRN
Start: 2022-10-27 — End: 2022-10-27
  Administered 2022-10-27: 50 mg via INTRAVENOUS
  Administered 2022-10-27: 100 mg via INTRAVENOUS
  Administered 2022-10-27: 50 mg via INTRAVENOUS

## 2022-10-27 MED ORDER — PROPOFOL 500 MG/50ML IV EMUL
INTRAVENOUS | Status: DC | PRN
  Administered 2022-10-27: 150 ug/kg/min via INTRAVENOUS

## 2022-10-27 MED ORDER — LACTATED RINGERS IV SOLN
INTRAVENOUS | Status: DC
  Administered 2022-10-27: 1000 mL via INTRAVENOUS

## 2022-10-27 MED ORDER — LIDOCAINE HCL (CARDIAC) PF 100 MG/5ML IV SOSY
PREFILLED_SYRINGE | INTRAVENOUS | Status: DC | PRN
  Administered 2022-10-27: 50 mg via INTRAVENOUS

## 2022-10-27 NOTE — Transfer of Care (Signed)
Immediate Anesthesia Transfer of Care Note  Patient: [name] [name]  Procedure(s) Performed: COLONOSCOPY WITH PROPOFOL POLYPECTOMY INTESTINAL  Patient Location: Endoscopy Unit  Anesthesia Type:General  Level of Consciousness: awake  Airway & Oxygen Therapy: Patient Spontanous Breathing  Post-op Assessment: Report given to RN and Post -op Vital signs reviewed and stable  Post vital signs: Reviewed and stable  Last Vitals:  Vitals Value Taken Time  BP 162/53 10/27/22 1300  Temp 36.8 C 10/27/22 1300  Pulse 97 10/27/22 1300  Resp 19 10/27/22 1300  SpO2 98 % 10/27/22 1300    Last Pain:  Vitals:   10/27/22 1300  TempSrc: Oral  PainSc: 0-No pain      Patients Stated Pain Goal: 8 (10/27/22 1149)  Complications: No notable events documented.

## 2022-10-27 NOTE — Anesthesia Preprocedure Evaluation (Addendum)
Anesthesia Evaluation  Patient identified by MRN, date of birth, ID band Patient awake    Reviewed: Allergy & Precautions, H&P , NPO status , Patient's Chart, lab work & pertinent test results  Airway Mallampati: II  TM Distance: >3 FB Neck ROM: Full    Dental  (+) Edentulous Upper, Edentulous Lower   Pulmonary COPD, Current Smoker   Pulmonary exam normal breath sounds clear to auscultation       Cardiovascular hypertension, Pt. on medications Normal cardiovascular exam Rhythm:Regular Rate:Normal     Neuro/Psych  PSYCHIATRIC DISORDERS Anxiety Depression     Neuromuscular disease    GI/Hepatic Neg liver ROS,GERD  Medicated,,  Endo/Other  Hypothyroidism    Renal/GU negative Renal ROS  negative genitourinary   Musculoskeletal  (+) Arthritis , Osteoarthritis,    Abdominal   Peds negative pediatric ROS (+)  Hematology negative hematology ROS (+)   Anesthesia Other Findings patient denied H/O SLE  Reproductive/Obstetrics negative OB ROS                             Anesthesia Physical Anesthesia Plan  ASA: 2  Anesthesia Plan: General   Post-op Pain Management: Minimal or no pain anticipated   Induction: Intravenous  PONV Risk Score and Plan: 1  Airway Management Planned: Nasal Cannula and Natural Airway  Additional Equipment:   Intra-op Plan:   Post-operative Plan:   Informed Consent: I have reviewed the patients History and Physical, chart, labs and discussed the procedure including the risks, benefits and alternatives for the proposed anesthesia with the patient or authorized representative who has indicated his/her understanding and acceptance.       Plan Discussed with: CRNA and Surgeon  Anesthesia Plan Comments:        Anesthesia Quick Evaluation

## 2022-10-27 NOTE — Op Note (Signed)
[facility] Patient Name: [name] [name] Procedure Date: 10/27/2022 12:12 PM MRN: [id number] Date of Birth: 1950-12-09 Attending MD: Katrinka Blazing , , [id number] CSN: [id number] Age: 72 Admit Type: Outpatient Procedure:                Colonoscopy Indications:              Surveillance: Personal history of adenomatous                            polyps on last colonoscopy > 5 years ago Providers:                Katrinka Blazing, Crystal Page, Lennice Sites                            Technician, Technician Referring MD:              Medicines:                Monitored Anesthesia Care Complications:            No immediate complications. Estimated Blood Loss:     Estimated blood loss: none. Procedure:                Pre-Anesthesia Assessment:                           - Prior to the procedure, a History and Physical                            was performed, and patient medications, allergies                            and sensitivities were reviewed. The patient's                            tolerance of previous anesthesia was reviewed.                           - The risks and benefits of the procedure and the                            sedation options and risks were discussed with the                            patient. All questions were answered and informed                            consent was obtained.                           - ASA Grade Assessment: III - A patient with severe                            systemic disease.                           After obtaining informed consent, the colonoscope  was passed under direct vision. Throughout the                            procedure, the patient's blood pressure, pulse, and                            oxygen saturations were monitored continuously. The                            [id number]-HQ190L ([id number]) scope was introduced through                            the anus and advanced to the the cecum,  identified                            by appendiceal orifice and ileocecal valve. The                            colonoscopy was performed without difficulty. The                            patient tolerated the procedure well. The quality                            of the bowel preparation was good. Scope In: 12:29:34 PM Scope Out: 12:57:18 PM Scope Withdrawal Time: 0 hours 22 minutes 24 seconds  Total Procedure Duration: 0 hours 27 minutes 44 seconds  Findings:      The perianal and digital rectal examinations were normal.      Five sessile polyps were found in the transverse colon and ascending       colon. The polyps were 4 to 8 mm in size. These polyps were removed with       a cold snare. Resection and retrieval were complete.      Two sessile polyps were found in the descending colon. The polyps were 3       to 5 mm in size. These polyps were removed with a cold snare. Resection       and retrieval were complete.      A few small-mouthed diverticula were found in the sigmoid colon.      Non-bleeding internal hemorrhoids were found during retroflexion. The       hemorrhoids were small. Impression:               - Five 4 to 8 mm polyps in the transverse colon and                            in the ascending colon, removed with a cold snare.                            Resected and retrieved.                           - Two 3 to 5 mm polyps in the descending colon,  removed with a cold snare. Resected and retrieved.                           - Diverticulosis in the sigmoid colon.                           - Non-bleeding internal hemorrhoids. Moderate Sedation:      Per Anesthesia Care Recommendation:           - Discharge patient to home (ambulatory).                           - Resume previous diet.                           - Await pathology results.                           - Repeat colonoscopy for surveillance based on                             pathology results. Procedure Code(s):        --- Professional ---                           [phone], Colonoscopy, flexible; with removal of                            tumor(s), polyp(s), or other lesion(s) by snare                            technique Diagnosis Code(s):        --- Professional ---                           Z86.010, Personal history of colonic polyps                           D12.3, Benign neoplasm of transverse colon (hepatic                            flexure or splenic flexure)                           D12.2, Benign neoplasm of ascending colon                           D12.4, Benign neoplasm of descending colon                           K64.8, Other hemorrhoids                           K57.30, Diverticulosis of large intestine without                            perforation or abscess without bleeding CPT copyright 2022 American Medical Association. All rights reserved.  The codes documented in this report are preliminary and upon coder review may  be revised to meet current compliance requirements. Katrinka Blazing, MD Katrinka Blazing,  10/27/2022 1:02:18 PM This report has been signed electronically. Number of Addenda: 0

## 2022-10-27 NOTE — Anesthesia Postprocedure Evaluation (Signed)
Anesthesia Post Note  Patient: [name] [name]  Procedure(s) Performed: COLONOSCOPY WITH PROPOFOL POLYPECTOMY INTESTINAL  Patient location during evaluation: Phase II Anesthesia Type: General Level of consciousness: awake and alert and oriented Pain management: pain level controlled Vital Signs Assessment: post-procedure vital signs reviewed and stable Respiratory status: spontaneous breathing, nonlabored ventilation and respiratory function stable Cardiovascular status: blood pressure returned to baseline and stable Postop Assessment: no apparent nausea or vomiting Anesthetic complications: no  No notable events documented.   Last Vitals:  Vitals:   10/27/22 1149 10/27/22 1300  BP: (!) 152/76 (!) 162/53  Pulse: 88 97  Resp: 19 19  Temp: 36.9 C 36.8 C  SpO2: 96% 98%    Last Pain:  Vitals:   10/27/22 1300  TempSrc: Oral  PainSc: 0-No pain                 [name] [name]

## 2022-10-27 NOTE — Anesthesia Procedure Notes (Signed)
Date/Time: 10/27/2022 12:28 PM  Performed by: Julian Reil, CRNAPre-anesthesia Checklist: Patient identified, Emergency Drugs available, Suction available and Patient being monitored Patient Re-evaluated:Patient Re-evaluated prior to induction Oxygen Delivery Method: Nasal cannula Induction Type: IV induction Placement Confirmation: positive ETCO2

## 2022-10-27 NOTE — H&P (Signed)
PAMELA LEWELLEN is an 72 y.o. female.   Chief Complaint: history of colon polyps HPI: 72 y/o F with PMH anxiety, cervical cancer, COPD, depression, hypothyroidism, Graves' disease, breast cancer, coming for history of colon polyps.  Last colonoscopy performed in 2018, found to have 3 tubular adenomas.  The patient denies having any complaints such as melena, hematochezia, abdominal pain or distention, change in her bowel movement consistency or frequency, no changes in weight recently.  No family history of colorectal cancer.   Past Medical History:  Diagnosis Date   Anxiety    Arthritis    Breast cancer (HCC)    DCIS 2008- right   Cancer [facility])    cervix   Chronic sinusitis    [facility]   COPD (chronic obstructive pulmonary disease) (HCC)    PFT 2011   Depression    Hypothyroidism    Keratoconjunctivitis    Right eye   Personal history of radiation therapy    Primary hypertension 03/19/2021   Thyroid disease    Grave disease    Past Surgical History:  Procedure Laterality Date   ABDOMINAL HYSTERECTOMY     cervical cancer   BREAST LUMPECTOMY Right 11/25/2006   malignant   BREAST SURGERY     COLONOSCOPY N/A 12/15/2016   Procedure: COLONOSCOPY;  Surgeon: Malissa Hippo, MD;  Location: [facility] ENDO [facility];  Service: Endoscopy;  Laterality: N/A;  930   ECTOPIC PREGNANCY SURGERY     POLYPECTOMY  12/15/2016   Procedure: POLYPECTOMY;  Surgeon: Malissa Hippo, MD;  Location: [facility] ENDO SUITE;  Service: Endoscopy;;  ascending, cecal, recto-sigmoid   TOTAL HIP ARTHROPLASTY Left     Family History  Problem Relation Age of Onset   Cancer Mother        lung   Cancer Sister        ?lymphoma   Diabetes Maternal Grandmother    Dementia Father    Colon cancer Neg Hx    Social History:  reports that she has been smoking cigarettes. She has a 60 pack-year smoking history. She has never used smokeless tobacco. She reports current alcohol use. She reports that she does not use  drugs.  Allergies:  Allergies  Allergen Reactions   Ibuprofen Hives   Naproxen Sodium Hives   Aspirin Hives   Latex Rash    Medications Prior to Admission  Medication Sig Dispense Refill   acetaminophen (TYLENOL) 650 MG CR tablet Take 1,300 mg by mouth every 8 (eight) hours as needed for pain.     amLODipine (NORVASC) 5 MG tablet Take 1 tablet (5 mg total) by mouth daily. 90 tablet 1   atorvastatin (LIPITOR) 80 MG tablet Take 1 tablet (80 mg total) by mouth daily. 90 tablet 1   bismuth subsalicylate (PEPTO BISMOL) 262 MG/15ML suspension Take 30 mLs by mouth every 6 (six) hours as needed for indigestion.     levothyroxine (SYNTHROID) 75 MCG tablet Take 75 mcg by mouth daily before breakfast.     Potassium 99 MG TABS Take 99 mg by mouth daily.     triamcinolone ointment (KENALOG) 0.1 % Apply 1 Application topically 2 (two) times daily as needed (itching).     zolpidem (AMBIEN) 5 MG tablet Take 5 mg by mouth at bedtime as needed for sleep.      No results found for this or any previous visit (from the past 48 hour(s)). No results found.  Review of Systems  All other systems reviewed and are  negative.   There were no vitals taken for this visit. Physical Exam  GENERAL: The patient is AO x3, in no acute distress. HEENT: Head is normocephalic and atraumatic. EOMI are intact. Mouth is well hydrated and without lesions. NECK: Supple. No masses LUNGS: Clear to auscultation. No presence of rhonchi/wheezing/rales. Adequate chest expansion HEART: RRR, normal s1 and s2. ABDOMEN: Soft, nontender, no guarding, no peritoneal signs, and nondistended. BS +. No masses. EXTREMITIES: Without any cyanosis, clubbing, rash, lesions or edema. NEUROLOGIC: AOx3, no focal motor deficit. SKIN: no jaundice, no rashes  Assessment/Plan 72 y/o F with PMH anxiety, cervical cancer, COPD, depression, hypothyroidism, Graves' disease, breast cancer, coming for history of colon polyps.  Will proceed with  colonoscopy.  Dolores Frame, MD 10/27/2022, 11:43 AM

## 2022-10-27 NOTE — Discharge Instructions (Addendum)
You are being discharged to home.  Resume your previous diet.  We are waiting for your pathology results.  Your physician has recommended a repeat colonoscopy for surveillance based on pathology results.  

## 2022-10-28 ENCOUNTER — Encounter (INDEPENDENT_AMBULATORY_CARE_PROVIDER_SITE_OTHER): Payer: Self-pay | Admitting: *Deleted

## 2022-10-28 LAB — SURGICAL PATHOLOGY

## 2022-10-29 ENCOUNTER — Encounter (HOSPITAL_COMMUNITY): Payer: Self-pay | Admitting: Gastroenterology

## 2022-11-01 ENCOUNTER — Encounter (INDEPENDENT_AMBULATORY_CARE_PROVIDER_SITE_OTHER): Payer: Self-pay | Admitting: *Deleted

## 2022-12-06 DIAGNOSIS — F411 Generalized anxiety disorder: Secondary | ICD-10-CM | POA: Diagnosis not present

## 2022-12-06 DIAGNOSIS — G4700 Insomnia, unspecified: Secondary | ICD-10-CM | POA: Diagnosis not present

## 2022-12-06 DIAGNOSIS — R7303 Prediabetes: Secondary | ICD-10-CM | POA: Diagnosis not present

## 2022-12-06 DIAGNOSIS — J439 Emphysema, unspecified: Secondary | ICD-10-CM | POA: Diagnosis not present

## 2022-12-06 DIAGNOSIS — I10 Essential (primary) hypertension: Secondary | ICD-10-CM | POA: Diagnosis not present

## 2022-12-06 DIAGNOSIS — K219 Gastro-esophageal reflux disease without esophagitis: Secondary | ICD-10-CM | POA: Diagnosis not present

## 2022-12-06 DIAGNOSIS — E890 Postprocedural hypothyroidism: Secondary | ICD-10-CM | POA: Diagnosis not present

## 2022-12-06 DIAGNOSIS — E039 Hypothyroidism, unspecified: Secondary | ICD-10-CM | POA: Diagnosis not present

## 2022-12-06 DIAGNOSIS — I700 Atherosclerosis of aorta: Secondary | ICD-10-CM | POA: Diagnosis not present

## 2022-12-06 DIAGNOSIS — F325 Major depressive disorder, single episode, in full remission: Secondary | ICD-10-CM | POA: Diagnosis not present

## 2022-12-06 DIAGNOSIS — E785 Hyperlipidemia, unspecified: Secondary | ICD-10-CM | POA: Diagnosis not present

## 2022-12-06 DIAGNOSIS — D492 Neoplasm of unspecified behavior of bone, soft tissue, and skin: Secondary | ICD-10-CM | POA: Diagnosis not present

## 2022-12-15 DIAGNOSIS — R309 Painful micturition, unspecified: Secondary | ICD-10-CM | POA: Diagnosis not present

## 2022-12-15 DIAGNOSIS — B029 Zoster without complications: Secondary | ICD-10-CM | POA: Diagnosis not present

## 2022-12-29 DIAGNOSIS — B0229 Other postherpetic nervous system involvement: Secondary | ICD-10-CM | POA: Diagnosis not present

## 2022-12-29 DIAGNOSIS — R32 Unspecified urinary incontinence: Secondary | ICD-10-CM | POA: Diagnosis not present

## 2023-01-14 ENCOUNTER — Other Ambulatory Visit: Payer: Self-pay | Admitting: Family Medicine

## 2023-01-14 DIAGNOSIS — F17200 Nicotine dependence, unspecified, uncomplicated: Secondary | ICD-10-CM

## 2023-01-14 DIAGNOSIS — Z122 Encounter for screening for malignant neoplasm of respiratory organs: Secondary | ICD-10-CM

## 2023-01-27 ENCOUNTER — Ambulatory Visit
Admission: RE | Admit: 2023-01-27 | Discharge: 2023-01-27 | Disposition: A | Discharge status: Home / Self Care | Payer: Medicare HMO | Source: Ambulatory Visit | Attending: Family Medicine | Admitting: Family Medicine

## 2023-01-27 DIAGNOSIS — F17200 Nicotine dependence, unspecified, uncomplicated: Secondary | ICD-10-CM

## 2023-01-27 DIAGNOSIS — Z122 Encounter for screening for malignant neoplasm of respiratory organs: Secondary | ICD-10-CM

## 2023-01-27 DIAGNOSIS — F17210 Nicotine dependence, cigarettes, uncomplicated: Secondary | ICD-10-CM | POA: Diagnosis not present

## 2023-03-22 DIAGNOSIS — M51369 Other intervertebral disc degeneration, lumbar region without mention of lumbar back pain or lower extremity pain: Secondary | ICD-10-CM | POA: Diagnosis not present

## 2023-03-22 DIAGNOSIS — M47816 Spondylosis without myelopathy or radiculopathy, lumbar region: Secondary | ICD-10-CM | POA: Diagnosis not present

## 2023-03-30 DIAGNOSIS — I288 Other diseases of pulmonary vessels: Secondary | ICD-10-CM | POA: Diagnosis not present

## 2023-03-30 DIAGNOSIS — I700 Atherosclerosis of aorta: Secondary | ICD-10-CM | POA: Diagnosis not present

## 2023-07-12 DIAGNOSIS — E039 Hypothyroidism, unspecified: Secondary | ICD-10-CM | POA: Diagnosis not present

## 2023-07-12 DIAGNOSIS — Z23 Encounter for immunization: Secondary | ICD-10-CM | POA: Diagnosis not present

## 2023-07-12 DIAGNOSIS — Z0000 Encounter for general adult medical examination without abnormal findings: Secondary | ICD-10-CM | POA: Diagnosis not present

## 2023-07-12 DIAGNOSIS — E785 Hyperlipidemia, unspecified: Secondary | ICD-10-CM | POA: Diagnosis not present

## 2023-07-12 DIAGNOSIS — I10 Essential (primary) hypertension: Secondary | ICD-10-CM | POA: Diagnosis not present

## 2023-07-12 DIAGNOSIS — B029 Zoster without complications: Secondary | ICD-10-CM | POA: Diagnosis not present

## 2023-07-12 DIAGNOSIS — J439 Emphysema, unspecified: Secondary | ICD-10-CM | POA: Diagnosis not present

## 2023-07-12 DIAGNOSIS — E890 Postprocedural hypothyroidism: Secondary | ICD-10-CM | POA: Diagnosis not present

## 2023-07-12 DIAGNOSIS — M8588 Other specified disorders of bone density and structure, other site: Secondary | ICD-10-CM | POA: Diagnosis not present

## 2023-07-12 DIAGNOSIS — R7303 Prediabetes: Secondary | ICD-10-CM | POA: Diagnosis not present

## 2023-07-12 DIAGNOSIS — F17200 Nicotine dependence, unspecified, uncomplicated: Secondary | ICD-10-CM | POA: Diagnosis not present

## 2023-07-12 DIAGNOSIS — G4700 Insomnia, unspecified: Secondary | ICD-10-CM | POA: Diagnosis not present

## 2023-08-25 ENCOUNTER — Other Ambulatory Visit: Payer: Self-pay | Admitting: Family Medicine

## 2023-08-25 DIAGNOSIS — Z1231 Encounter for screening mammogram for malignant neoplasm of breast: Secondary | ICD-10-CM

## 2023-08-31 ENCOUNTER — Ambulatory Visit
Admission: RE | Admit: 2023-08-31 | Discharge: 2023-08-31 | Disposition: A | Discharge status: Home / Self Care | Source: Ambulatory Visit | Attending: Family Medicine | Admitting: Family Medicine

## 2023-08-31 DIAGNOSIS — Z1231 Encounter for screening mammogram for malignant neoplasm of breast: Secondary | ICD-10-CM | POA: Diagnosis not present

## 2023-09-12 DIAGNOSIS — Z853 Personal history of malignant neoplasm of breast: Secondary | ICD-10-CM | POA: Diagnosis not present

## 2023-09-12 DIAGNOSIS — J439 Emphysema, unspecified: Secondary | ICD-10-CM | POA: Diagnosis not present

## 2023-09-12 DIAGNOSIS — E785 Hyperlipidemia, unspecified: Secondary | ICD-10-CM | POA: Diagnosis not present

## 2023-09-12 DIAGNOSIS — F322 Major depressive disorder, single episode, severe without psychotic features: Secondary | ICD-10-CM | POA: Diagnosis not present

## 2023-10-13 DIAGNOSIS — E785 Hyperlipidemia, unspecified: Secondary | ICD-10-CM | POA: Diagnosis not present

## 2023-10-13 DIAGNOSIS — Z853 Personal history of malignant neoplasm of breast: Secondary | ICD-10-CM | POA: Diagnosis not present

## 2023-10-13 DIAGNOSIS — F322 Major depressive disorder, single episode, severe without psychotic features: Secondary | ICD-10-CM | POA: Diagnosis not present

## 2023-10-13 DIAGNOSIS — J439 Emphysema, unspecified: Secondary | ICD-10-CM | POA: Diagnosis not present

## 2023-11-13 DIAGNOSIS — E785 Hyperlipidemia, unspecified: Secondary | ICD-10-CM | POA: Diagnosis not present

## 2023-11-13 DIAGNOSIS — Z853 Personal history of malignant neoplasm of breast: Secondary | ICD-10-CM | POA: Diagnosis not present

## 2023-11-13 DIAGNOSIS — J439 Emphysema, unspecified: Secondary | ICD-10-CM | POA: Diagnosis not present

## 2023-11-13 DIAGNOSIS — F322 Major depressive disorder, single episode, severe without psychotic features: Secondary | ICD-10-CM | POA: Diagnosis not present

## 2023-11-17 DIAGNOSIS — M5440 Lumbago with sciatica, unspecified side: Secondary | ICD-10-CM | POA: Diagnosis not present

## 2023-11-17 DIAGNOSIS — D492 Neoplasm of unspecified behavior of bone, soft tissue, and skin: Secondary | ICD-10-CM | POA: Diagnosis not present

## 2023-11-17 DIAGNOSIS — Z6830 Body mass index (BMI) 30.0-30.9, adult: Secondary | ICD-10-CM | POA: Diagnosis not present

## 2023-12-01 DIAGNOSIS — S83242A Other tear of medial meniscus, current injury, left knee, initial encounter: Secondary | ICD-10-CM | POA: Diagnosis not present

## 2023-12-01 DIAGNOSIS — R202 Paresthesia of skin: Secondary | ICD-10-CM | POA: Diagnosis not present

## 2023-12-01 DIAGNOSIS — M48061 Spinal stenosis, lumbar region without neurogenic claudication: Secondary | ICD-10-CM | POA: Diagnosis not present

## 2023-12-01 DIAGNOSIS — M51369 Other intervertebral disc degeneration, lumbar region without mention of lumbar back pain or lower extremity pain: Secondary | ICD-10-CM | POA: Diagnosis not present

## 2023-12-01 DIAGNOSIS — R222 Localized swelling, mass and lump, trunk: Secondary | ICD-10-CM | POA: Diagnosis not present

## 2023-12-01 DIAGNOSIS — M5126 Other intervertebral disc displacement, lumbar region: Secondary | ICD-10-CM | POA: Diagnosis not present

## 2023-12-01 DIAGNOSIS — M5440 Lumbago with sciatica, unspecified side: Secondary | ICD-10-CM | POA: Diagnosis not present

## 2023-12-01 DIAGNOSIS — D492 Neoplasm of unspecified behavior of bone, soft tissue, and skin: Secondary | ICD-10-CM | POA: Diagnosis not present

## 2023-12-01 DIAGNOSIS — M948X6 Other specified disorders of cartilage, lower leg: Secondary | ICD-10-CM | POA: Diagnosis not present

## 2023-12-13 DIAGNOSIS — J439 Emphysema, unspecified: Secondary | ICD-10-CM | POA: Diagnosis not present

## 2023-12-13 DIAGNOSIS — E785 Hyperlipidemia, unspecified: Secondary | ICD-10-CM | POA: Diagnosis not present

## 2023-12-13 DIAGNOSIS — F322 Major depressive disorder, single episode, severe without psychotic features: Secondary | ICD-10-CM | POA: Diagnosis not present

## 2023-12-13 DIAGNOSIS — Z853 Personal history of malignant neoplasm of breast: Secondary | ICD-10-CM | POA: Diagnosis not present

## 2023-12-20 DIAGNOSIS — Z6829 Body mass index (BMI) 29.0-29.9, adult: Secondary | ICD-10-CM | POA: Diagnosis not present

## 2023-12-20 DIAGNOSIS — M5440 Lumbago with sciatica, unspecified side: Secondary | ICD-10-CM | POA: Diagnosis not present

## 2023-12-28 ENCOUNTER — Encounter (INDEPENDENT_AMBULATORY_CARE_PROVIDER_SITE_OTHER): Payer: Self-pay | Admitting: Gastroenterology

## 2024-01-02 DIAGNOSIS — M5416 Radiculopathy, lumbar region: Secondary | ICD-10-CM | POA: Diagnosis not present

## 2024-01-11 DIAGNOSIS — I10 Essential (primary) hypertension: Secondary | ICD-10-CM | POA: Diagnosis not present

## 2024-01-11 DIAGNOSIS — R7303 Prediabetes: Secondary | ICD-10-CM | POA: Diagnosis not present

## 2024-01-11 DIAGNOSIS — M8588 Other specified disorders of bone density and structure, other site: Secondary | ICD-10-CM | POA: Diagnosis not present

## 2024-01-11 DIAGNOSIS — Z23 Encounter for immunization: Secondary | ICD-10-CM | POA: Diagnosis not present

## 2024-01-11 DIAGNOSIS — B029 Zoster without complications: Secondary | ICD-10-CM | POA: Diagnosis not present

## 2024-01-11 DIAGNOSIS — E785 Hyperlipidemia, unspecified: Secondary | ICD-10-CM | POA: Diagnosis not present

## 2024-01-11 DIAGNOSIS — L989 Disorder of the skin and subcutaneous tissue, unspecified: Secondary | ICD-10-CM | POA: Diagnosis not present

## 2024-01-11 DIAGNOSIS — E039 Hypothyroidism, unspecified: Secondary | ICD-10-CM | POA: Diagnosis not present

## 2024-01-11 DIAGNOSIS — J439 Emphysema, unspecified: Secondary | ICD-10-CM | POA: Diagnosis not present

## 2024-01-11 DIAGNOSIS — I288 Other diseases of pulmonary vessels: Secondary | ICD-10-CM | POA: Diagnosis not present

## 2024-01-11 DIAGNOSIS — I2510 Atherosclerotic heart disease of native coronary artery without angina pectoris: Secondary | ICD-10-CM | POA: Diagnosis not present

## 2024-01-11 DIAGNOSIS — E890 Postprocedural hypothyroidism: Secondary | ICD-10-CM | POA: Diagnosis not present

## 2024-01-13 DIAGNOSIS — F322 Major depressive disorder, single episode, severe without psychotic features: Secondary | ICD-10-CM | POA: Diagnosis not present

## 2024-01-13 DIAGNOSIS — J439 Emphysema, unspecified: Secondary | ICD-10-CM | POA: Diagnosis not present

## 2024-01-13 DIAGNOSIS — Z853 Personal history of malignant neoplasm of breast: Secondary | ICD-10-CM | POA: Diagnosis not present

## 2024-01-13 DIAGNOSIS — E785 Hyperlipidemia, unspecified: Secondary | ICD-10-CM | POA: Diagnosis not present

## 2024-02-06 DIAGNOSIS — L720 Epidermal cyst: Secondary | ICD-10-CM | POA: Diagnosis not present

## 2024-02-06 DIAGNOSIS — D224 Melanocytic nevi of scalp and neck: Secondary | ICD-10-CM | POA: Diagnosis not present

## 2024-02-06 DIAGNOSIS — C4401 Basal cell carcinoma of skin of lip: Secondary | ICD-10-CM | POA: Diagnosis not present

## 2024-02-12 DIAGNOSIS — Z853 Personal history of malignant neoplasm of breast: Secondary | ICD-10-CM | POA: Diagnosis not present

## 2024-02-12 DIAGNOSIS — F322 Major depressive disorder, single episode, severe without psychotic features: Secondary | ICD-10-CM | POA: Diagnosis not present

## 2024-02-12 DIAGNOSIS — J439 Emphysema, unspecified: Secondary | ICD-10-CM | POA: Diagnosis not present

## 2024-02-12 DIAGNOSIS — E785 Hyperlipidemia, unspecified: Secondary | ICD-10-CM | POA: Diagnosis not present

## 2024-03-01 IMAGING — MG MM DIGITAL SCREENING BILAT W/ TOMO AND CAD
8 series · 8 of 24 positions shown · non-contrast
Comparison: Previous exam(s).

CLINICAL DATA: Screening.

EXAM:
DIGITAL SCREENING BILATERAL MAMMOGRAM WITH TOMOSYNTHESIS AND CAD
TECHNIQUE: Bilateral screening digital craniocaudal and mediolateral oblique
mammograms were obtained. Bilateral screening digital breast
tomosynthesis was performed. The images were evaluated with
computer-aided detection.

[L CC synth-2D]
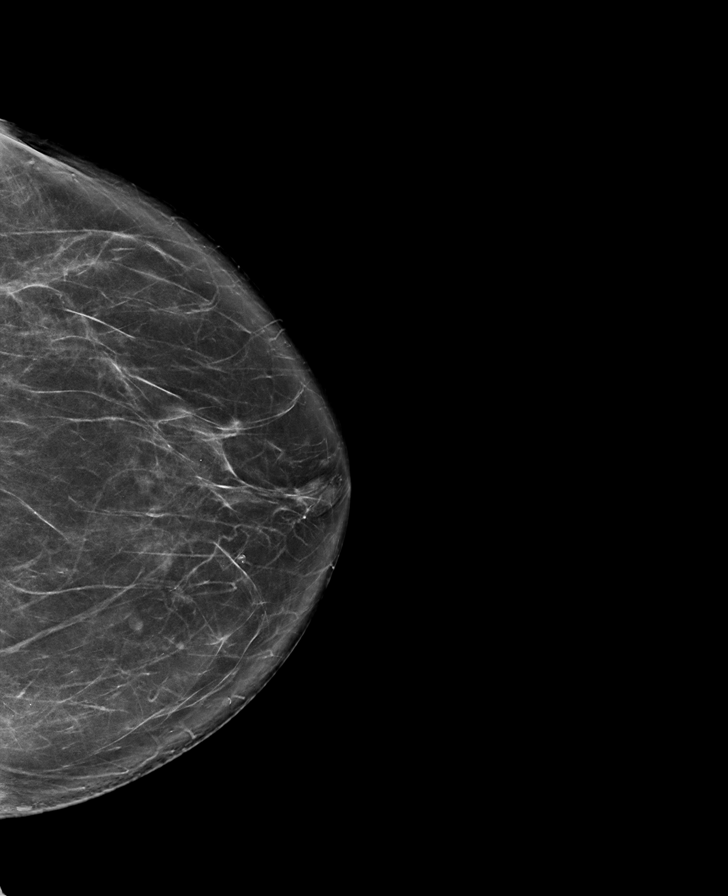

[R MLO synth-2D]
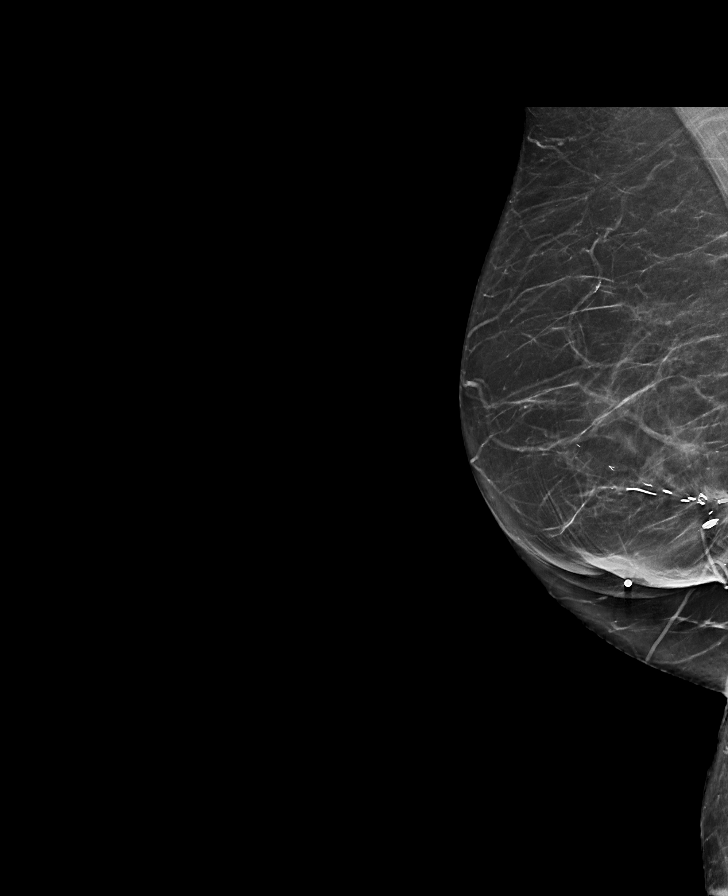

[L MLO synth-2D]
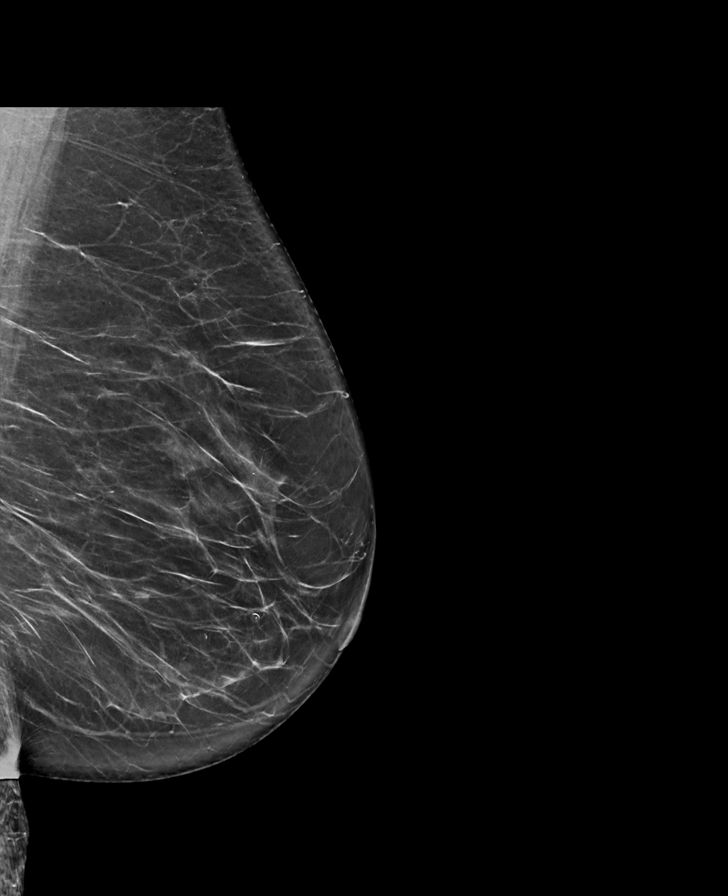

[R CC synth-2D]
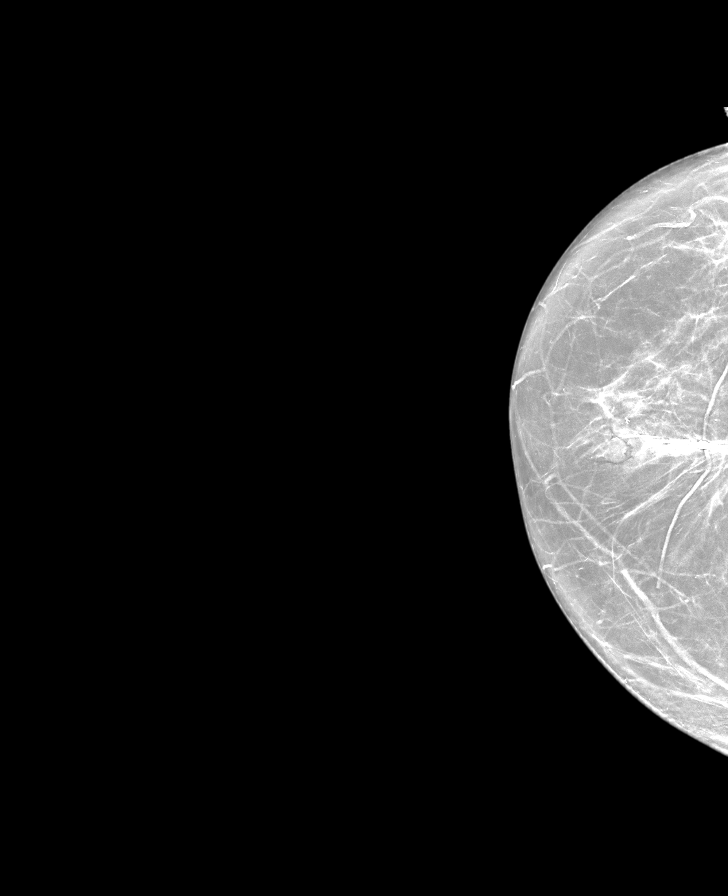

[L MLO tomo · tomo slice 39/78.0]
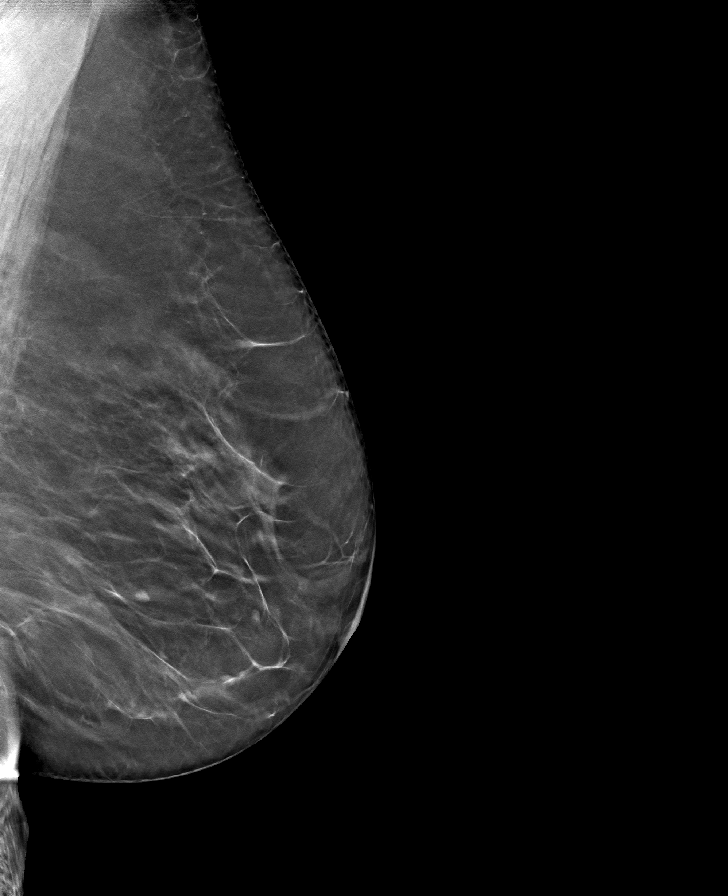

[L CC tomo · tomo slice 41/80.0]
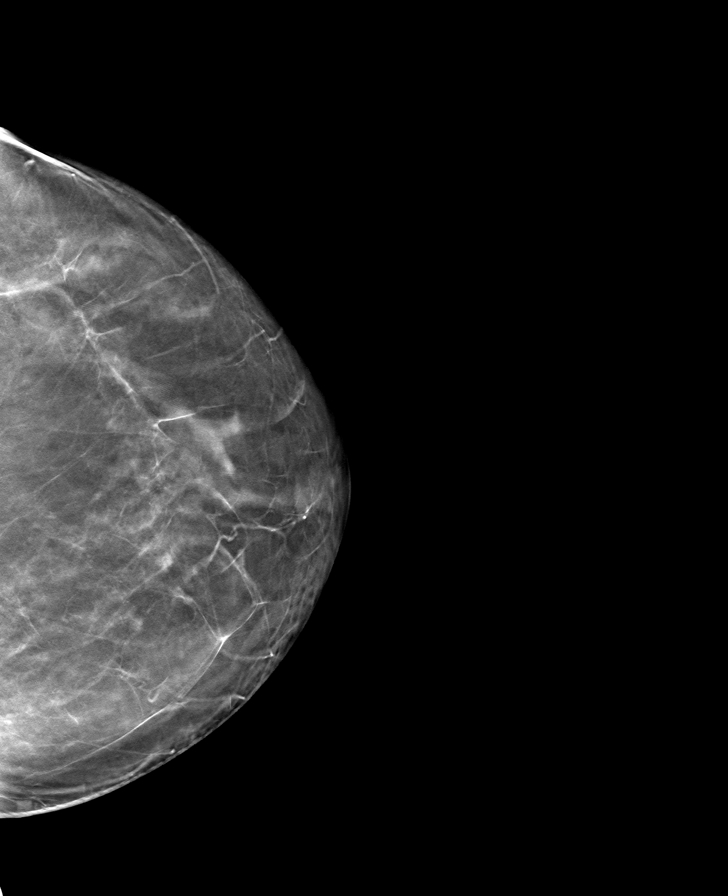

[R MLO tomo · tomo slice 38/75.0]
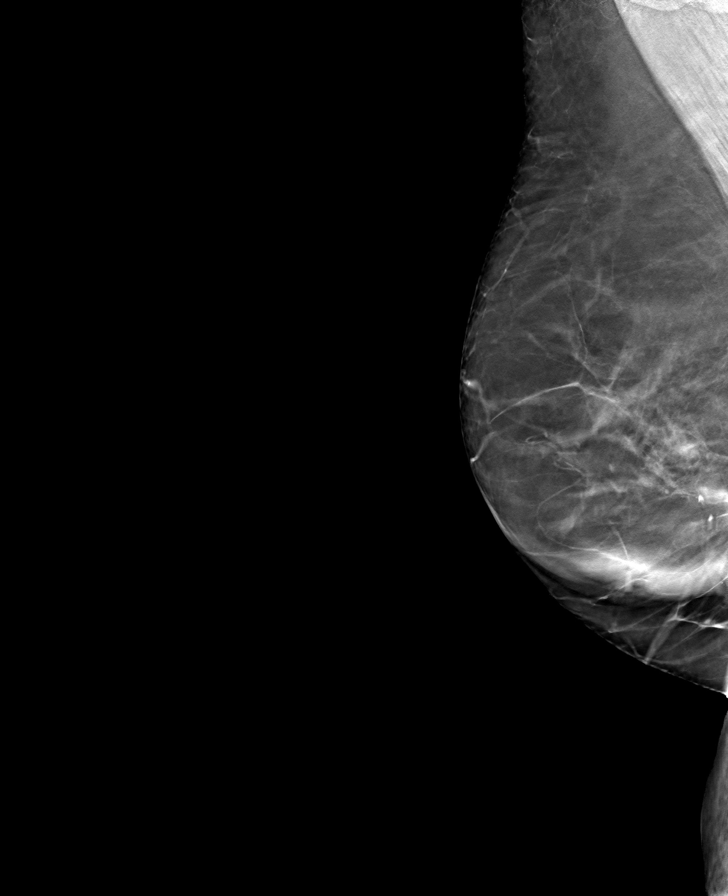

[R CC tomo · tomo slice 35/70.0]
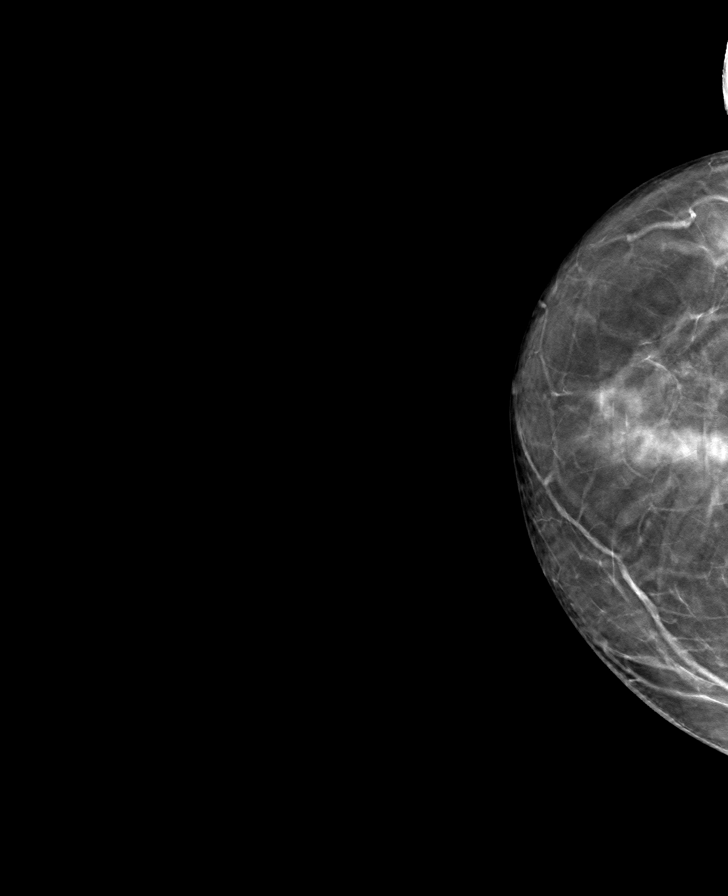

[8 of 24 positions shown; findings below may reference images not displayed]

ACR Breast Density Category b: There are scattered areas of
fibroglandular density.
FINDINGS: There are no findings suspicious for malignancy.
IMPRESSION: No mammographic evidence of malignancy. A result letter of this
screening mammogram will be mailed directly to the patient.

RECOMMENDATION:
Screening mammogram in one year. (Code:51-O-LD2)

BI-RADS CATEGORY  1: Negative.
